# Patient Record
Sex: Female | Born: 1940 | Race: White | Hispanic: No | Marital: Married | Smoking: Never smoker
Health system: Southern US, Community
[De-identification: ages and names within clinical notes are randomized; demographics above are authoritative.]

## PROBLEM LIST (undated history)

## (undated) DIAGNOSIS — J309 Allergic rhinitis, unspecified: Secondary | ICD-10-CM

## (undated) DIAGNOSIS — Z87448 Personal history of other diseases of urinary system: Secondary | ICD-10-CM

## (undated) DIAGNOSIS — R519 Headache, unspecified: Secondary | ICD-10-CM

## (undated) DIAGNOSIS — F419 Anxiety disorder, unspecified: Secondary | ICD-10-CM

## (undated) DIAGNOSIS — I10 Essential (primary) hypertension: Secondary | ICD-10-CM

## (undated) DIAGNOSIS — Z973 Presence of spectacles and contact lenses: Secondary | ICD-10-CM

## (undated) DIAGNOSIS — Z8709 Personal history of other diseases of the respiratory system: Secondary | ICD-10-CM

## (undated) DIAGNOSIS — H269 Unspecified cataract: Secondary | ICD-10-CM

## (undated) DIAGNOSIS — K219 Gastro-esophageal reflux disease without esophagitis: Secondary | ICD-10-CM

## (undated) DIAGNOSIS — C801 Malignant (primary) neoplasm, unspecified: Secondary | ICD-10-CM

## (undated) DIAGNOSIS — J449 Chronic obstructive pulmonary disease, unspecified: Secondary | ICD-10-CM

## (undated) DIAGNOSIS — M1990 Unspecified osteoarthritis, unspecified site: Secondary | ICD-10-CM

## (undated) DIAGNOSIS — F32A Depression, unspecified: Secondary | ICD-10-CM

## (undated) DIAGNOSIS — M8580 Other specified disorders of bone density and structure, unspecified site: Secondary | ICD-10-CM

## (undated) DIAGNOSIS — K5900 Constipation, unspecified: Secondary | ICD-10-CM

## (undated) DIAGNOSIS — R42 Dizziness and giddiness: Secondary | ICD-10-CM

## (undated) DIAGNOSIS — E162 Hypoglycemia, unspecified: Secondary | ICD-10-CM

## (undated) DIAGNOSIS — J45909 Unspecified asthma, uncomplicated: Secondary | ICD-10-CM

## (undated) DIAGNOSIS — N189 Chronic kidney disease, unspecified: Secondary | ICD-10-CM

## (undated) DIAGNOSIS — R0600 Dyspnea, unspecified: Secondary | ICD-10-CM

## (undated) DIAGNOSIS — R51 Headache: Secondary | ICD-10-CM

## (undated) DIAGNOSIS — S83209A Unspecified tear of unspecified meniscus, current injury, unspecified knee, initial encounter: Secondary | ICD-10-CM

## (undated) DIAGNOSIS — F329 Major depressive disorder, single episode, unspecified: Secondary | ICD-10-CM

## (undated) HISTORY — PX: EYE SURGERY: SHX253

## (undated) HISTORY — PX: CARPAL TUNNEL RELEASE: SHX101

## (undated) HISTORY — PX: OTHER SURGICAL HISTORY: SHX169

## (undated) HISTORY — PX: ABDOMINAL HYSTERECTOMY: SHX81

## (undated) HISTORY — PX: BLEPHAROPLASTY: SUR158

## (undated) HISTORY — PX: BASAL CELL CARCINOMA EXCISION: SHX1214

## (undated) HISTORY — PX: COLONOSCOPY: SHX174

## (undated) HISTORY — PX: TONSILLECTOMY: SUR1361

## (undated) HISTORY — PX: NASAL SINUS SURGERY: SHX719

---

## 2004-08-03 ENCOUNTER — Ambulatory Visit: Payer: Self-pay | Admitting: Internal Medicine

## 2005-08-23 ENCOUNTER — Ambulatory Visit: Payer: Self-pay | Admitting: Internal Medicine

## 2005-11-16 ENCOUNTER — Ambulatory Visit: Payer: Self-pay | Admitting: Gastroenterology

## 2006-08-27 ENCOUNTER — Ambulatory Visit: Payer: Self-pay | Admitting: Internal Medicine

## 2007-09-09 ENCOUNTER — Ambulatory Visit: Payer: Self-pay | Admitting: Internal Medicine

## 2008-09-17 ENCOUNTER — Ambulatory Visit: Payer: Self-pay | Admitting: Internal Medicine

## 2009-09-28 ENCOUNTER — Ambulatory Visit: Payer: Self-pay | Admitting: Internal Medicine

## 2010-10-27 ENCOUNTER — Ambulatory Visit: Payer: Self-pay | Admitting: Internal Medicine

## 2011-10-31 ENCOUNTER — Ambulatory Visit: Payer: Self-pay

## 2012-11-18 ENCOUNTER — Ambulatory Visit: Payer: Self-pay

## 2013-12-17 ENCOUNTER — Ambulatory Visit: Payer: Self-pay

## 2014-04-21 DIAGNOSIS — G8929 Other chronic pain: Secondary | ICD-10-CM | POA: Insufficient documentation

## 2014-04-21 DIAGNOSIS — M150 Primary generalized (osteo)arthritis: Secondary | ICD-10-CM | POA: Insufficient documentation

## 2014-12-22 ENCOUNTER — Other Ambulatory Visit: Payer: Self-pay | Admitting: Internal Medicine

## 2014-12-22 DIAGNOSIS — J45909 Unspecified asthma, uncomplicated: Secondary | ICD-10-CM | POA: Insufficient documentation

## 2014-12-22 DIAGNOSIS — Z1239 Encounter for other screening for malignant neoplasm of breast: Secondary | ICD-10-CM

## 2014-12-31 ENCOUNTER — Ambulatory Visit
Admission: RE | Admit: 2014-12-31 | Discharge: 2014-12-31 | Disposition: A | Discharge status: Home / Self Care | Payer: Medicare PPO | Source: Ambulatory Visit | Attending: Internal Medicine | Admitting: Internal Medicine

## 2014-12-31 ENCOUNTER — Other Ambulatory Visit: Payer: Self-pay | Admitting: Internal Medicine

## 2014-12-31 DIAGNOSIS — Z1231 Encounter for screening mammogram for malignant neoplasm of breast: Secondary | ICD-10-CM

## 2014-12-31 DIAGNOSIS — Z1239 Encounter for other screening for malignant neoplasm of breast: Secondary | ICD-10-CM

## 2015-03-30 DIAGNOSIS — G8929 Other chronic pain: Secondary | ICD-10-CM | POA: Insufficient documentation

## 2015-11-28 ENCOUNTER — Other Ambulatory Visit: Payer: Self-pay | Admitting: Internal Medicine

## 2015-11-28 DIAGNOSIS — Z1239 Encounter for other screening for malignant neoplasm of breast: Secondary | ICD-10-CM

## 2016-01-04 ENCOUNTER — Ambulatory Visit: Payer: Medicare PPO

## 2016-01-31 ENCOUNTER — Ambulatory Visit
Admission: RE | Admit: 2016-01-31 | Discharge: 2016-01-31 | Disposition: A | Discharge status: Home / Self Care | Payer: Medicare Other | Source: Ambulatory Visit | Attending: Internal Medicine | Admitting: Internal Medicine

## 2016-01-31 DIAGNOSIS — Z1231 Encounter for screening mammogram for malignant neoplasm of breast: Secondary | ICD-10-CM | POA: Diagnosis not present

## 2016-01-31 DIAGNOSIS — Z1239 Encounter for other screening for malignant neoplasm of breast: Secondary | ICD-10-CM

## 2016-02-28 DIAGNOSIS — N289 Disorder of kidney and ureter, unspecified: Secondary | ICD-10-CM | POA: Insufficient documentation

## 2016-02-28 DIAGNOSIS — F418 Other specified anxiety disorders: Secondary | ICD-10-CM | POA: Insufficient documentation

## 2016-05-31 ENCOUNTER — Other Ambulatory Visit: Payer: Self-pay | Admitting: Neurological Surgery

## 2016-06-14 ENCOUNTER — Encounter (HOSPITAL_COMMUNITY): Payer: Self-pay

## 2016-06-14 ENCOUNTER — Encounter (HOSPITAL_COMMUNITY)
Admission: RE | Admit: 2016-06-14 | Discharge: 2016-06-14 | Disposition: A | Discharge status: Home / Self Care | Payer: Medicare Other | Source: Ambulatory Visit | Attending: Neurological Surgery | Admitting: Neurological Surgery

## 2016-06-14 DIAGNOSIS — Z01812 Encounter for preprocedural laboratory examination: Secondary | ICD-10-CM | POA: Insufficient documentation

## 2016-06-14 DIAGNOSIS — H269 Unspecified cataract: Secondary | ICD-10-CM | POA: Diagnosis not present

## 2016-06-14 DIAGNOSIS — E162 Hypoglycemia, unspecified: Secondary | ICD-10-CM | POA: Insufficient documentation

## 2016-06-14 DIAGNOSIS — I10 Essential (primary) hypertension: Secondary | ICD-10-CM | POA: Insufficient documentation

## 2016-06-14 DIAGNOSIS — J45909 Unspecified asthma, uncomplicated: Secondary | ICD-10-CM | POA: Diagnosis not present

## 2016-06-14 HISTORY — DX: Presence of spectacles and contact lenses: Z97.3

## 2016-06-14 HISTORY — DX: Hypoglycemia, unspecified: E16.2

## 2016-06-14 HISTORY — DX: Unspecified osteoarthritis, unspecified site: M19.90

## 2016-06-14 HISTORY — DX: Headache: R51

## 2016-06-14 HISTORY — DX: Unspecified asthma, uncomplicated: J45.909

## 2016-06-14 HISTORY — DX: Headache, unspecified: R51.9

## 2016-06-14 HISTORY — DX: Personal history of other diseases of the respiratory system: Z87.09

## 2016-06-14 HISTORY — DX: Unspecified cataract: H26.9

## 2016-06-14 HISTORY — DX: Essential (primary) hypertension: I10

## 2016-06-14 LAB — BASIC METABOLIC PANEL
ANION GAP: 8 (ref 5–15)
BUN: 14 mg/dL (ref 6–20)
CALCIUM: 9.7 mg/dL (ref 8.9–10.3)
CO2: 25 mmol/L (ref 22–32)
Chloride: 104 mmol/L (ref 101–111)
Creatinine, Ser: 1.01 mg/dL — ABNORMAL HIGH (ref 0.44–1.00)
GFR, EST NON AFRICAN AMERICAN: 53 mL/min — AB (ref >60)
Glucose, Bld: 98 mg/dL (ref 65–99)
Potassium: 4.2 mmol/L (ref 3.5–5.1)
Sodium: 137 mmol/L (ref 135–145)

## 2016-06-14 LAB — CBC
HCT: 43.6 % (ref 36.0–46.0)
HEMOGLOBIN: 14.4 g/dL (ref 12.0–15.0)
MCH: 29.6 pg (ref 26.0–34.0)
MCHC: 33 g/dL (ref 30.0–36.0)
MCV: 89.5 fL (ref 78.0–100.0)
Platelets: 247 10*3/uL (ref 150–400)
RBC: 4.87 MIL/uL (ref 3.87–5.11)
RDW: 13.3 % (ref 11.5–15.5)
WBC: 6.7 10*3/uL (ref 4.0–10.5)

## 2016-06-14 LAB — ABO/RH: ABO/RH(D): O POS

## 2016-06-14 LAB — TYPE AND SCREEN
ABO/RH(D): O POS
ANTIBODY SCREEN: NEGATIVE

## 2016-06-14 LAB — SURGICAL PCR SCREEN
MRSA, PCR: NEGATIVE
Staphylococcus aureus: NEGATIVE

## 2016-06-14 NOTE — Pre-Procedure Instructions (Signed)
    SHYNIECE SCRIPTER  06/14/2016      [place], [place] - [address] [place] [address] Phone: [phone] Fax: [phone]    Your procedure is scheduled on Tuesday 06/26/2016   Report to [facility] Admitting at  [place].M.  Call this number if you have problems the morning of surgery:  [phone]   Remember:  Do not eat food or drink liquids after midnight. Discontinue taking all NSAIDS ( Aleve (naproxyn), Ibuprofen Advil or Motrin, or aspirin products, herbal medications, vitamin supplements and fish oils 7 days prior to your surgery.   Take these medicines the morning of surgery with A SIP OF WATER  Amlodipine, flonase, gabapentin, zanaflex,  And pain pill and albuterol as needed   Do not wear jewelry, make-up or nail polish.  Do not wear lotions, powders, or perfumes, or deoderant.  Do not shave 48 hours prior to surgery.  .  Do not bring valuables to the hospital.  [facility] is not responsible for any belongings or valuables.  Contacts, dentures or bridgework may not be worn into surgery.  Leave your suitcase in the car.  After surgery it may be brought to your room.  For patients admitted to the hospital, discharge time will be determined by your treatment team.  Patients discharged the day of surgery will not be allowed to drive home.   Name and phone number of your driver: Special instructions  Please read over the following fact sheets that you were given. Pain Booklet, Coughing and Deep Breathing, Blood Transfusion Information, MRSA Information and Surgical Site Infection Prevention

## 2016-06-14 NOTE — Progress Notes (Signed)
PCP- Dr. Glendon Axe Cardiologist - denies  EKG - pt. States she had an EKG in April - requested from Dr. Candiss Norse CXR - 04/05/16 - Care Everywhere  Echo/stress test/cardiac cath - pt denies  Patient denies chest pain and shortness of breath at PAT appointment.    *if EKG tracing is not received, will need EKG DOS*

## 2016-06-25 NOTE — Anesthesia Preprocedure Evaluation (Addendum)
Anesthesia Evaluation  Patient identified by MRN, date of birth, ID band Patient awake    Reviewed: Allergy & Precautions, H&P , NPO status , Patient's Chart, lab work & pertinent test results  History of Anesthesia Complications (+) PONV  Airway Mallampati: II  TM Distance: >3 FB Neck ROM: Full    Dental no notable dental hx. (+) Teeth Intact, Dental Advisory Given   Pulmonary asthma ,    Pulmonary exam normal breath sounds clear to auscultation       Cardiovascular Exercise Tolerance: Good hypertension, Pt. on medications  Rhythm:Regular Rate:Normal     Neuro/Psych  Headaches, negative psych ROS   GI/Hepatic negative GI ROS, Neg liver ROS,   Endo/Other  negative endocrine ROS  Renal/GU negative Renal ROS  negative genitourinary   Musculoskeletal  (+) Arthritis , Osteoarthritis,    Abdominal   Peds  Hematology negative hematology ROS (+)   Anesthesia Other Findings   Reproductive/Obstetrics negative OB ROS                            Anesthesia Physical Anesthesia Plan  ASA: II  Anesthesia Plan: General   Post-op Pain Management:    Induction: Intravenous  PONV Risk Score and Plan: 4 or greater and Ondansetron, Dexamethasone, Propofol, Midazolam and Scopolamine patch - Pre-op  Airway Management Planned: Oral ETT  Additional Equipment:   Intra-op Plan:   Post-operative Plan: Extubation in OR  Informed Consent: I have reviewed the patients History and Physical, chart, labs and discussed the procedure including the risks, benefits and alternatives for the proposed anesthesia with the patient or authorized representative who has indicated his/her understanding and acceptance.   Dental advisory given  Plan Discussed with: CRNA  Anesthesia Plan Comments:        Anesthesia Quick Evaluation

## 2016-06-26 ENCOUNTER — Ambulatory Visit (HOSPITAL_COMMUNITY)
Admission: RE | Disposition: A | Discharge status: Home / Self Care | Payer: Self-pay | Source: Ambulatory Visit | Attending: Neurological Surgery

## 2016-06-26 ENCOUNTER — Ambulatory Visit (HOSPITAL_COMMUNITY): Payer: Medicare Other | Admitting: Anesthesiology

## 2016-06-26 ENCOUNTER — Ambulatory Visit (HOSPITAL_COMMUNITY): Payer: Medicare Other

## 2016-06-26 ENCOUNTER — Encounter (HOSPITAL_COMMUNITY): Payer: Self-pay | Admitting: General Practice

## 2016-06-26 ENCOUNTER — Ambulatory Visit (HOSPITAL_COMMUNITY)
Admission: RE | Admit: 2016-06-26 | Discharge: 2016-06-28 | Disposition: A | Discharge status: Home / Self Care | Payer: Medicare Other | Source: Ambulatory Visit | Attending: Neurological Surgery | Admitting: Neurological Surgery

## 2016-06-26 DIAGNOSIS — Z419 Encounter for procedure for purposes other than remedying health state, unspecified: Secondary | ICD-10-CM

## 2016-06-26 DIAGNOSIS — I10 Essential (primary) hypertension: Secondary | ICD-10-CM | POA: Insufficient documentation

## 2016-06-26 DIAGNOSIS — M4722 Other spondylosis with radiculopathy, cervical region: Secondary | ICD-10-CM | POA: Diagnosis present

## 2016-06-26 DIAGNOSIS — M542 Cervicalgia: Secondary | ICD-10-CM | POA: Diagnosis present

## 2016-06-26 HISTORY — PX: ANTERIOR CERVICAL DECOMP/DISCECTOMY FUSION: SHX1161

## 2016-06-26 SURGERY — ANTERIOR CERVICAL DECOMPRESSION/DISCECTOMY FUSION 3 LEVELS
Anesthesia: General | Site: Spine Cervical

## 2016-06-26 MED ORDER — ONDANSETRON HCL 4 MG PO TABS: 4.0000 mg | ORAL_TABLET | Freq: Four times a day (QID) | ORAL | Status: DC | PRN

## 2016-06-26 MED ORDER — ONDANSETRON HCL 4 MG/2ML IJ SOLN: 4.0000 mg | Freq: Four times a day (QID) | INTRAMUSCULAR | Status: DC | PRN

## 2016-06-26 MED ORDER — BUPIVACAINE-EPINEPHRINE (PF) 0.5% -1:200000 IJ SOLN
INTRAMUSCULAR | Status: DC | PRN
  Administered 2016-06-26: 7.5 mL

## 2016-06-26 MED ORDER — FLUTICASONE PROPIONATE 50 MCG/ACT NA SUSP
1.0000 | Freq: Every day | NASAL | Status: DC
  Filled 2016-06-26: qty 16

## 2016-06-26 MED ORDER — DIAZEPAM 5 MG PO TABS
5.0000 mg | ORAL_TABLET | Freq: Four times a day (QID) | ORAL | Status: DC | PRN
  Administered 2016-06-27: 5 mg via ORAL
  Filled 2016-06-26: qty 1

## 2016-06-26 MED ORDER — 0.9 % SODIUM CHLORIDE (POUR BTL) OPTIME
TOPICAL | Status: DC | PRN
  Administered 2016-06-26: 1000 mL

## 2016-06-26 MED ORDER — THROMBIN 5000 UNITS EX SOLR
OROMUCOSAL | Status: DC | PRN
  Administered 2016-06-26 (×3): via TOPICAL

## 2016-06-26 MED ORDER — DOCUSATE SODIUM 100 MG PO CAPS
100.0000 mg | ORAL_CAPSULE | Freq: Two times a day (BID) | ORAL | Status: DC
  Administered 2016-06-26 – 2016-06-28 (×5): 100 mg via ORAL
  Filled 2016-06-26 (×5): qty 1

## 2016-06-26 MED ORDER — PROPOFOL 10 MG/ML IV BOLUS
INTRAVENOUS | Status: AC
  Filled 2016-06-26: qty 20

## 2016-06-26 MED ORDER — MENTHOL 3 MG MT LOZG
1.0000 | LOZENGE | OROMUCOSAL | Status: DC | PRN
  Filled 2016-06-26: qty 9

## 2016-06-26 MED ORDER — MIDAZOLAM HCL 5 MG/5ML IJ SOLN
INTRAMUSCULAR | Status: DC | PRN
  Administered 2016-06-26: 2 mg via INTRAVENOUS

## 2016-06-26 MED ORDER — METHOCARBAMOL 750 MG PO TABS: 750.0000 mg | ORAL_TABLET | Freq: Four times a day (QID) | ORAL | Status: DC

## 2016-06-26 MED ORDER — THROMBIN 5000 UNITS EX SOLR
CUTANEOUS | Status: AC
  Filled 2016-06-26: qty 5000

## 2016-06-26 MED ORDER — SODIUM CHLORIDE 0.9% FLUSH
3.0000 mL | Freq: Two times a day (BID) | INTRAVENOUS | Status: DC
  Administered 2016-06-26 – 2016-06-27 (×3): 3 mL via INTRAVENOUS

## 2016-06-26 MED ORDER — ALBUMIN HUMAN 5 % IV SOLN
INTRAVENOUS | Status: AC
  Filled 2016-06-26: qty 250

## 2016-06-26 MED ORDER — CHLORHEXIDINE GLUCONATE CLOTH 2 % EX PADS: 6.0000 | MEDICATED_PAD | Freq: Once | CUTANEOUS | Status: DC

## 2016-06-26 MED ORDER — LORATADINE 10 MG PO TABS
10.0000 mg | ORAL_TABLET | Freq: Every day | ORAL | Status: DC
  Administered 2016-06-27 – 2016-06-28 (×2): 10 mg via ORAL
  Filled 2016-06-26 (×2): qty 1

## 2016-06-26 MED ORDER — EPHEDRINE SULFATE 50 MG/ML IJ SOLN
INTRAMUSCULAR | Status: DC | PRN
  Administered 2016-06-26: 20 mg via INTRAVENOUS
  Administered 2016-06-26: 10 mg via INTRAVENOUS

## 2016-06-26 MED ORDER — DEXAMETHASONE SODIUM PHOSPHATE 10 MG/ML IJ SOLN
INTRAMUSCULAR | Status: DC | PRN
  Administered 2016-06-26: 4 mg via INTRAVENOUS
  Administered 2016-06-26: 10 mg via INTRAVENOUS

## 2016-06-26 MED ORDER — GABAPENTIN 300 MG PO CAPS
600.0000 mg | ORAL_CAPSULE | Freq: Three times a day (TID) | ORAL | Status: DC
  Administered 2016-06-26 – 2016-06-28 (×5): 600 mg via ORAL
  Filled 2016-06-26 (×5): qty 2

## 2016-06-26 MED ORDER — SUGAMMADEX SODIUM 200 MG/2ML IV SOLN
INTRAVENOUS | Status: DC | PRN
  Administered 2016-06-26: 150 mg via INTRAVENOUS

## 2016-06-26 MED ORDER — SODIUM CHLORIDE 0.9 % IV SOLN
INTRAVENOUS | Status: DC
  Administered 2016-06-26: 13:00:00 via INTRAVENOUS

## 2016-06-26 MED ORDER — THROMBIN 20000 UNITS EX SOLR
CUTANEOUS | Status: DC | PRN
  Administered 2016-06-26: 09:00:00 via TOPICAL

## 2016-06-26 MED ORDER — PHENOL 1.4 % MT LIQD: 1.0000 | OROMUCOSAL | Status: DC | PRN

## 2016-06-26 MED ORDER — FLEET ENEMA 7-19 GM/118ML RE ENEM: 1.0000 | ENEMA | Freq: Once | RECTAL | Status: DC | PRN

## 2016-06-26 MED ORDER — LACTATED RINGERS IV SOLN
INTRAVENOUS | Status: DC | PRN
  Administered 2016-06-26 (×2): via INTRAVENOUS

## 2016-06-26 MED ORDER — CEFAZOLIN SODIUM-DEXTROSE 1-4 GM/50ML-% IV SOLN
1.0000 g | Freq: Three times a day (TID) | INTRAVENOUS | Status: DC
  Administered 2016-06-26 – 2016-06-27 (×5): 1 g via INTRAVENOUS
  Filled 2016-06-26 (×4): qty 50

## 2016-06-26 MED ORDER — ACETAMINOPHEN 500 MG PO TABS
1000.0000 mg | ORAL_TABLET | Freq: Four times a day (QID) | ORAL | Status: DC
  Administered 2016-06-26 – 2016-06-27 (×5): 1000 mg via ORAL
  Filled 2016-06-26 (×5): qty 2

## 2016-06-26 MED ORDER — LIDOCAINE-EPINEPHRINE 2 %-1:100000 IJ SOLN
INTRAMUSCULAR | Status: AC
  Filled 2016-06-26: qty 1

## 2016-06-26 MED ORDER — ROCURONIUM BROMIDE 10 MG/ML (PF) SYRINGE
PREFILLED_SYRINGE | INTRAVENOUS | Status: AC
  Filled 2016-06-26: qty 5

## 2016-06-26 MED ORDER — OXYCODONE HCL ER 20 MG PO T12A
20.0000 mg | EXTENDED_RELEASE_TABLET | Freq: Two times a day (BID) | ORAL | Status: DC
  Administered 2016-06-26: 20 mg via ORAL
  Filled 2016-06-26: qty 1

## 2016-06-26 MED ORDER — HYDROMORPHONE HCL 1 MG/ML IJ SOLN
INTRAMUSCULAR | Status: AC
  Filled 2016-06-26: qty 0.5

## 2016-06-26 MED ORDER — PROPOFOL 10 MG/ML IV BOLUS
INTRAVENOUS | Status: DC | PRN
Start: 2016-06-26 — End: 2016-06-26
  Administered 2016-06-26: 110 mg via INTRAVENOUS

## 2016-06-26 MED ORDER — FENTANYL CITRATE (PF) 100 MCG/2ML IJ SOLN
INTRAMUSCULAR | Status: DC | PRN
  Administered 2016-06-26 (×2): 50 ug via INTRAVENOUS
  Administered 2016-06-26 (×2): 25 ug via INTRAVENOUS

## 2016-06-26 MED ORDER — BUPIVACAINE-EPINEPHRINE (PF) 0.5% -1:200000 IJ SOLN
INTRAMUSCULAR | Status: AC
  Filled 2016-06-26: qty 30

## 2016-06-26 MED ORDER — VITAMIN D 1000 UNITS PO TABS
1000.0000 [IU] | ORAL_TABLET | Freq: Every day | ORAL | Status: DC
  Administered 2016-06-27 – 2016-06-28 (×2): 1000 [IU] via ORAL
  Filled 2016-06-26 (×2): qty 1

## 2016-06-26 MED ORDER — THROMBIN 20000 UNITS EX SOLR
CUTANEOUS | Status: AC
  Filled 2016-06-26: qty 20000

## 2016-06-26 MED ORDER — PANTOPRAZOLE SODIUM 40 MG IV SOLR: 40.0000 mg | Freq: Every day | INTRAVENOUS | Status: DC

## 2016-06-26 MED ORDER — ALBUMIN HUMAN 5 % IV SOLN
12.5000 g | Freq: Once | INTRAVENOUS | Status: AC
  Administered 2016-06-26: 12.5 g via INTRAVENOUS

## 2016-06-26 MED ORDER — METHOCARBAMOL 750 MG PO TABS
750.0000 mg | ORAL_TABLET | Freq: Four times a day (QID) | ORAL | Status: DC
  Administered 2016-06-26: 750 mg via ORAL
  Filled 2016-06-26: qty 1

## 2016-06-26 MED ORDER — AMLODIPINE BESYLATE 5 MG PO TABS
5.0000 mg | ORAL_TABLET | Freq: Every day | ORAL | Status: DC
  Filled 2016-06-26 (×2): qty 1

## 2016-06-26 MED ORDER — CEFAZOLIN SODIUM-DEXTROSE 2-4 GM/100ML-% IV SOLN
INTRAVENOUS | Status: AC
  Filled 2016-06-26: qty 100

## 2016-06-26 MED ORDER — ROCURONIUM BROMIDE 100 MG/10ML IV SOLN
INTRAVENOUS | Status: DC | PRN
  Administered 2016-06-26: 50 mg via INTRAVENOUS
  Administered 2016-06-26 (×4): 10 mg via INTRAVENOUS

## 2016-06-26 MED ORDER — ALBUTEROL SULFATE (2.5 MG/3ML) 0.083% IN NEBU
3.0000 mL | INHALATION_SOLUTION | RESPIRATORY_TRACT | Status: DC | PRN
  Administered 2016-06-27: 3 mL via RESPIRATORY_TRACT
  Filled 2016-06-26: qty 3

## 2016-06-26 MED ORDER — SODIUM CHLORIDE 0.9 % IR SOLN
Status: DC | PRN
  Administered 2016-06-26: 09:00:00

## 2016-06-26 MED ORDER — METHOCARBAMOL 750 MG PO TABS
750.0000 mg | ORAL_TABLET | Freq: Four times a day (QID) | ORAL | Status: DC
  Administered 2016-06-27 – 2016-06-28 (×4): 750 mg via ORAL
  Filled 2016-06-26 (×4): qty 1

## 2016-06-26 MED ORDER — MIDAZOLAM HCL 2 MG/2ML IJ SOLN
INTRAMUSCULAR | Status: AC
  Filled 2016-06-26: qty 2

## 2016-06-26 MED ORDER — DEXAMETHASONE SODIUM PHOSPHATE 4 MG/ML IJ SOLN
2.0000 mg | Freq: Three times a day (TID) | INTRAMUSCULAR | Status: AC
  Administered 2016-06-26 – 2016-06-27 (×3): 2 mg via INTRAVENOUS
  Filled 2016-06-26 (×3): qty 1

## 2016-06-26 MED ORDER — CEFAZOLIN SODIUM-DEXTROSE 2-4 GM/100ML-% IV SOLN
2.0000 g | INTRAVENOUS | Status: AC
  Administered 2016-06-26: 2 g via INTRAVENOUS

## 2016-06-26 MED ORDER — CELECOXIB 200 MG PO CAPS
200.0000 mg | ORAL_CAPSULE | Freq: Two times a day (BID) | ORAL | Status: DC
  Administered 2016-06-26 – 2016-06-28 (×5): 200 mg via ORAL
  Filled 2016-06-26 (×5): qty 1

## 2016-06-26 MED ORDER — SCOPOLAMINE 1 MG/3DAYS TD PT72
MEDICATED_PATCH | TRANSDERMAL | Status: AC
  Filled 2016-06-26: qty 1

## 2016-06-26 MED ORDER — BISACODYL 10 MG RE SUPP: 10.0000 mg | Freq: Every day | RECTAL | Status: DC | PRN

## 2016-06-26 MED ORDER — LIDOCAINE HCL (CARDIAC) 20 MG/ML IV SOLN
INTRAVENOUS | Status: DC | PRN
  Administered 2016-06-26: 60 mg via INTRAVENOUS

## 2016-06-26 MED ORDER — FENTANYL CITRATE (PF) 250 MCG/5ML IJ SOLN
INTRAMUSCULAR | Status: AC
  Filled 2016-06-26: qty 5

## 2016-06-26 MED ORDER — LIDOCAINE-EPINEPHRINE 2 %-1:100000 IJ SOLN
INTRAMUSCULAR | Status: DC | PRN
Start: 2016-06-26 — End: 2016-06-26
  Administered 2016-06-26: 7.5 mL

## 2016-06-26 MED ORDER — OXYCODONE HCL 5 MG PO TABS
5.0000 mg | ORAL_TABLET | ORAL | Status: DC | PRN
  Administered 2016-06-26 – 2016-06-27 (×3): 10 mg via ORAL
  Filled 2016-06-26 (×3): qty 2

## 2016-06-26 MED ORDER — PANTOPRAZOLE SODIUM 40 MG PO TBEC
40.0000 mg | DELAYED_RELEASE_TABLET | Freq: Every day | ORAL | Status: DC
Start: 2016-06-26 — End: 2016-06-28
  Administered 2016-06-26 – 2016-06-27 (×2): 40 mg via ORAL
  Filled 2016-06-26 (×2): qty 1

## 2016-06-26 MED ORDER — PHENYLEPHRINE HCL 10 MG/ML IJ SOLN
INTRAVENOUS | Status: DC | PRN
  Administered 2016-06-26: 50 ug/min via INTRAVENOUS

## 2016-06-26 MED ORDER — HYDROXYZINE HCL 50 MG/ML IM SOLN
50.0000 mg | Freq: Four times a day (QID) | INTRAMUSCULAR | Status: DC | PRN
  Administered 2016-06-26: 50 mg via INTRAMUSCULAR
  Filled 2016-06-26: qty 1

## 2016-06-26 MED ORDER — ZOLPIDEM TARTRATE 5 MG PO TABS: 5.0000 mg | ORAL_TABLET | Freq: Every evening | ORAL | Status: DC | PRN

## 2016-06-26 MED ORDER — SCOPOLAMINE 1 MG/3DAYS TD PT72
MEDICATED_PATCH | TRANSDERMAL | Status: DC | PRN
  Administered 2016-06-26: 1 via TRANSDERMAL

## 2016-06-26 MED ORDER — SODIUM CHLORIDE 0.9% FLUSH: 3.0000 mL | INTRAVENOUS | Status: DC | PRN

## 2016-06-26 MED ORDER — HYDROMORPHONE HCL 1 MG/ML IJ SOLN
0.2500 mg | INTRAMUSCULAR | Status: DC | PRN
  Administered 2016-06-26: 0.25 mg via INTRAVENOUS

## 2016-06-26 MED ORDER — SENNA 8.6 MG PO TABS
1.0000 | ORAL_TABLET | Freq: Two times a day (BID) | ORAL | Status: DC
  Administered 2016-06-26 – 2016-06-27 (×2): 8.6 mg via ORAL
  Filled 2016-06-26 (×2): qty 1

## 2016-06-26 MED ORDER — LIDOCAINE 2% (20 MG/ML) 5 ML SYRINGE
INTRAMUSCULAR | Status: AC
  Filled 2016-06-26: qty 5

## 2016-06-26 SURGICAL SUPPLY — 70 items
BIT DRILL INVIZIA (BIT) ×1 IMPLANT
BONE EQUIVA 5CC (Bone Implant) ×3 IMPLANT
BUR MATCHSTICK NEURO 3.0 LAGG (BURR) ×3 IMPLANT
CANISTER SUCT 3000ML PPV (MISCELLANEOUS) ×3 IMPLANT
CARTRIDGE OIL MAESTRO DRILL (MISCELLANEOUS) ×1 IMPLANT
CHLORAPREP W/TINT 26ML (MISCELLANEOUS) ×3 IMPLANT
DERMABOND ADVANCED (GAUZE/BANDAGES/DRESSINGS) ×2
DERMABOND ADVANCED .7 DNX12 (GAUZE/BANDAGES/DRESSINGS) ×1 IMPLANT
DIFFUSER DRILL AIR PNEUMATIC (MISCELLANEOUS) ×3 IMPLANT
DRAPE C-ARM 42X72 X-RAY (DRAPES) ×6 IMPLANT
DRAPE HALF SHEET 40X57 (DRAPES) ×3 IMPLANT
DRAPE LAPAROTOMY 100X72 PEDS (DRAPES) ×3 IMPLANT
DRAPE MICROSCOPE LEICA (MISCELLANEOUS) ×3 IMPLANT
DRAPE POUCH INSTRU U-SHP 10X18 (DRAPES) ×3 IMPLANT
DRAPE SHEET LG 3/4 BI-LAMINATE (DRAPES) ×3 IMPLANT
DRILL BIT INVIZIA (BIT) ×3
DRSG OPSITE 4X5.5 SM (GAUZE/BANDAGES/DRESSINGS) ×3 IMPLANT
ELECT COATED BLADE 2.86 ST (ELECTRODE) ×3 IMPLANT
ELECT REM PT RETURN 9FT ADLT (ELECTROSURGICAL) ×3
ELECTRODE REM PT RTRN 9FT ADLT (ELECTROSURGICAL) ×1 IMPLANT
EVACUATOR 1/8 PVC DRAIN (DRAIN) IMPLANT
GAUZE SPONGE 4X4 12PLY STRL (GAUZE/BANDAGES/DRESSINGS) IMPLANT
GAUZE SPONGE 4X4 16PLY XRAY LF (GAUZE/BANDAGES/DRESSINGS) IMPLANT
GLOVE BIO SURGEON STRL SZ7 (GLOVE) IMPLANT
GLOVE BIOGEL PI IND STRL 7.0 (GLOVE) IMPLANT
GLOVE BIOGEL PI IND STRL 7.5 (GLOVE) ×3 IMPLANT
GLOVE BIOGEL PI IND STRL 8.5 (GLOVE) ×1 IMPLANT
GLOVE BIOGEL PI INDICATOR 7.0 (GLOVE)
GLOVE BIOGEL PI INDICATOR 7.5 (GLOVE) ×6
GLOVE BIOGEL PI INDICATOR 8.5 (GLOVE) ×2
GLOVE ECLIPSE 8.0 STRL XLNG CF (GLOVE) ×3 IMPLANT
GLOVE SS BIOGEL STRL SZ 7.5 (GLOVE) ×3 IMPLANT
GLOVE SUPERSENSE BIOGEL SZ 7.5 (GLOVE) ×6
GOWN SPEC L3 XXLG W/TWL (GOWN DISPOSABLE) ×3 IMPLANT
GOWN STRL REUS W/ TWL LRG LVL3 (GOWN DISPOSABLE) ×1 IMPLANT
GOWN STRL REUS W/ TWL XL LVL3 (GOWN DISPOSABLE) ×1 IMPLANT
GOWN STRL REUS W/TWL LRG LVL3 (GOWN DISPOSABLE) ×2
GOWN STRL REUS W/TWL XL LVL3 (GOWN DISPOSABLE) ×2
HEMOSTAT POWDER KIT SURGIFOAM (HEMOSTASIS) ×9 IMPLANT
INTERBODY TM 11X14X5-7DEG ANG (Metal Cage) ×9 IMPLANT
INTERBODY TM 11X14X7-7DEG ANG (Metal Cage) ×3 IMPLANT
KIT BASIN OR (CUSTOM PROCEDURE TRAY) ×3 IMPLANT
KIT ROOM TURNOVER OR (KITS) ×3 IMPLANT
NEEDLE HYPO 21X1.5 SAFETY (NEEDLE) ×3 IMPLANT
NEEDLE SPNL 18GX3.5 QUINCKE PK (NEEDLE) ×3 IMPLANT
NS IRRIG 1000ML POUR BTL (IV SOLUTION) ×3 IMPLANT
OIL CARTRIDGE MAESTRO DRILL (MISCELLANEOUS) ×3
PACK LAMINECTOMY NEURO (CUSTOM PROCEDURE TRAY) ×3 IMPLANT
PAD ARMBOARD 7.5X6 YLW CONV (MISCELLANEOUS) ×6 IMPLANT
PATTIES SURGICAL .5X1.5 (GAUZE/BANDAGES/DRESSINGS) ×3 IMPLANT
PIN DISTRACTION 14MM (PIN) ×6 IMPLANT
PIN THREADED TEMP FIX INVIZIA (PIN) ×3 IMPLANT
PLATE INVIZIA 4 LEV 76MM (Plate) ×3 IMPLANT
RUBBERBAND STERILE (MISCELLANEOUS) ×6 IMPLANT
SCREW 14MM (Screw) ×16 IMPLANT
SCREW BN 14X4.2XST VA NS (Screw) ×8 IMPLANT
SCREW FIXED ST 14MM (Screw) ×6 IMPLANT
SPONGE INTESTINAL PEANUT (DISPOSABLE) ×3 IMPLANT
SPONGE SURGIFOAM ABS GEL 100 (HEMOSTASIS) ×3 IMPLANT
STAPLER VISISTAT 35W (STAPLE) ×3 IMPLANT
STOCKINETTE 6  STRL (DRAPES) ×2
STOCKINETTE 6 STRL (DRAPES) ×1 IMPLANT
SUT STRATAFIX MNCRL+ 3-0 PS-2 (SUTURE)
SUT STRATAFIX MONOCRYL 3-0 (SUTURE)
SUT VIC AB 3-0 SH 8-18 (SUTURE) ×3 IMPLANT
SUTURE STRATFX MNCRL+ 3-0 PS-2 (SUTURE) IMPLANT
TOWEL GREEN STERILE (TOWEL DISPOSABLE) ×3 IMPLANT
TOWEL GREEN STERILE FF (TOWEL DISPOSABLE) ×6 IMPLANT
TRAY FOLEY CATH SILVER 16FR (SET/KITS/TRAYS/PACK) ×3 IMPLANT
WATER STERILE IRR 1000ML POUR (IV SOLUTION) ×3 IMPLANT

## 2016-06-26 NOTE — Progress Notes (Signed)
Orthopedic Tech Progress Note Patient Details:  Christine Ballard [birth year] [id number]  Ortho Devices Type of Ortho Device: Soft collar Ortho Device/Splint Interventions: Application   [place] Pink 06/26/2016, 12:35 PM

## 2016-06-26 NOTE — Anesthesia Postprocedure Evaluation (Signed)
Anesthesia Post Note  Patient: Elon Spanner Ritzel  Procedure(s) Performed: Procedure(s) (LRB): Cervical Two-Three, Cervical Three-Four, Cervical Four-Five, Cervical Five-Six Anterior Discectomy with Fusion and Plate Fixation (N/A)     Patient location during evaluation: PACU Anesthesia Type: General Level of consciousness: awake and alert Pain management: pain level controlled Vital Signs Assessment: post-procedure vital signs reviewed and stable Respiratory status: spontaneous breathing, nonlabored ventilation, respiratory function stable and patient connected to nasal cannula oxygen Cardiovascular status: blood pressure returned to baseline and stable Postop Assessment: no signs of nausea or vomiting Anesthetic complications: no    Last Vitals:  Vitals:   06/26/16 1400 06/26/16 1415  BP: 110/66 121/62  Pulse: 68 67  Resp: 16   Temp:  36.7 C    Last Pain:  Vitals:   06/26/16 1345  TempSrc:   PainSc: Asleep                 Trisha Ken,W. EDMOND

## 2016-06-26 NOTE — Transfer of Care (Signed)
Immediate Anesthesia Transfer of Care Note  Patient: Elon Spanner Clenney  Procedure(s) Performed: Procedure(s) with comments: Cervical Two-Three, Cervical Three-Four, Cervical Four-Five, Cervical Five-Six Anterior Discectomy with Fusion and Plate Fixation (N/A) - C3-6 Anterior discectomy with fusion and plate fixation  Patient Location: PACU  Anesthesia Type:General  Level of Consciousness: awake  Airway & Oxygen Therapy: Patient Spontanous Breathing and Patient connected to nasal cannula oxygen  Post-op Assessment: Report given to RN and Post -op Vital signs reviewed and stable  Post vital signs: Reviewed and stable  Last Vitals:  Vitals:   06/26/16 0648  BP: (!) 142/65  Pulse: (!) 55  Resp: 18  Temp: 36.7 C    Last Pain:  Vitals:   06/26/16 0648  TempSrc: Oral  PainSc: 2       Patients Stated Pain Goal: 2 (44/92/01 0071)  Complications: No apparent anesthesia complications

## 2016-06-26 NOTE — Brief Op Note (Signed)
06/26/2016  11:39 AM  PATIENT:  Christine Ballard  76 y.o. female  PRE-OPERATIVE DIAGNOSIS:  Other spondylosis with radiculopathy, Cervical region  POST-OPERATIVE DIAGNOSIS:  Other spondylosis with radiculopathy, Cervical region  PROCEDURE:  Procedure(s) with comments: Cervical Two-Three, Cervical Three-Four, Cervical Four-Five, Cervical Five-Six Anterior Discectomy with Fusion and Plate Fixation (N/A) - C3-6 Anterior discectomy with fusion and plate fixation  SURGEON:  Surgeon(s) and Role:    * Everest Brod, Kevan Ny, MD - Primary    Kristeen Miss, MD - Assisting  PHYSICIAN ASSISTANT:   ASSISTANTS: Kristeen Miss, MD  ANESTHESIA:   general  EBL:  Total I/O In: 1000 [I.V.:1000] Out: 900 [Urine:900]  BLOOD ADMINISTERED:none  DRAINS: JP  LOCAL MEDICATIONS USED:  MARCAINE    and LIDOCAINE   SPECIMEN:  No Specimen  DISPOSITION OF SPECIMEN:  N/A  COUNTS:  YES  TOURNIQUET:  * No tourniquets in log *  DICTATION: .Dragon Dictation  PLAN OF CARE: Admit to inpatient   PATIENT DISPOSITION:  PACU - hemodynamically stable.   Delay start of Pharmacological VTE agent (>24hrs) due to surgical blood loss or risk of bleeding: yes

## 2016-06-26 NOTE — H&P (Signed)
CC:  No chief complaint on file. Neck and arm pain  HPI: Christine Ballard is a 76 y.o. female with cervical radiculopathy.  She complains of neck pain and left shoulder pain.  She has more left sided pain than right sided pain.    PMH: Past Medical History:  Diagnosis Date  . Allergy-induced asthma   . Arthritis   . Cataracts, bilateral    "small"  . Headache   . History of bronchitis    "about twice a year"  . Hypertension   . Hypoglycemia   . Wears glasses     PSH: Past Surgical History:  Procedure Laterality Date  . ABDOMINAL HYSTERECTOMY    . BASAL CELL CARCINOMA EXCISION     on back  . BLEPHAROPLASTY    . CARPAL TUNNEL RELEASE Bilateral   . COLONOSCOPY    . NASAL SINUS SURGERY    . TONSILLECTOMY      SH: Social History  Substance Use Topics  . Smoking status: Never Smoker  . Smokeless tobacco: Never Used  . Alcohol use No    MEDS: Prior to Admission medications   Medication Sig Start Date End Date Taking? Authorizing Provider  acetaminophen (TYLENOL) 650 MG CR tablet Take 650 mg by mouth every 8 (eight) hours as needed for pain.   Yes [provider]  amLODipine (NORVASC) 5 MG tablet TAKE 1 TABLET BY MOUTH DAILY. IF SYSTOLIC BP GREATER THAN 264 IN AFTERNOON TAKE EXTRA 1/2 TABLET. 08/19/15  Yes [provider]  cetirizine (ZYRTEC) 10 MG tablet Take 10 mg by mouth daily as needed for allergies.   Yes [provider]  Cholecalciferol (VITAMIN D-1000 MAX ST) 1000 units tablet Take 1,000 Units by mouth daily.    Yes [provider]  fluticasone (FLONASE) 50 MCG/ACT nasal spray Place 1 spray into the nose daily as needed for allergies.  08/19/15  Yes [provider]  gabapentin (NEURONTIN) 300 MG capsule Take 300 mg by mouth 3 (three) times daily.   Yes [provider]  HYDROcodone-acetaminophen (NORCO/VICODIN) 5-325 MG tablet Take 0.5 tablets by mouth 2 (two) times daily as needed for pain. 02/03/16  Yes [provider]  tiZANidine (ZANAFLEX) 2 MG tablet Take 2 mg by mouth every 8 (eight) hours as needed for muscle spasms.   Yes [provider]  albuterol (PROVENTIL HFA;VENTOLIN HFA) 108 (90 Base) MCG/ACT inhaler Inhale 2 puffs into the lungs every 4 (four) hours as needed for wheezing or shortness of breath.  11/28/15 11/27/16  [provider]    ALLERGY: Allergies  Allergen Reactions  . Bisacodyl Other (See Comments)    Extreme cramping.  . Ciprofloxacin Other (See Comments)    Muscle pain and stiffness  . Penicillins Rash    Has patient had a PCN reaction causing immediate rash, facial/tongue/throat swelling, SOB or lightheadedness with hypotension: No Has patient had a PCN reaction causing severe rash involving mucus membranes or skin necrosis: No Has patient had a PCN reaction that required hospitalization: No Has patient had a PCN reaction occurring within the last 10 years: No If all of the above answers are "NO", then may proceed with Cephalosporin use.   . Sulfa Antibiotics Rash    ROS: ROS  NEUROLOGIC EXAM: Awake, alert, oriented Memory and concentration grossly intact Speech fluent, appropriate CN grossly intact Motor exam: Upper Extremities Deltoid Bicep Tricep Grip  Right 5/5 5/5 5/5 5/5  Left 5/5 4/5 5/5 5/5   Lower Extremity IP  Quad PF DF EHL  Right 5/5 5/5 5/5 5/5 5/5  Left 5/5 5/5 5/5 5/5 5/5   Sensation grossly intact to LT  IMAGING: No new imaging  IMPRESSION: - 76 y.o. female with cervical spondylosis with radiculopathy.  Weakness as above.  PLAN: - C3-6 anterior discectomy with fusion and plate fixation - We have discussed the risks, benefits, and alternatives to surgery and she wishes to proceed.

## 2016-06-26 NOTE — Anesthesia Procedure Notes (Signed)
Procedure Name: Intubation Date/Time: 06/26/2016 7:44 AM Performed by: Manus Gunning, Remington Highbaugh J Pre-anesthesia Checklist: Patient identified, Emergency Drugs available, Suction available, Patient being monitored and Timeout performed Patient Re-evaluated:Patient Re-evaluated prior to inductionOxygen Delivery Method: Circle system utilized Preoxygenation: Pre-oxygenation with 100% oxygen Intubation Type: IV induction Ventilation: Mask ventilation without difficulty Laryngoscope Size: Glidescope Tube size: 7.0 mm Number of attempts: 1 Placement Confirmation: ETT inserted through vocal cords under direct vision,  positive ETCO2 and breath sounds checked- equal and bilateral Secured at: 21 cm Tube secured with: Tape Dental Injury: Teeth and Oropharynx as per pre-operative assessment

## 2016-06-27 ENCOUNTER — Encounter (HOSPITAL_COMMUNITY): Payer: Self-pay | Admitting: Neurological Surgery

## 2016-06-27 DIAGNOSIS — M4722 Other spondylosis with radiculopathy, cervical region: Secondary | ICD-10-CM | POA: Diagnosis not present

## 2016-06-27 MED ORDER — DEXAMETHASONE SODIUM PHOSPHATE 4 MG/ML IJ SOLN
4.0000 mg | Freq: Four times a day (QID) | INTRAMUSCULAR | Status: DC
  Administered 2016-06-27 (×3): 4 mg via INTRAVENOUS
  Filled 2016-06-27 (×2): qty 1

## 2016-06-27 MED ORDER — HYDROCODONE-ACETAMINOPHEN 5-325 MG PO TABS
1.0000 | ORAL_TABLET | ORAL | Status: DC | PRN
  Administered 2016-06-27: 2 via ORAL
  Administered 2016-06-27 – 2016-06-28 (×3): 1 via ORAL
  Filled 2016-06-27: qty 1
  Filled 2016-06-27: qty 2
  Filled 2016-06-27 (×2): qty 1

## 2016-06-27 MED ORDER — ACETAMINOPHEN 325 MG PO TABS: 650.0000 mg | ORAL_TABLET | ORAL | Status: DC | PRN

## 2016-06-27 MED FILL — Thrombin For Soln 5000 Unit: CUTANEOUS | Qty: 5000 | Status: AC

## 2016-06-27 NOTE — Evaluation (Signed)
Occupational Therapy Evaluation Patient Details Name: Christine Ballard MRN: [id number] DOB: [birth year] Today's Date: 06/27/2016    History of Present Illness Pt is a 76 y/o female who presents s/p C2-C6 ACDF on 06/26/16. No pertinent PMH noted.   Clinical Impression   PTA, pt was independent with ADL and functional mobility. She currently requires overall min assist for dressing and bathing tasks and min guard assist for toilet transfers. Educated pt and family concerning body mechanics and post-operative cervical precautions and they verbalize understanding. Pt educated concerning compensatory strategies to improve independence with ADL as well as log roll technique for bed mobility in preparation for ADL at EOB. She required frequent VC's to adhere to precautions during session and would benefit from continued OT services while admitted to maximize safety and independence post-acute D/C. Will continue to follow while admitted.     Follow Up Recommendations  No OT follow up;Supervision/Assistance - 24 hour    Equipment Recommendations  3 in 1 bedside commode    Recommendations for Other Services       Precautions / Restrictions Precautions Precautions: Fall;Cervical Precaution Comments: Handout reviewed and pt cued for precautions during functional mobility.  Required Braces or Orthoses: Cervical Brace Cervical Brace: Soft collar;At all times Restrictions Weight Bearing Restrictions: No      Mobility Bed Mobility Overal bed mobility: Needs Assistance Bed Mobility: Rolling;Sidelying to Sit;Sit to Sidelying Rolling: Supervision Sidelying to sit: Min guard     Sit to sidelying: Min assist General bed mobility comments: Min guard for raising trunk from bed and min assist to manage B LE back into bed and avoid twisting. VC's throughout for technique.   Transfers Overall transfer level: Needs assistance Equipment used: Rolling walker (2 wheeled) Transfers: Sit to/from Stand Sit  to Stand: Min guard         General transfer comment: Min guard assist to maintain stability during power up.     Balance Overall balance assessment: Needs assistance Sitting-balance support: No upper extremity supported;Feet supported Sitting balance-Leahy Scale: Fair     Standing balance support: No upper extremity supported;During functional activity Standing balance-Leahy Scale: Fair Standing balance comment: Able to statically stand without UE support at sink for ADL tasks.                           ADL either performed or assessed with clinical judgement   ADL Overall ADL's : Needs assistance/impaired Eating/Feeding: Set up;Sitting   Grooming: Supervision/safety;Standing Grooming Details (indicate cue type and reason): Educated on 2 cup method for oral care at sink.  Upper Body Bathing: Minimal assistance;Sitting   Lower Body Bathing: Minimal assistance;Sit to/from stand   Upper Body Dressing : Minimal assistance;Sitting   Lower Body Dressing: Minimal assistance;Sit to/from stand   Toilet Transfer: Min guard;Ambulation;RW   Toileting- Water quality scientist and Hygiene: Min guard;Sit to/from stand   Tub/ Shower Transfer: Min guard;Ambulation;Shower seat;Rolling walker   Functional mobility during ADLs: Min guard;Rolling walker General ADL Comments: Pt reports ability to swallow is improving. Pt educated on compensatory ADL strategies to maximize adherence to cervical precautions during ADL. Additionally educated on home modification to prevent falling and avoid reaching and bending.      Vision Patient Visual Report: No change from baseline Vision Assessment?: No apparent visual deficits     Perception     Praxis      Pertinent Vitals/Pain Pain Assessment: No/denies pain     Hand Dominance Right  Extremity/Trunk Assessment Upper Extremity Assessment Upper Extremity Assessment: LUE deficits/detail LUE Deficits / Details: Decreased strength  as compared to R with 4/5 strength grossly.    Lower Extremity Assessment Lower Extremity Assessment: Generalized weakness   Cervical / Trunk Assessment Cervical / Trunk Assessment: Other exceptions Cervical / Trunk Exceptions: s/p surgery   Communication Communication Communication: No difficulties   Cognition Arousal/Alertness: Awake/alert Behavior During Therapy: WFL for tasks assessed/performed Overall Cognitive Status: Within Functional Limits for tasks assessed                                     General Comments  Husband and sister present and engaged in session.     Exercises     Shoulder Instructions      Home Living Family/patient expects to be discharged to:: Private residence Living Arrangements: Spouse/significant other Available Help at Discharge: Family;Available 24 hours/day Type of Home: House Home Access: Stairs to enter [employer] of Steps: 4 Entrance Stairs-Rails: Left Home Layout: One level     Bathroom Shower/Tub: Walk-in shower;Door   [employer] Toilet: Handicapped height     Home Equipment: Shower seat          Prior Functioning/Environment Level of Independence: Independent                 OT Problem List: Decreased strength;Decreased activity tolerance;Impaired balance (sitting and/or standing);Decreased safety awareness;Decreased knowledge of use of DME or AE;Decreased knowledge of precautions      OT Treatment/Interventions: Self-care/ADL training;Therapeutic exercise;Energy conservation;DME and/or AE instruction;Therapeutic activities;Patient/family education;Balance training    OT Goals(Current goals can be found in the care plan section) Acute Rehab OT Goals Patient Stated Goal: Home tomorrow OT Goal Formulation: With patient/family Time For Goal Achievement: 07/11/16 Potential to Achieve Goals: Good ADL Goals Pt Will Perform Upper Body Dressing: with supervision;sitting Pt Will Perform Lower  Body Dressing: with min guard assist;sit to/from stand Pt Will Transfer to Toilet: with supervision;ambulating;bedside commode (BSC over toilet) Pt Will Perform Toileting - Clothing Manipulation and hygiene: with supervision;sit to/from stand Pt Will Perform Tub/Shower Transfer: Shower transfer;with supervision;shower seat;ambulating;rolling walker  OT Frequency: Min 2X/week   Barriers to D/C:            Co-evaluation              AM-PAC PT "6 Clicks" Daily Activity     Outcome Measure Help from another person eating meals?: None Help from another person taking care of personal grooming?: A Little Help from another person toileting, which includes using toliet, bedpan, or urinal?: A Little Help from another person bathing (including washing, rinsing, drying)?: A Little Help from another person to put on and taking off regular upper body clothing?: A Little Help from another person to put on and taking off regular lower body clothing?: A Little 6 Click Score: 19   End of Session Equipment Utilized During Treatment: Gait belt;Rolling walker Nurse Communication: Mobility status  Activity Tolerance: Patient tolerated treatment well Patient left: in bed;with call bell/phone within reach;with family/visitor present  OT Visit Diagnosis: Unsteadiness on feet (R26.81);Other abnormalities of gait and mobility (R26.89)                Time: 5462-7035 OT Time Calculation (min): 36 min Charges:  OT General Charges $OT Visit: 1 Procedure OT Evaluation $OT Eval Low Complexity: 1 Procedure OT Treatments $Self Care/Home Management : 8-22 mins G-Codes: OT G-codes **NOT  FOR INPATIENT CLASS** Functional Assessment Tool Used: Clinical judgement Functional Limitation: Self care Self Care Current Status (K5397): At least 20 percent but less than 40 percent impaired, limited or restricted Self Care Goal Status (Q7341): At least 1 percent but less than 20 percent impaired, limited or restricted    Norman Herrlich, [place] OTR/L  Pager: [place] 06/27/2016, 12:01 PM

## 2016-06-27 NOTE — Evaluation (Signed)
Physical Therapy Evaluation Patient Details Name: Christine Ballard MRN: [id number] DOB: [birth year] Today's Date: 06/27/2016   History of Present Illness  Pt is a 76 y/o female who presents s/p C2-C6 ACDF on 06/26/16. No pertinent PMH noted.  Clinical Impression  Pt admitted with above diagnosis. Pt currently with functional limitations due to the deficits listed below (see PT Problem List). At the time of PT eval pt was able to perform transfers and ambulation with gross min guard assist for balance support and safety. Pt will need to practice stairs next session with family present for education. Pt will benefit from skilled PT to increase their independence and safety with mobility to allow discharge to the venue listed below.       Follow Up Recommendations No PT follow up;Supervision for mobility/OOB    Equipment Recommendations  Rolling walker with 5" wheels    Recommendations for Other Services       Precautions / Restrictions Precautions Precautions: Fall;Cervical Precaution Comments: Handout reviewed and pt cued for precautions during functional mobility.  Required Braces or Orthoses: Cervical Brace Cervical Brace: Soft collar;At all times Restrictions Weight Bearing Restrictions: No      Mobility  Bed Mobility Overal bed mobility: Needs Assistance Bed Mobility: Rolling;Sidelying to Sit;Sit to Sidelying Rolling: Modified independent (Device/Increase time) Sidelying to sit: Min guard     Sit to sidelying: Min guard General bed mobility comments: Close guard for proper trunk positioning during sit<>sidelying. VC's for log roll technique.  Transfers Overall transfer level: Needs assistance Equipment used: Rolling walker (2 wheeled) Transfers: Sit to/from Stand Sit to Stand: Min guard         General transfer comment: Close guard for safety as pt powered up to full standing position.   Ambulation/Gait Ambulation/Gait assistance: Min guard Ambulation Distance  (Feet): 300 Feet Assistive device: Rolling walker (2 wheeled) Gait Pattern/deviations: Step-through pattern;Decreased stride length;Trunk flexed Gait velocity: Decreased Gait velocity interpretation: Below normal speed for age/gender General Gait Details: Pt required cues for hands on walker at all times and general safety awareness. Close guard provided for safety, and occasional assist for walker management when pt taking her hand off one side.   Stairs Stairs: Yes Stairs assistance: Min assist Stair Management: One rail Left;Step to pattern;Forwards Number of Stairs: 4 General stair comments: VC's for sequencing and general safety. Pt with rail use on L, HHA on R to ascend, and sideways to descend.   Wheelchair Mobility    Modified Rankin (Stroke Patients Only)       Balance Overall balance assessment: Needs assistance Sitting-balance support: No upper extremity supported;Feet supported Sitting balance-Leahy Scale: Fair     Standing balance support: No upper extremity supported;During functional activity Standing balance-Leahy Scale: Fair Standing balance comment: statically                             Pertinent Vitals/Pain Pain Assessment: No/denies pain    Home Living Family/patient expects to be discharged to:: Private residence Living Arrangements: Spouse/significant other Available Help at Discharge: Family;Available 24 hours/day Type of Home: House Home Access: Stairs to enter Entrance Stairs-Rails: Left Entrance Stairs-Number of Steps: 4 Home Layout: One level Home Equipment: Shower seat      Prior Function Level of Independence: Independent               Hand Dominance   Dominant Hand: Right    Extremity/Trunk Assessment   Upper Extremity Assessment Upper Extremity  Assessment: Defer to OT evaluation    Lower Extremity Assessment Lower Extremity Assessment: Generalized weakness    Cervical / Trunk Assessment Cervical / Trunk  Assessment: Other exceptions Cervical / Trunk Exceptions: s/p surgery  Communication   Communication: No difficulties  Cognition Arousal/Alertness: Awake/alert Behavior During Therapy: WFL for tasks assessed/performed Overall Cognitive Status: Within Functional Limits for tasks assessed                                        General Comments      Exercises     Assessment/Plan    PT Assessment Patient needs continued PT services  PT Problem List Decreased strength;Decreased range of motion;Decreased activity tolerance;Decreased balance;Decreased mobility;Decreased knowledge of use of DME;Decreased safety awareness;Decreased knowledge of precautions;Pain       PT Treatment Interventions DME instruction;Gait training;Stair training;Functional mobility training;Therapeutic activities;Therapeutic exercise;Neuromuscular re-education;Patient/family education    PT Goals (Current goals can be found in the Care Plan section)  Acute Rehab PT Goals Patient Stated Goal: Home tomorrow PT Goal Formulation: With patient/family Time For Goal Achievement: 07/04/16 Potential to Achieve Goals: Good    Frequency Min 5X/week   Barriers to discharge        Co-evaluation               AM-PAC PT "6 Clicks" Daily Activity  Outcome Measure Difficulty turning over in bed (including adjusting bedclothes, sheets and blankets)?: None Difficulty moving from lying on back to sitting on the side of the bed? : A Little Difficulty sitting down on and standing up from a chair with arms (e.g., wheelchair, bedside commode, etc,.)?: A Little Help needed moving to and from a bed to chair (including a wheelchair)?: A Little Help needed walking in hospital room?: A Little Help needed climbing 3-5 steps with a railing? : A Little 6 Click Score: 19    End of Session Equipment Utilized During Treatment: Gait belt;Cervical collar Activity Tolerance: Patient tolerated treatment  well Patient left: in bed;with call bell/phone within reach;with family/visitor present Nurse Communication: Mobility status PT Visit Diagnosis: Unsteadiness on feet (R26.81);Pain    Time: 0922-0943 PT Time Calculation (min) (ACUTE ONLY): 21 min   Charges:   PT Evaluation $PT Eval Moderate Complexity: 1 Procedure     PT G Codes:   PT G-Codes **NOT FOR INPATIENT CLASS** Functional Assessment Tool Used: Clinical judgement Functional Limitation: Mobility: Walking and moving around Mobility: Walking and Moving Around Current Status ([id number]): At least 1 percent but less than 20 percent impaired, limited or restricted Mobility: Walking and Moving Around Goal Status [phone]): At least 1 percent but less than 20 percent impaired, limited or restricted    Rolinda Roan, PT, DPT Acute Rehabilitation Services Pager: [place] 06/27/2016, 11:17 AM

## 2016-06-27 NOTE — Progress Notes (Signed)
Radicular pain improved Some dysphagia as expected Moving arms well Collar fits well Will stay one more day Increase steroids

## 2016-06-28 DIAGNOSIS — M4722 Other spondylosis with radiculopathy, cervical region: Secondary | ICD-10-CM | POA: Diagnosis not present

## 2016-06-28 MED ORDER — METHOCARBAMOL 750 MG PO TABS: 750.0000 mg | ORAL_TABLET | Freq: Four times a day (QID) | ORAL | 2 refills | Status: DC | PRN

## 2016-06-28 MED ORDER — DEXAMETHASONE 4 MG PO TABS
4.0000 mg | ORAL_TABLET | Freq: Four times a day (QID) | ORAL | Status: DC
  Administered 2016-06-28: 4 mg via ORAL
  Filled 2016-06-28: qty 1

## 2016-06-28 MED ORDER — HYDROCODONE-ACETAMINOPHEN 5-325 MG PO TABS: 1.0000 | ORAL_TABLET | ORAL | 0 refills | Status: DC | PRN

## 2016-06-28 MED ORDER — METHYLPREDNISOLONE 4 MG PO TABS: 4.0000 mg | ORAL_TABLET | Freq: Every day | ORAL | 0 refills | Status: DC

## 2016-06-28 NOTE — Progress Notes (Signed)
Occupational Therapy Treatment and Discharge Patient Details Name: EVENY ANASTAS MRN: [id number] DOB: February 14, 1940 Today's Date: 06/28/2016    History of present illness Pt is a 76 y/o female who presents s/p C2-C6 ACDF on 06/26/16. No pertinent PMH noted.   OT comments  This 76 yo female seen today to go over basic ADLs with her and family, no further OT needs, we will sign off.   Follow Up Recommendations  No OT follow up;Supervision/Assistance - 24 hour    Equipment Recommendations  3 in 1 bedside commode       Precautions / Restrictions Precautions Precautions: Fall;Cervical Required Braces or Orthoses: Cervical Brace Cervical Brace: Soft collar (off for showering, eating, and brushing teeth) Restrictions Weight Bearing Restrictions: No       Mobility Bed Mobility Overal bed mobility: Needs Assistance Bed Mobility: Supine to Sit     Supine to sit: Supervision (HOB all the way up and use of rail)     General bed mobility comments: Pt intends on sleeping in recliner at D/C   Transfers Overall transfer level: Needs assistance Equipment used: Rolling walker (2 wheeled) Transfers: Sit to/from Stand Sit to Stand: Min guard                  ADL either performed or assessed with clinical judgement   ADL Overall ADL's : Needs assistance/impaired                                       General ADL Comments: Pt/family educated on how to protect collar if she leaves it on for eating and brushing teeth, educated on use of wet wipes for back peri care, use of 2 cups for brushing teeth to avoid bending over the sink, bring legs up to her to dress LB (not bending over). Sequence of dressing and what UB clothes would be better     Vision Patient Visual Report: No change from baseline            Cognition Arousal/Alertness: Awake/alert Behavior During Therapy: WFL for tasks assessed/performed Overall Cognitive Status: Within Functional Limits for  tasks assessed                                                     Pertinent Vitals/ Pain       Pain Assessment: 0-10 Pain Score: 3  Pain Location: neck Pain Descriptors / Indicators: Sore Pain Intervention(s): Limited activity within patient's tolerance;Repositioned         Frequency  Min 2X/week        Progress Toward Goals  OT Goals(current goals can now be found in the care plan section)  Progress towards OT goals:  (All education completed)     Plan Discharge plan remains appropriate       AM-PAC PT "6 Clicks" Daily Activity     Outcome Measure   Help from another person eating meals?: None Help from another person taking care of personal grooming?: A Little Help from another person toileting, which includes using toliet, bedpan, or urinal?: A Little Help from another person bathing (including washing, rinsing, drying)?: A Little Help from another person to put on and taking off regular upper body clothing?: A Little Help from another person  to put on and taking off regular lower body clothing?: A Little 6 Click Score: 19    End of Session Equipment Utilized During Treatment: Cervical collar  OT Visit Diagnosis: Unsteadiness on feet (R26.81);Pain Pain - Right/Left: Left Pain - part of body: Shoulder   Activity Tolerance Patient tolerated treatment well   Patient Left in chair;with call bell/phone within reach;with family/visitor present   Nurse Communication  (to please get pt some extra sleeves for soft collar)        Time: 3716-9678 OT Time Calculation (min): 23 min  Charges: OT General Charges $OT Visit: 1 Procedure OT Treatments $Self Care/Home Management : 23-37 mins  [address], OTR/L [phone] 06/28/2016

## 2016-06-28 NOTE — Discharge Summary (Signed)
Date of Admission: 06/26/2016  Date of Discharge: 06/28/16  Admission Diagnosis: Cervical spondylosis with radiculopathy  Discharge Diagnosis: Same  Procedure Performed: C2-6 ACDF  Attending: Mahlani Berninger, Kevan Ny, MD  Hospital Course:  The patient was admitted for the above listed operation and had an uncomplicated post-operative course.  She was discharged in stable condition.  Follow up: 3 weeks  Allergies as of 06/28/2016      Reactions   Bisacodyl Other (See Comments)   Extreme cramping.   Ciprofloxacin Other (See Comments)   Muscle pain and stiffness   Penicillins Rash   Has patient had a PCN reaction causing immediate rash, facial/tongue/throat swelling, SOB or lightheadedness with hypotension: No Has patient had a PCN reaction causing severe rash involving mucus membranes or skin necrosis: No Has patient had a PCN reaction that required hospitalization: No Has patient had a PCN reaction occurring within the last 10 years: No If all of the above answers are "NO", then may proceed with Cephalosporin use.   Sulfa Antibiotics Rash      Medication List    TAKE these medications   acetaminophen 650 MG CR tablet Commonly known as:  TYLENOL Take 650 mg by mouth every 8 (eight) hours as needed for pain.   albuterol 108 (90 Base) MCG/ACT inhaler Commonly known as:  PROVENTIL HFA;VENTOLIN HFA Inhale 2 puffs into the lungs every 4 (four) hours as needed for wheezing or shortness of breath.   amLODipine 5 MG tablet Commonly known as:  NORVASC TAKE 1 TABLET BY MOUTH DAILY. IF SYSTOLIC BP GREATER THAN 951 IN AFTERNOON TAKE EXTRA 1/2 TABLET.   cetirizine 10 MG tablet Commonly known as:  ZYRTEC Take 10 mg by mouth daily as needed for allergies.   fluticasone 50 MCG/ACT nasal spray Commonly known as:  FLONASE Place 1 spray into the nose daily as needed for allergies.   gabapentin 300 MG capsule Commonly known as:  NEURONTIN Take 300 mg by mouth 3 (three) times daily.    HYDROcodone-acetaminophen 5-325 MG tablet Commonly known as:  NORCO/VICODIN Take 1-2 tablets by mouth every 4 (four) hours as needed. What changed:  how much to take  when to take this  reasons to take this   methocarbamol 750 MG tablet Commonly known as:  ROBAXIN Take 1 tablet (750 mg total) by mouth every 6 (six) hours as needed for muscle spasms.   methylPREDNISolone 4 MG tablet Commonly known as:  MEDROL Take 1 tablet (4 mg total) by mouth daily.   tiZANidine 2 MG tablet Commonly known as:  ZANAFLEX Take 2 mg by mouth every 8 (eight) hours as needed for muscle spasms.   VITAMIN D-1000 MAX ST 1000 units tablet Generic drug:  Cholecalciferol Take 1,000 Units by mouth daily.            Durable Medical Equipment        Start     Ordered   06/27/16 1231  For home use only DME Walker rolling  Once    Question:  Patient needs a walker to treat with the following condition  Answer:  Radiculopathy   06/27/16 1231   06/27/16 1229  For home use only DME 3 n 1  Once     06/27/16 1231

## 2016-06-28 NOTE — Progress Notes (Signed)
Mild to moderate dysphagia Feels well otherwise Voice normal Moving arms and legs well Incision looks good D/c

## 2016-06-28 NOTE — Progress Notes (Signed)
Physical Therapy Treatment Patient Details Name: Christine Ballard MRN: [id number] DOB: 1940/08/23 Today's Date: 06/28/2016    History of Present Illness Pt is a 76 y/o female who presents s/p C2-C6 ACDF on 06/26/16. No pertinent PMH noted.    PT Comments    Pt progressing towards physical therapy goals. Was able to perform transfers and ambulation with min guard assist grossly for balance support and safety. Pt and family were educated on car transfer, stair negotiation, precautions, walking program. Will continue to follow and progress as able per POC.    Follow Up Recommendations  No PT follow up;Supervision for mobility/OOB     Equipment Recommendations  Rolling walker with 5" wheels    Recommendations for Other Services       Precautions / Restrictions Precautions Precautions: Fall;Cervical Precaution Comments: Handout reviewed and pt cued for precautions during functional mobility.  Required Braces or Orthoses: Cervical Brace Cervical Brace: Soft collar (off for showering, eating, and brushing teeth) Restrictions Weight Bearing Restrictions: No    Mobility  Bed Mobility Overal bed mobility: Needs Assistance Bed Mobility: Rolling;Sidelying to Sit;Sit to Sidelying Rolling: Supervision Sidelying to sit: Supervision Supine to sit: Supervision (HOB all the way up and use of rail)   Sit to sidelying: Supervision General bed mobility comments: VC's for proper log roll technique. Was able to perform without assist.   Transfers Overall transfer level: Needs assistance Equipment used: Rolling walker (2 wheeled) Transfers: Sit to/from Stand Sit to Stand: Min guard         General transfer comment: Min guard assist to maintain stability during power up.   Ambulation/Gait Ambulation/Gait assistance: Min guard Ambulation Distance (Feet): 350 Feet Assistive device: Rolling walker (2 wheeled) Gait Pattern/deviations: Step-through pattern;Decreased stride length;Trunk  flexed Gait velocity: Decreased Gait velocity interpretation: Below normal speed for age/gender General Gait Details: Pt required cues for hands on walker at all times and general safety awareness. Close guard provided for safety, and occasional assist for walker management when pt taking her hand off one side.    Stairs Stairs: Yes   Stair Management: One rail Left;Step to pattern;Forwards Number of Stairs: 4 (x3 trials) General stair comments: Attempted with therapist first, then husband, then daughter to ensure she felt comfortable and safe negotiating stairs. VC's for pt and family education for safe guard and sequencing.   Wheelchair Mobility    Modified Rankin (Stroke Patients Only)       Balance Overall balance assessment: Needs assistance Sitting-balance support: No upper extremity supported;Feet supported Sitting balance-Leahy Scale: Fair     Standing balance support: No upper extremity supported;During functional activity Standing balance-Leahy Scale: Fair Standing balance comment: Able to statically stand without UE support at sink for ADL tasks.                            Cognition Arousal/Alertness: Awake/alert Behavior During Therapy: WFL for tasks assessed/performed Overall Cognitive Status: Within Functional Limits for tasks assessed                                        Exercises      General Comments        Pertinent Vitals/Pain Pain Assessment: Faces Pain Score: 3  Faces Pain Scale: Hurts little more Pain Location: neck Pain Descriptors / Indicators: Sore Pain Intervention(s): Monitored during session    Home  Living                      Prior Function            PT Goals (current goals can now be found in the care plan section) Acute Rehab PT Goals Patient Stated Goal: Home tomorrow PT Goal Formulation: With patient/family Time For Goal Achievement: 07/04/16 Potential to Achieve Goals:  Good Progress towards PT goals: Progressing toward goals    Frequency    Min 5X/week      PT Plan Current plan remains appropriate    Co-evaluation              AM-PAC PT "6 Clicks" Daily Activity  Outcome Measure  Difficulty turning over in bed (including adjusting bedclothes, sheets and blankets)?: None Difficulty moving from lying on back to sitting on the side of the bed? : A Little Difficulty sitting down on and standing up from a chair with arms (e.g., wheelchair, bedside commode, etc,.)?: A Little Help needed moving to and from a bed to chair (including a wheelchair)?: A Little Help needed walking in hospital room?: A Little Help needed climbing 3-5 steps with a railing? : A Little 6 Click Score: 19    End of Session Equipment Utilized During Treatment: Gait belt;Cervical collar Activity Tolerance: Patient tolerated treatment well Patient left: in bed;with call bell/phone within reach;with family/visitor present Nurse Communication: Mobility status PT Visit Diagnosis: Unsteadiness on feet (R26.81);Pain     Time: 9150-5697 PT Time Calculation (min) (ACUTE ONLY): 22 min  Charges:  $Gait Training: 8-22 mins                    G Codes:  Functional Assessment Tool Used: Clinical judgement Functional Limitation: Mobility: Walking and moving around Mobility: Walking and Moving Around Current Status [phone]): At least 1 percent but less than 20 percent impaired, limited or restricted Mobility: Walking and Moving Around Goal Status [phone]): At least 1 percent but less than 20 percent impaired, limited or restricted    Rolinda Roan, PT, DPT Acute Rehabilitation Services Pager: [place] 06/28/2016, 11:34 AM

## 2016-06-28 NOTE — Progress Notes (Addendum)
Patient alert and oriented, mae's well, voiding adequate amount of urine, swallowing without difficulty, no c/o pain at time of discharge. Patient discharged home with family. Script and discharged instructions given to patient. Patient and family stated understanding of instructions given. Patient has an appointment with Dr. Ditty  

## 2016-07-27 NOTE — Op Note (Signed)
06/26/2016  8:35 AM  PATIENT:  Christine Ballard  76 y.o. female  PRE-OPERATIVE DIAGNOSIS:  Cervical spondylosis with radiculopathy  POST-OPERATIVE DIAGNOSIS:  Same  PROCEDURE:  C2-6 anterior discectomy with fusion and plate fixation  SURGEON:  Aldean Ast, MD  ASSISTANTS: Kristeen Miss, MD  ANESTHESIA:   General  DRAINS: JP   SPECIMEN:  None  INDICATION FOR PROCEDURE: 76 year old female with medically intractable cervical radiculopathy. Patient understood the risks, benefits, and alternatives and potential outcomes and wished to proceed.  PROCEDURE DETAILS: Patient was brought to the operating room placed under general endotracheal anesthesia. Patient was placed in the supine position on the operating room table. The neck was prepped with betadine and chloraprep and draped in a sterile fashion.   A transverse incision was made on the right side of the neck. Dissection was carried down thru the subcutaneous tissue and the platysma was elevated, opened vertically, and undermined with Metzenbaum scissors. Dissection was then carried out thru an avascular plane leaving the sternocleidomastoid, carotid artery, and jugular vein laterally and the trachea and esophagus medially. The ventral aspect of the vertebral column was identified and a localizing x-ray was taken. The needle was placed at C3-4 but this was mistakenly interpreted as C4-5.  I continued dissection one level superior to this.  The longus colli muscles were then elevated and the retractor was placed. The annulus was incised and the disc space entered. Discectomy was performed with micro-curettes and pituitary and kerrison rongeurs. I then used the high-speed drill to drill the endplates down to the level of the posterior longitudinal ligament. The operating microscope was draped and brought into the field provided additional magnification, illumination and visualization. Utilizing microsurgical technique, discectomy was  continued posteriorly thru the disc space. Posterior longitudinal ligament was opened with a nerve hook, and then removed along with disc herniation and osteophytes, decompressing the spinal canal and thecal sac.  We then measured the height of the intravertebral disc space and selected a trabecular metal interbody cage packed with morselized allograft. It was then gently positioned in the intravertebral disc space and countersunk.   I then reviewed the Xray again, realized that the needle was at C3-4 and that I had inadvertently performed a C2-3 discectomy and fusion.  I proceeded to treat the intended levels.  The superior distraction pin was then removed and bone bleeding was stopped with bone wax. The distraction pin was inserted at the next inferior level. The annulus was incised and the disc space entered. Discectomy was performed with micro-curettes and pituitary and kerrison rongeurs. I then used the high-speed drill to drill the endplates down to the level of the posterior longitudinal ligament. The operating microscope was returned to the field.  The discectomy was continued posteriorly thru the disc space. Posterior longitudinal ligament was opened with a nerve hook, and then removed along with disc herniation and osteophytes, decompressing the spinal canal and thecal sac. We then continued to remove osteophytic overgrowth and disc material decompressing the neural foramina and exiting nerve roots bilaterally. So by both visualization and palpation we felt we had an adequate decompression of the neural elements. We then measured the height of the intravertebral disc space and selected a second interbody cage which was packed with allograft. It was then gently positioned in the intravertebral disc space and countersunk.   The superior pin was removed and re-inserted at the next inferior level. The annulus was incised and the disc space entered. Discectomy was performed  with micro-curettes and  pituitary and kerrison rongeurs. I then used the high-speed drill to drill the endplates down to the level of the posterior longitudinal ligament. The operating microscope was returned to the field.  Discectomy, decompression, and endplate preparation was carried out in the same manner as above.  We then measured the height of the intravertebral disc space and selected a third interbody cage which was packed with allograft. It was then gently positioned in the intravertebral disc space and countersunk.   The superior distraction pin was then removed and inserted at the most inferior level. The annulus was incised and the disc space entered. Discectomy was performed with micro-curettes and pituitary and kerrison rongeurs. I then used the high-speed drill to drill the endplates down to the level of the posterior longitudinal ligament. The operating microscope was returned to the field.  Discectomy, decompression, and endplate preparation was carried out in the same manner as above.  We then measured the height of the intravertebral disc space and selected a fourth interbody cage which was packed with allograft. It was then gently positioned in the intravertebral disc space and countersunk.    I then inserted a titanium plate and placed eight variable angle screws into the four superior vertebral bodies and two fixed angle screws at the inferior level and locked them into position. The wound was irrigated with bacitracin solution, checked for hemostasis which was established and confirmed. Once meticulous hemostasis was achieved a JP drain was placed. The platysma was closed with interrupted 3-0 undyed Vicryl suture, the subcuticular layer was closed with running subcuticular monocryl suture. The skin edges were approximated with dermabond. The drapes were removed. A sterile dressing was applied. The patient was then awakened from general anesthesia and transferred to the recovery room in stable condition. At the  end of the procedure all sponge, needle and instrument counts were correct.  PATIENT DISPOSITION:  PACU - hemodynamically stable.   Delay start of Pharmacological VTE agent (>24hrs) due to surgical blood loss or risk of bleeding:  yes

## 2016-09-09 DIAGNOSIS — J302 Other seasonal allergic rhinitis: Secondary | ICD-10-CM | POA: Insufficient documentation

## 2016-10-15 ENCOUNTER — Encounter: Payer: Self-pay | Admitting: *Deleted

## 2016-10-16 ENCOUNTER — Ambulatory Visit
Admission: RE | Admit: 2016-10-16 | Discharge: 2016-10-16 | Disposition: A | Discharge status: Home / Self Care | Payer: Medicare Other | Source: Ambulatory Visit | Attending: Gastroenterology | Admitting: Gastroenterology

## 2016-10-16 ENCOUNTER — Ambulatory Visit: Payer: Medicare Other | Admitting: Anesthesiology

## 2016-10-16 ENCOUNTER — Encounter
Admission: RE | Disposition: A | Discharge status: Home / Self Care | Payer: Self-pay | Source: Ambulatory Visit | Attending: Gastroenterology

## 2016-10-16 DIAGNOSIS — Z79899 Other long term (current) drug therapy: Secondary | ICD-10-CM | POA: Insufficient documentation

## 2016-10-16 DIAGNOSIS — K621 Rectal polyp: Secondary | ICD-10-CM | POA: Insufficient documentation

## 2016-10-16 DIAGNOSIS — Z881 Allergy status to other antibiotic agents status: Secondary | ICD-10-CM | POA: Diagnosis not present

## 2016-10-16 DIAGNOSIS — Z888 Allergy status to other drugs, medicaments and biological substances status: Secondary | ICD-10-CM | POA: Diagnosis not present

## 2016-10-16 DIAGNOSIS — I129 Hypertensive chronic kidney disease with stage 1 through stage 4 chronic kidney disease, or unspecified chronic kidney disease: Secondary | ICD-10-CM | POA: Diagnosis not present

## 2016-10-16 DIAGNOSIS — F329 Major depressive disorder, single episode, unspecified: Secondary | ICD-10-CM | POA: Diagnosis not present

## 2016-10-16 DIAGNOSIS — F419 Anxiety disorder, unspecified: Secondary | ICD-10-CM | POA: Diagnosis not present

## 2016-10-16 DIAGNOSIS — G709 Myoneural disorder, unspecified: Secondary | ICD-10-CM | POA: Diagnosis not present

## 2016-10-16 DIAGNOSIS — N189 Chronic kidney disease, unspecified: Secondary | ICD-10-CM | POA: Diagnosis not present

## 2016-10-16 DIAGNOSIS — K644 Residual hemorrhoidal skin tags: Secondary | ICD-10-CM | POA: Diagnosis not present

## 2016-10-16 DIAGNOSIS — M8580 Other specified disorders of bone density and structure, unspecified site: Secondary | ICD-10-CM | POA: Diagnosis not present

## 2016-10-16 DIAGNOSIS — Z1211 Encounter for screening for malignant neoplasm of colon: Secondary | ICD-10-CM | POA: Insufficient documentation

## 2016-10-16 DIAGNOSIS — J45909 Unspecified asthma, uncomplicated: Secondary | ICD-10-CM | POA: Insufficient documentation

## 2016-10-16 DIAGNOSIS — K640 First degree hemorrhoids: Secondary | ICD-10-CM | POA: Insufficient documentation

## 2016-10-16 DIAGNOSIS — Z880 Allergy status to penicillin: Secondary | ICD-10-CM | POA: Insufficient documentation

## 2016-10-16 DIAGNOSIS — Z882 Allergy status to sulfonamides status: Secondary | ICD-10-CM | POA: Insufficient documentation

## 2016-10-16 DIAGNOSIS — M1990 Unspecified osteoarthritis, unspecified site: Secondary | ICD-10-CM | POA: Diagnosis not present

## 2016-10-16 HISTORY — DX: Constipation, unspecified: K59.00

## 2016-10-16 HISTORY — DX: Allergic rhinitis, unspecified: J30.9

## 2016-10-16 HISTORY — DX: Anxiety disorder, unspecified: F41.9

## 2016-10-16 HISTORY — DX: Personal history of other diseases of urinary system: Z87.448

## 2016-10-16 HISTORY — DX: Other specified disorders of bone density and structure, unspecified site: M85.80

## 2016-10-16 HISTORY — DX: Depression, unspecified: F32.A

## 2016-10-16 HISTORY — DX: Chronic kidney disease, unspecified: N18.9

## 2016-10-16 HISTORY — PX: COLONOSCOPY WITH PROPOFOL: SHX5780

## 2016-10-16 HISTORY — DX: Major depressive disorder, single episode, unspecified: F32.9

## 2016-10-16 SURGERY — COLONOSCOPY WITH PROPOFOL
Anesthesia: General

## 2016-10-16 MED ORDER — ALBUTEROL SULFATE HFA 108 (90 BASE) MCG/ACT IN AERS
INHALATION_SPRAY | RESPIRATORY_TRACT | Status: AC
  Filled 2016-10-16: qty 6.7

## 2016-10-16 MED ORDER — EPHEDRINE SULFATE 50 MG/ML IJ SOLN
INTRAMUSCULAR | Status: DC | PRN
  Administered 2016-10-16: 10 mg via INTRAVENOUS

## 2016-10-16 MED ORDER — PROPOFOL 500 MG/50ML IV EMUL
INTRAVENOUS | Status: DC | PRN
  Administered 2016-10-16: 140 ug/kg/min via INTRAVENOUS

## 2016-10-16 MED ORDER — PROPOFOL 10 MG/ML IV BOLUS
INTRAVENOUS | Status: DC | PRN
  Administered 2016-10-16: 80 mg via INTRAVENOUS

## 2016-10-16 MED ORDER — FENTANYL CITRATE (PF) 100 MCG/2ML IJ SOLN
INTRAMUSCULAR | Status: AC
  Filled 2016-10-16: qty 2

## 2016-10-16 MED ORDER — SODIUM CHLORIDE 0.9 % IV SOLN
INTRAVENOUS | Status: DC
  Administered 2016-10-16: 11:00:00 via INTRAVENOUS

## 2016-10-16 MED ORDER — PROPOFOL 500 MG/50ML IV EMUL
INTRAVENOUS | Status: AC
  Filled 2016-10-16: qty 50

## 2016-10-16 MED ORDER — LIDOCAINE 2% (20 MG/ML) 5 ML SYRINGE
INTRAMUSCULAR | Status: DC | PRN
  Administered 2016-10-16: 30 mg via INTRAVENOUS

## 2016-10-16 MED ORDER — PHENYLEPHRINE HCL 10 MG/ML IJ SOLN
INTRAMUSCULAR | Status: DC | PRN
  Administered 2016-10-16: 100 ug via INTRAVENOUS

## 2016-10-16 MED ORDER — FENTANYL CITRATE (PF) 100 MCG/2ML IJ SOLN
INTRAMUSCULAR | Status: DC | PRN
  Administered 2016-10-16: 50 ug via INTRAVENOUS

## 2016-10-16 NOTE — Transfer of Care (Signed)
Immediate Anesthesia Transfer of Care Note  Patient: Christine Ballard  Procedure(s) Performed: COLONOSCOPY WITH PROPOFOL (N/A )  Patient Location: PACU and Endoscopy Unit  Anesthesia Type:General  Level of Consciousness: sedated  Airway & Oxygen Therapy: Patient Spontanous Breathing and Patient connected to nasal cannula oxygen  Post-op Assessment: Report given to RN and Post -op Vital signs reviewed and stable  Post vital signs: Reviewed and stable  Last Vitals:  Vitals:   10/16/16 1031  BP: (!) 169/85  Pulse: 66  Resp: 18  Temp: (!) 36.2 C  SpO2: 97%    Last Pain:  Vitals:   10/16/16 1031  PainSc: 3          Complications: No apparent anesthesia complications

## 2016-10-16 NOTE — Anesthesia Postprocedure Evaluation (Signed)
Anesthesia Post Note  Patient: Christine Ballard  Procedure(s) Performed: COLONOSCOPY WITH PROPOFOL (N/A )  Patient location during evaluation: Endoscopy Anesthesia Type: General Level of consciousness: awake and alert Pain management: pain level controlled Vital Signs Assessment: post-procedure vital signs reviewed and stable Respiratory status: spontaneous breathing and respiratory function stable Cardiovascular status: stable Anesthetic complications: no     Last Vitals:  Vitals:   10/16/16 1119 10/16/16 1121  BP: (!) 101/51 (!) 101/51  Pulse: 64 62  Resp: 14 14  Temp: (!) 36.2 C (!) 36.2 C  SpO2: 99% 99%    Last Pain:  Vitals:   10/16/16 1119  TempSrc: Tympanic  PainSc:                  KEPHART,WILLIAM K

## 2016-10-16 NOTE — Op Note (Signed)
[facility] Gastroenterology Patient Name: Christine Ballard Procedure Date: 10/16/2016 10:40 AM MRN: [id number] Account #: [id number] Date of Birth: 1940-10-23 Admit Type: Outpatient Age: 76 Room: [facility] ENDO ROOM 3 Gender: Female Note Status: Finalized Procedure:            Colonoscopy Indications:          Screening for colorectal malignant neoplasm Providers:            Lollie Sails, MD Referring MD:         Glendon Axe (Referring MD) Medicines:            Monitored Anesthesia Care Complications:        No immediate complications. Procedure:            Pre-Anesthesia Assessment:                       - ASA Grade Assessment: III - A patient with severe                        systemic disease.                       After obtaining informed consent, the colonoscope was                        passed under direct vision. Throughout the procedure,                        the patient's blood pressure, pulse, and oxygen                        saturations were monitored continuously. The                        Colonoscope was introduced through the anus and                        advanced to the the cecum, identified by appendiceal                        orifice and ileocecal valve. The colonoscopy was                        performed with moderate difficulty due to poor bowel                        prep. Successful completion of the procedure was aided                        by lavage. The quality of the bowel preparation was                        good except the ascending colon was fair. Findings:      Four sessile polyps were found in the rectum. The polyps were less than       1 mm in size. These polyps were removed with a cold biopsy forceps.       Resection and retrieval were complete.      Non-bleeding internal hemorrhoids were found during anoscopy. The       hemorrhoids were small and Grade I (internal  hemorrhoids that do not       prolapse).      The  perianal exam findings include a skin tag. DRE otherwise normal. Impression:           - Four less than 1 mm polyps in the rectum, removed                        with a cold biopsy forceps. Resected and retrieved.                       - Non-bleeding internal hemorrhoids.                       - Perianal skin tag found on perianal exam. Recommendation:       - Discharge patient to home.                       - Telephone GI clinic for pathology results in 1 week. Procedure Code(s):    --- Professional ---                       [phone], Colonoscopy, flexible; with biopsy, single or                        multiple Diagnosis Code(s):    --- Professional ---                       Z12.11, Encounter for screening for malignant neoplasm                        of colon                       K62.1, Rectal polyp                       K64.0, First degree hemorrhoids                       K64.4, Residual hemorrhoidal skin tags CPT copyright 2016 [employer]. All rights reserved. The codes documented in this report are preliminary and upon coder review may  be revised to meet current compliance requirements. Lollie Sails, MD 10/16/2016 11:17:00 AM This report has been signed electronically. Number of Addenda: 0 Note Initiated On: 10/16/2016 10:40 AM Scope Withdrawal Time: 0 hours 12 minutes 56 seconds  Total Procedure Duration: 0 hours 25 minutes 52 seconds       [facility]

## 2016-10-16 NOTE — Anesthesia Preprocedure Evaluation (Signed)
Anesthesia Evaluation  Patient identified by MRN, date of birth, ID band Patient awake    Reviewed: Allergy & Precautions, H&P , NPO status , Patient's Chart, lab work & pertinent test results  History of Anesthesia Complications Negative for: history of anesthetic complications  Airway Mallampati: III  TM Distance: <3 FB Neck ROM: limited    Dental  (+) Chipped   Pulmonary neg shortness of breath, asthma ,           Cardiovascular Exercise Tolerance: Good hypertension, (-) angina(-) Past MI and (-) DOE      Neuro/Psych  Headaches, PSYCHIATRIC DISORDERS Anxiety Depression  Neuromuscular disease    GI/Hepatic negative GI ROS, Neg liver ROS, neg GERD  ,  Endo/Other  negative endocrine ROS  Renal/GU CRFRenal disease  negative genitourinary   Musculoskeletal  (+) Arthritis ,   Abdominal   Peds  Hematology negative hematology ROS (+)   Anesthesia Other Findings Past Medical History: No date: Allergic rhinitis No date: Allergy-induced asthma No date: Anxiety No date: Arthritis     Comment:  chronic hip pain,right No date: Cataracts, bilateral     Comment:  "small" No date: Chronic kidney disease     Comment:  mild renal insufficiency No date: Constipation No date: Depression No date: Headache No date: History of bronchitis     Comment:  "about twice a year" No date: History of hematuria No date: Hypertension No date: Hypoglycemia No date: Osteopenia No date: Wears glasses  Past Surgical History: No date: ABDOMINAL HYSTERECTOMY 06/26/2016: ANTERIOR CERVICAL DECOMP/DISCECTOMY FUSION; N/A     Comment:  Procedure: Cervical Two-Three, Cervical Three-Four,               Cervical Four-Five, Cervical Five-Six Anterior Discectomy              with Fusion and Plate Fixation;  Surgeon: Ditty, Kevan Ny, MD;  Location: [place];  Service: Neurosurgery;                Laterality: N/A;  C3-6 Anterior  discectomy with fusion               and plate fixation No date: BASAL CELL CARCINOMA EXCISION     Comment:  on back No date: BLEPHAROPLASTY No date: CARPAL TUNNEL RELEASE; Bilateral No date: COLONOSCOPY No date: EYE SURGERY No date: NASAL SINUS SURGERY No date: TONSILLECTOMY     Reproductive/Obstetrics negative OB ROS                             Anesthesia Physical Anesthesia Plan  ASA: III  Anesthesia Plan: General   Post-op Pain Management:    Induction: Intravenous  PONV Risk Score and Plan: Propofol infusion  Airway Management Planned: Natural Airway and Nasal Cannula  Additional Equipment:   Intra-op Plan:   Post-operative Plan:   Informed Consent: I have reviewed the patients History and Physical, chart, labs and discussed the procedure including the risks, benefits and alternatives for the proposed anesthesia with the patient or authorized representative who has indicated his/her understanding and acceptance.   Dental Advisory Given  Plan Discussed with: Anesthesiologist, CRNA and Surgeon  Anesthesia Plan Comments: (Patient consented for risks of anesthesia including but not limited to:  - adverse reactions to medications - risk of intubation if required - damage to teeth, lips or other oral mucosa - sore throat  or hoarseness - Damage to heart, brain, lungs or loss of life  Patient voiced understanding.)        Anesthesia Quick Evaluation

## 2016-10-16 NOTE — H&P (Signed)
Outpatient short stay form Pre-procedure 10/16/2016 10:33 AM Christine Sails MD  Primary Physician: Dr. Glendon Axe  Reason for visit:  screeningcolonoscopy  History of present illness:  Patient is a 76 year old female presenting today as above.she tolerated her prep well. She takes no aspirin or blood thinning agents.    Current Facility-Administered Medications:  .  0.9 %  sodium chloride infusion, , Intravenous, Continuous, Christine Sails, MD, Last Rate: 20 mL/hr at 10/16/16 1031 .  0.9 %  sodium chloride infusion, , Intravenous, Continuous, Christine Sails, MD  Prescriptions Prior to Admission  Medication Sig Dispense Refill Last Dose  . amLODipine (NORVASC) 5 MG tablet TAKE 1 .5 TABLET BY MOUTH DAILY. IF SYSTOLIC BP GREATER THAN 009 IN AFTERNOON TAKE EXTRA 1/2 TABLET.   10/16/2016 at Unknown time  . cetirizine (ZYRTEC) 10 MG tablet Take 10 mg by mouth daily as needed for allergies.   Past Week at Unknown time  . Cholecalciferol (VITAMIN D-1000 MAX ST) 1000 units tablet Take 1,000 Units by mouth daily.    10/15/2016 at Unknown time  . docusate sodium (COLACE) 100 MG capsule Take 100 mg by mouth daily as needed for mild constipation.     . fluticasone (FLONASE) 50 MCG/ACT nasal spray Place 1 spray into the nose daily as needed for allergies.    Past Week at Unknown time  . HYDROcodone-acetaminophen (NORCO/VICODIN) 5-325 MG tablet Take 1-2 tablets by mouth every 4 (four) hours as needed. 60 tablet 0 10/15/2016 at Unknown time  . acetaminophen (TYLENOL) 650 MG CR tablet Take 650 mg by mouth every 8 (eight) hours as needed for pain.   06/25/2016 at Unknown time  . albuterol (PROVENTIL HFA;VENTOLIN HFA) 108 (90 Base) MCG/ACT inhaler Inhale 2 puffs into the lungs every 4 (four) hours as needed for wheezing or shortness of breath.    06/16/2016  . methocarbamol (ROBAXIN) 750 MG tablet Take 1 tablet (750 mg total) by mouth every 6 (six) hours as needed for muscle spasms. 120 tablet 2  10/13/2016  . tiZANidine (ZANAFLEX) 2 MG tablet Take 2 mg by mouth every 8 (eight) hours as needed for muscle spasms.   Not Taking at Unknown time     Allergies  Allergen Reactions  . Bisacodyl Other (See Comments)    Extreme cramping.  . Ciprofloxacin Other (See Comments)    Muscle pain and stiffness  . Penicillins Rash    Has patient had a PCN reaction causing immediate rash, facial/tongue/throat swelling, SOB or lightheadedness with hypotension: No Has patient had a PCN reaction causing severe rash involving mucus membranes or skin necrosis: No Has patient had a PCN reaction that required hospitalization: No Has patient had a PCN reaction occurring within the last 10 years: No If all of the above answers are "NO", then may proceed with Cephalosporin use.   . Sulfa Antibiotics Rash     Past Medical History:  Diagnosis Date  . Allergic rhinitis   . Allergy-induced asthma   . Anxiety   . Arthritis    chronic hip pain,right  . Cataracts, bilateral    "small"  . Chronic kidney disease    mild renal insufficiency  . Constipation   . Depression   . Headache   . History of bronchitis    "about twice a year"  . History of hematuria   . Hypertension   . Hypoglycemia   . Osteopenia   . Wears glasses     Review of systems:  Physical Exam    Heart and lungs: regular rate and rhythm without rub or gallop, lungs are bilaterally clear.    HEENT: normocephalic atraumatic eyes are anicteric    Other:     Pertinant exam for procedure: soft nontender nondistended bowel sounds positive normoactive    Planned proceedures: colonoscopy a I have discussed the risks benefits and complications of procedures to include not limited to bleeding, infection, perforation and the risk of sedation and the patient wishes to proceed.    Christine Sails, MD Gastroenterology 10/16/2016  10:33 AM

## 2016-10-16 NOTE — Anesthesia Post-op Follow-up Note (Signed)
Anesthesia QCDR form completed.        

## 2016-10-17 ENCOUNTER — Encounter: Payer: Self-pay | Admitting: Gastroenterology

## 2016-10-17 LAB — SURGICAL PATHOLOGY

## 2016-12-19 DIAGNOSIS — M5030 Other cervical disc degeneration, unspecified cervical region: Secondary | ICD-10-CM | POA: Insufficient documentation

## 2017-04-03 ENCOUNTER — Other Ambulatory Visit: Payer: Self-pay | Admitting: Neurological Surgery

## 2017-04-03 DIAGNOSIS — M542 Cervicalgia: Secondary | ICD-10-CM

## 2017-04-10 ENCOUNTER — Ambulatory Visit
Admission: RE | Admit: 2017-04-10 | Discharge: 2017-04-10 | Disposition: A | Discharge status: Home / Self Care | Payer: Medicare Other | Source: Ambulatory Visit | Attending: Neurological Surgery | Admitting: Neurological Surgery

## 2017-04-10 DIAGNOSIS — M542 Cervicalgia: Secondary | ICD-10-CM

## 2017-06-18 ENCOUNTER — Other Ambulatory Visit: Payer: Self-pay | Admitting: Internal Medicine

## 2017-06-18 DIAGNOSIS — Z1231 Encounter for screening mammogram for malignant neoplasm of breast: Secondary | ICD-10-CM

## 2017-06-18 DIAGNOSIS — G8929 Other chronic pain: Secondary | ICD-10-CM | POA: Insufficient documentation

## 2017-07-18 ENCOUNTER — Ambulatory Visit
Admission: RE | Admit: 2017-07-18 | Discharge: 2017-07-18 | Disposition: A | Discharge status: Home / Self Care | Payer: Medicare Other | Source: Ambulatory Visit | Attending: Internal Medicine | Admitting: Internal Medicine

## 2017-07-18 DIAGNOSIS — Z1231 Encounter for screening mammogram for malignant neoplasm of breast: Secondary | ICD-10-CM | POA: Insufficient documentation

## 2017-09-20 ENCOUNTER — Emergency Department: Payer: Medicare Other

## 2017-09-20 ENCOUNTER — Encounter: Payer: Self-pay | Admitting: Emergency Medicine

## 2017-09-20 ENCOUNTER — Emergency Department
Admission: EM | Admit: 2017-09-20 | Discharge: 2017-09-20 | Disposition: A | Discharge status: Home / Self Care | Payer: Medicare Other | Attending: Student in an Organized Health Care Education/Training Program | Admitting: Student in an Organized Health Care Education/Training Program

## 2017-09-20 DIAGNOSIS — Z79899 Other long term (current) drug therapy: Secondary | ICD-10-CM | POA: Insufficient documentation

## 2017-09-20 DIAGNOSIS — Y999 Unspecified external cause status: Secondary | ICD-10-CM | POA: Insufficient documentation

## 2017-09-20 DIAGNOSIS — Y939 Activity, unspecified: Secondary | ICD-10-CM | POA: Insufficient documentation

## 2017-09-20 DIAGNOSIS — J45909 Unspecified asthma, uncomplicated: Secondary | ICD-10-CM | POA: Insufficient documentation

## 2017-09-20 DIAGNOSIS — Y929 Unspecified place or not applicable: Secondary | ICD-10-CM | POA: Diagnosis not present

## 2017-09-20 DIAGNOSIS — N189 Chronic kidney disease, unspecified: Secondary | ICD-10-CM | POA: Insufficient documentation

## 2017-09-20 DIAGNOSIS — M25561 Pain in right knee: Secondary | ICD-10-CM | POA: Insufficient documentation

## 2017-09-20 DIAGNOSIS — X500XXA Overexertion from strenuous movement or load, initial encounter: Secondary | ICD-10-CM | POA: Diagnosis not present

## 2017-09-20 DIAGNOSIS — I129 Hypertensive chronic kidney disease with stage 1 through stage 4 chronic kidney disease, or unspecified chronic kidney disease: Secondary | ICD-10-CM | POA: Insufficient documentation

## 2017-09-20 MED ORDER — OXYCODONE HCL 5 MG PO TABS
5.0000 mg | ORAL_TABLET | Freq: Once | ORAL | Status: AC
  Administered 2017-09-20: 5 mg via ORAL
  Filled 2017-09-20: qty 1

## 2017-09-20 MED ORDER — OXYCODONE-ACETAMINOPHEN 5-325 MG PO TABS: 1.0000 | ORAL_TABLET | Freq: Three times a day (TID) | ORAL | 0 refills | Status: AC | PRN

## 2017-09-20 NOTE — ED Notes (Signed)
See triage note  Presents with pain to right knee   States she felt a pop to knee when she was trying to get up from chair  No deformity noted

## 2017-09-20 NOTE — ED Notes (Signed)
Taken to U/S via stretcher  Family at bedside

## 2017-09-20 NOTE — ED Triage Notes (Signed)
Pt arrives with complaints of right knee pain that started "a few weeks ago."Pt denies any injury and states she went to her PCP Wednesday who attributed her pain to arthritis. Pt states today she "heard a pop," and since has been unable to bear weight.

## 2017-09-20 NOTE — ED Provider Notes (Signed)
[facility] Emergency Department Provider Note  ____________________________________________  Time seen: Approximately 6:02 PM  I have reviewed the triage vital signs and the nursing notes.   HISTORY  Chief Complaint Knee Pain    HPI Christine Ballard is a 77 y.o. female presents emergency department for evaluation of right knee pain for several months, worsening for 2 days.  She was evaluated by primary care 2 days ago and was given a prescription for prednisone for possible arthritis.  She has had significant pain with bearing weight today.  Pain is worse in the back of her knee.  No specific injury.  Past Medical History:  Diagnosis Date  . Allergic rhinitis   . Allergy-induced asthma   . Anxiety   . Arthritis    chronic hip pain,right  . Cataracts, bilateral    "small"  . Chronic kidney disease    mild renal insufficiency  . Constipation   . Depression   . Headache   . History of bronchitis    "about twice a year"  . History of hematuria   . Hypertension   . Hypoglycemia   . Osteopenia   . Wears glasses     Patient Active Problem List   Diagnosis Date Noted  . Cervical spondylosis with radiculopathy 06/26/2016    Past Surgical History:  Procedure Laterality Date  . ABDOMINAL HYSTERECTOMY    . ANTERIOR CERVICAL DECOMP/DISCECTOMY FUSION N/A 06/26/2016   Procedure: Cervical Two-Three, Cervical Three-Four, Cervical Four-Five, Cervical Five-Six Anterior Discectomy with Fusion and Plate Fixation;  Surgeon: Ditty, Kevan Ny, MD;  Location: [place];  Service: Neurosurgery;  Laterality: N/A;  C3-6 Anterior discectomy with fusion and plate fixation  . BASAL CELL CARCINOMA EXCISION     on back  . BLEPHAROPLASTY    . CARPAL TUNNEL RELEASE Bilateral   . COLONOSCOPY    . COLONOSCOPY WITH PROPOFOL N/A 10/16/2016   Procedure: COLONOSCOPY WITH PROPOFOL;  Surgeon: Lollie Sails, MD;  Location: [facility] ENDOSCOPY;  Service: Endoscopy;  Laterality:  N/A;  . EYE SURGERY    . NASAL SINUS SURGERY    . TONSILLECTOMY      Prior to Admission medications   Medication Sig Start Date End Date Taking? Authorizing Provider  acetaminophen (TYLENOL) 650 MG CR tablet Take 650 mg by mouth every 8 (eight) hours as needed for pain.    [provider]  albuterol (PROVENTIL HFA;VENTOLIN HFA) 108 (90 Base) MCG/ACT inhaler Inhale 2 puffs into the lungs every 4 (four) hours as needed for wheezing or shortness of breath.  11/28/15 11/27/16  [provider]  amLODipine (NORVASC) 5 MG tablet TAKE 1 .5 TABLET BY MOUTH DAILY. IF SYSTOLIC BP GREATER THAN 606 IN AFTERNOON TAKE EXTRA 1/2 TABLET. 08/19/15   [provider]  cetirizine (ZYRTEC) 10 MG tablet Take 10 mg by mouth daily as needed for allergies.    [provider]  Cholecalciferol (VITAMIN D-1000 MAX ST) 1000 units tablet Take 1,000 Units by mouth daily.     [provider]  docusate sodium (COLACE) 100 MG capsule Take 100 mg by mouth daily as needed for mild constipation.    [provider]  fluticasone (FLONASE) 50 MCG/ACT nasal spray Place 1 spray into the nose daily as needed for allergies.  08/19/15   [provider]  HYDROcodone-acetaminophen (NORCO/VICODIN) 5-325 MG tablet Take 1-2 tablets by mouth every 4 (four) hours as needed. 06/28/16   Ditty, Kevan Ny, MD  methocarbamol (ROBAXIN) 750 MG tablet  Take 1 tablet (750 mg total) by mouth every 6 (six) hours as needed for muscle spasms. 06/28/16   Ditty, Kevan Ny, MD  oxyCODONE-acetaminophen (PERCOCET) 5-325 MG tablet Take 1 tablet by mouth every 8 (eight) hours as needed for up to 3 days for severe pain. 09/20/17 09/23/17  Laban Emperor, PA-C  tiZANidine (ZANAFLEX) 2 MG tablet Take 2 mg by mouth every 8 (eight) hours as needed for muscle spasms.    [provider]    Allergies Bisacodyl; Ciprofloxacin; Penicillins; and Sulfa antibiotics  Family History  Problem Relation Age  of Onset  . Breast cancer Maternal Aunt   . Breast cancer Cousin     Social History Social History   Tobacco Use  . Smoking status: Never Smoker  . Smokeless tobacco: Never Used  Substance Use Topics  . Alcohol use: No  . Drug use: No     Review of Systems  Cardiovascular: No chest pain. Respiratory: No SOB. Gastrointestinal: No abdominal pain.  No nausea, no vomiting.  Musculoskeletal: Positive for knee pain. Skin: Negative for rash, abrasions, lacerations, ecchymosis.   ____________________________________________   PHYSICAL EXAM:  VITAL SIGNS: ED Triage Vitals  Enc Vitals Group     BP 09/20/17 1415 (!) 119/91     Pulse Rate 09/20/17 1415 74     Resp 09/20/17 1415 18     Temp 09/20/17 1415 98 F (36.7 C)     Temp Source 09/20/17 1415 Oral     SpO2 09/20/17 1415 99 %     Weight 09/20/17 1416 147 lb (66.7 kg)     Height 09/20/17 1416 5' (1.524 m)     Head Circumference --      Peak Flow --      Pain Score 09/20/17 1416 10     Pain Loc --      Pain Edu? --      Excl. in [place]? --      Constitutional: Alert and oriented. Well appearing and in no acute distress. Eyes: Conjunctivae are normal. PERRL. EOMI. Head: Atraumatic. ENT:      Ears:      Nose: No congestion/rhinnorhea.      Mouth/Throat: Mucous membranes are moist.  Neck: No stridor.   Cardiovascular: Normal rate, regular rhythm.  Good peripheral circulation. Respiratory: Normal respiratory effort without tachypnea or retractions. Lungs CTAB. Good air entry to the bases with no decreased or absent breath sounds. Musculoskeletal: Full range of motion to all extremities. No gross deformities appreciated.  Mild tenderness to palpation over posterior knee. No effusion noted. Negative anterior drawer, posterior drawer, valgus, varus, mcMurray, patella apprehension, apley grind. Neurologic:  Normal speech and language. No gross focal neurologic deficits are appreciated.  Skin:  Skin is warm, dry and intact. No  rash noted. Psychiatric: Mood and affect are normal. Speech and behavior are normal. Patient exhibits appropriate insight and judgement.   ____________________________________________   LABS (all labs ordered are listed, but only abnormal results are displayed)  Labs Reviewed - No data to display ____________________________________________  EKG   ____________________________________________  RADIOLOGY Robinette Haines, personally viewed and evaluated these images (plain radiographs) as part of my medical decision making, as well as reviewing the written report by the radiologist.  US Venous Img Lower Unilateral Right  Result Date: 09/20/2017 CLINICAL DATA:  77 year old with pain in the right popliteal area. EXAM: RIGHT LOWER EXTREMITY VENOUS DOPPLER ULTRASOUND TECHNIQUE: Gray-scale sonography with graded compression, as well as color Doppler and duplex ultrasound were  performed to evaluate the lower extremity deep venous systems from the level of the common femoral vein and including the common femoral, femoral, profunda femoral, popliteal and calf veins including the posterior tibial, peroneal and gastrocnemius veins when visible. The superficial great saphenous vein was also interrogated. Spectral Doppler was utilized to evaluate flow at rest and with distal augmentation maneuvers in the common femoral, femoral and popliteal veins. COMPARISON:  None. FINDINGS: Contralateral Common Femoral Vein: Respiratory phasicity is normal and symmetric with the symptomatic side. No evidence of thrombus. Normal compressibility. Common Femoral Vein: No evidence of thrombus. Normal compressibility, respiratory phasicity and response to augmentation. Saphenofemoral Junction: No evidence of thrombus. Normal compressibility and flow on color Doppler imaging. Profunda Femoral Vein: No evidence of thrombus. Normal compressibility and flow on color Doppler imaging. Femoral Vein: No evidence of thrombus. Normal  compressibility, respiratory phasicity and response to augmentation. Popliteal Vein: No evidence of thrombus. Normal compressibility, respiratory phasicity and response to augmentation. Calf Veins: No evidence of thrombus. Normal compressibility and flow on color Doppler imaging. Other Findings:  None. IMPRESSION: Negative for deep venous thrombosis in right lower extremity. Electronically Signed   By: Markus Daft M.D.   On: 09/20/2017 16:32   Dg Knee Complete 4 Views Right  Result Date: 09/20/2017 CLINICAL DATA:  Knee injury, knee pain EXAM: RIGHT KNEE - COMPLETE 4+ VIEW COMPARISON:  None. FINDINGS: No fracture or malalignment. No significant knee effusion. Joint spaces are within normal limits IMPRESSION: No acute osseous abnormality. Electronically Signed   By: Donavan Foil M.D.   On: 09/20/2017 15:03    ____________________________________________    PROCEDURES  Procedure(s) performed:    Procedures    Medications  oxyCODONE (Oxy IR/ROXICODONE) immediate release tablet 5 mg (5 mg Oral Given 09/20/17 1534)     ____________________________________________   INITIAL IMPRESSION / ASSESSMENT AND PLAN / ED COURSE  Pertinent labs & imaging results that were available during my care of the patient were reviewed by me and considered in my medical decision making (see chart for details).  Review of the [place] CSRS was performed in accordance of the [place] prior to dispensing any controlled drugs.     Patient presents emergency department for evaluation of knee pain.  Care was taken over from Sherrie George, NP.  Knee x-ray negative for acute abnormality.  Ultrasound negative for DVT.  Pain improved significantly with Percocet.  She lives with her husband.  Her house is wheelchair accessible.  She has a wheelchair and walker at home that she will use for ambulation over the weekend.  Patient is agreeable to follow-up with orthopedics next week.  She has seen Rachelle Hora in the past.  Patient  will be discharged home with prescriptions for Percocet. Patient is to follow up with orthopedics as directed. Patient is given ED precautions to return to the ED for any worsening or new symptoms.     ____________________________________________  FINAL CLINICAL IMPRESSION(S) / ED DIAGNOSES  Final diagnoses:  Acute pain of right knee      NEW MEDICATIONS STARTED DURING THIS VISIT:  ED Discharge Orders         Ordered    oxyCODONE-acetaminophen (PERCOCET) 5-325 MG tablet  Every 8 hours PRN     09/20/17 1703              This chart was dictated using voice recognition software/Dragon. Despite best efforts to proofread, errors can occur which can change the meaning. Any change was purely unintentional.  Laban Emperor, PA-C 09/20/17 1805    Nance Pear, MD 09/24/17 [phone]

## 2017-09-20 NOTE — ED Notes (Signed)
Jones wrap applied to right knee

## 2017-09-20 NOTE — ED Provider Notes (Signed)
[facility] Emergency Department Provider Note ____________________________________________  Time seen: Approximately 2:39 PM  I have reviewed the triage vital signs and the nursing notes.   HISTORY  Chief Complaint Knee Pain    HPI Christine Ballard is a 77 y.o. female who presents to the emergency department for evaluation and treatment of right knee pain that started a few weeks ago. No specific injury. She was evaluated by her PCP 2 days ago who diagnosed the pain as arthritis. Today, she heard a pop and has been unable to bear weight since that time.No alleviating measures attempted prior to arrival.   Past Medical History:  Diagnosis Date  . Allergic rhinitis   . Allergy-induced asthma   . Anxiety   . Arthritis    chronic hip pain,right  . Cataracts, bilateral    "small"  . Chronic kidney disease    mild renal insufficiency  . Constipation   . Depression   . Headache   . History of bronchitis    "about twice a year"  . History of hematuria   . Hypertension   . Hypoglycemia   . Osteopenia   . Wears glasses     Patient Active Problem List   Diagnosis Date Noted  . Cervical spondylosis with radiculopathy 06/26/2016    Past Surgical History:  Procedure Laterality Date  . ABDOMINAL HYSTERECTOMY    . ANTERIOR CERVICAL DECOMP/DISCECTOMY FUSION N/A 06/26/2016   Procedure: Cervical Two-Three, Cervical Three-Four, Cervical Four-Five, Cervical Five-Six Anterior Discectomy with Fusion and Plate Fixation;  Surgeon: Ditty, Kevan Ny, MD;  Location: [place];  Service: Neurosurgery;  Laterality: N/A;  C3-6 Anterior discectomy with fusion and plate fixation  . BASAL CELL CARCINOMA EXCISION     on back  . BLEPHAROPLASTY    . CARPAL TUNNEL RELEASE Bilateral   . COLONOSCOPY    . COLONOSCOPY WITH PROPOFOL N/A 10/16/2016   Procedure: COLONOSCOPY WITH PROPOFOL;  Surgeon: Lollie Sails, MD;  Location: [facility] ENDOSCOPY;  Service: Endoscopy;  Laterality:  N/A;  . EYE SURGERY    . NASAL SINUS SURGERY    . TONSILLECTOMY      Prior to Admission medications   Medication Sig Start Date End Date Taking? Authorizing Provider  acetaminophen (TYLENOL) 650 MG CR tablet Take 650 mg by mouth every 8 (eight) hours as needed for pain.    [provider]  albuterol (PROVENTIL HFA;VENTOLIN HFA) 108 (90 Base) MCG/ACT inhaler Inhale 2 puffs into the lungs every 4 (four) hours as needed for wheezing or shortness of breath.  11/28/15 11/27/16  [provider]  amLODipine (NORVASC) 5 MG tablet TAKE 1 .5 TABLET BY MOUTH DAILY. IF SYSTOLIC BP GREATER THAN 573 IN AFTERNOON TAKE EXTRA 1/2 TABLET. 08/19/15   [provider]  cetirizine (ZYRTEC) 10 MG tablet Take 10 mg by mouth daily as needed for allergies.    [provider]  Cholecalciferol (VITAMIN D-1000 MAX ST) 1000 units tablet Take 1,000 Units by mouth daily.     [provider]  docusate sodium (COLACE) 100 MG capsule Take 100 mg by mouth daily as needed for mild constipation.    [provider]  fluticasone (FLONASE) 50 MCG/ACT nasal spray Place 1 spray into the nose daily as needed for allergies.  08/19/15   [provider]  HYDROcodone-acetaminophen (NORCO/VICODIN) 5-325 MG tablet Take 1-2 tablets by mouth every 4 (four) hours as needed. 06/28/16   Ditty, Kevan Ny, MD  methocarbamol (ROBAXIN) 750 MG tablet Take  1 tablet (750 mg total) by mouth every 6 (six) hours as needed for muscle spasms. 06/28/16   Ditty, Kevan Ny, MD  tiZANidine (ZANAFLEX) 2 MG tablet Take 2 mg by mouth every 8 (eight) hours as needed for muscle spasms.    [provider]    Allergies Bisacodyl; Ciprofloxacin; Penicillins; and Sulfa antibiotics  Family History  Problem Relation Age of Onset  . Breast cancer Maternal Aunt   . Breast cancer Cousin     Social History Social History   Tobacco Use  . Smoking status: Never Smoker  . Smokeless tobacco:  Never Used  Substance Use Topics  . Alcohol use: No  . Drug use: No    Review of Systems Constitutional: Negative for fever. Cardiovascular: Negative for chest pain. Respiratory: Negative for shortness of breath. Musculoskeletal: Positive for right  Skin: Negative for open wound or lesion.  Neurological: Negative for decrease in sensation  ____________________________________________   PHYSICAL EXAM:  VITAL SIGNS: ED Triage Vitals  Enc Vitals Group     BP 09/20/17 1415 (!) 119/91     Pulse Rate 09/20/17 1415 74     Resp 09/20/17 1415 18     Temp 09/20/17 1415 98 F (36.7 C)     Temp Source 09/20/17 1415 Oral     SpO2 09/20/17 1415 99 %     Weight 09/20/17 1416 147 lb (66.7 kg)     Height 09/20/17 1416 5' (1.524 m)     Head Circumference --      Peak Flow --      Pain Score 09/20/17 1416 10     Pain Loc --      Pain Edu? --      Excl. in [place]? --     Constitutional: Alert and oriented. Well appearing and in no acute distress. Eyes: Conjunctivae are clear without discharge or drainage Head: Atraumatic Neck: Supple Respiratory: No cough. Respirations are even and unlabored. Musculoskeletal: Pain in popliteal area of right knee. Pain also in the medial aspect of the knee. No effusion noted. Pain radiates into the right calf. Negative Homan's sign. Neurologic: Motor and sensation is intact.  Skin: Warm, dry, intact over exposed skin   Psychiatric: Affect and behavior are appropriate.  ____________________________________________   LABS (all labs ordered are listed, but only abnormal results are displayed)  Labs Reviewed - No data to display ____________________________________________  RADIOLOGY  Image of the right knee is negative for acute bony abnormality per radiology. ____________________________________________   PROCEDURES  Procedures  ____________________________________________   INITIAL IMPRESSION / ASSESSMENT AND PLAN / ED COURSE  Christine Ballard is a 77 y.o. who presents to the emergency department for treatment and evaluation of right knee pain.  Patient states that she saw her primary care provider 2 days ago and was placed on prednisone.  She states that this morning when she woke up the knee felt much better and she had high hopes that the medication was going to relieve her pain.  While watching a movie, she started to bend her knee and heard a pop and has had pain with weightbearing since that time.  Pain is in the back of the right knee and radiates into the right thigh.  Patient and husband aware that the x-ray is negative for acute abnormality and no reason found for pain. Will order ultrasound to rule out Baker's Cyst and DVT. She is not on any type of blood thinner. Care will be relinquished to [facility]  Earleen Newport, PA-C. She will review results with patient and decide on plan of care.  Medications  oxyCODONE (Oxy IR/ROXICODONE) immediate release tablet 5 mg (5 mg Oral Given 09/20/17 1534)    Pertinent labs & imaging results that were available during my care of the patient were reviewed by me and considered in my medical decision making (see chart for details).  _________________________________________   FINAL CLINICAL IMPRESSION(S) / ED DIAGNOSES  Final diagnoses:  Acute pain of right knee    ED Discharge Orders    None       If controlled substance prescribed during this visit, 12 month history viewed on the [place] prior to issuing an initial prescription for Schedule II or III opiod.    Victorino Dike, FNP 09/20/17 1538    Merlyn Lot, MD 09/21/17 [phone]

## 2017-09-24 ENCOUNTER — Other Ambulatory Visit: Payer: Self-pay | Admitting: Orthopedic Surgery

## 2017-09-24 DIAGNOSIS — M25361 Other instability, right knee: Secondary | ICD-10-CM

## 2017-09-24 DIAGNOSIS — M2391 Unspecified internal derangement of right knee: Secondary | ICD-10-CM

## 2017-09-24 DIAGNOSIS — M2351 Chronic instability of knee, right knee: Secondary | ICD-10-CM

## 2017-09-30 ENCOUNTER — Ambulatory Visit
Admission: RE | Admit: 2017-09-30 | Discharge: 2017-09-30 | Disposition: A | Discharge status: Home / Self Care | Payer: Medicare Other | Source: Ambulatory Visit | Attending: Orthopedic Surgery | Admitting: Orthopedic Surgery

## 2017-09-30 DIAGNOSIS — M2391 Unspecified internal derangement of right knee: Secondary | ICD-10-CM | POA: Diagnosis present

## 2017-09-30 DIAGNOSIS — M1711 Unilateral primary osteoarthritis, right knee: Secondary | ICD-10-CM | POA: Diagnosis not present

## 2017-09-30 DIAGNOSIS — S83241A Other tear of medial meniscus, current injury, right knee, initial encounter: Secondary | ICD-10-CM | POA: Insufficient documentation

## 2017-09-30 DIAGNOSIS — M25361 Other instability, right knee: Secondary | ICD-10-CM

## 2017-09-30 DIAGNOSIS — M7121 Synovial cyst of popliteal space [Baker], right knee: Secondary | ICD-10-CM | POA: Insufficient documentation

## 2017-09-30 DIAGNOSIS — M2351 Chronic instability of knee, right knee: Secondary | ICD-10-CM

## 2017-10-09 ENCOUNTER — Ambulatory Visit: Payer: Medicare Other

## 2017-10-10 ENCOUNTER — Ambulatory Visit: Payer: Medicare Other

## 2018-07-08 ENCOUNTER — Other Ambulatory Visit: Payer: Self-pay

## 2018-07-08 ENCOUNTER — Encounter
Admission: RE | Admit: 2018-07-08 | Discharge: 2018-07-08 | Disposition: A | Discharge status: Home / Self Care | Payer: Medicare Other | Source: Ambulatory Visit | Attending: Orthopedic Surgery | Admitting: Orthopedic Surgery

## 2018-07-08 DIAGNOSIS — J449 Chronic obstructive pulmonary disease, unspecified: Secondary | ICD-10-CM | POA: Diagnosis not present

## 2018-07-08 DIAGNOSIS — J309 Allergic rhinitis, unspecified: Secondary | ICD-10-CM | POA: Diagnosis not present

## 2018-07-08 DIAGNOSIS — X58XXXA Exposure to other specified factors, initial encounter: Secondary | ICD-10-CM | POA: Diagnosis not present

## 2018-07-08 DIAGNOSIS — F418 Other specified anxiety disorders: Secondary | ICD-10-CM | POA: Diagnosis not present

## 2018-07-08 DIAGNOSIS — I10 Essential (primary) hypertension: Secondary | ICD-10-CM | POA: Diagnosis not present

## 2018-07-08 DIAGNOSIS — Z1159 Encounter for screening for other viral diseases: Secondary | ICD-10-CM | POA: Diagnosis not present

## 2018-07-08 DIAGNOSIS — S63213A Subluxation of metacarpophalangeal joint of left middle finger, initial encounter: Secondary | ICD-10-CM | POA: Diagnosis not present

## 2018-07-08 DIAGNOSIS — Z85828 Personal history of other malignant neoplasm of skin: Secondary | ICD-10-CM | POA: Diagnosis not present

## 2018-07-08 DIAGNOSIS — Z791 Long term (current) use of non-steroidal anti-inflammatories (NSAID): Secondary | ICD-10-CM | POA: Diagnosis not present

## 2018-07-08 DIAGNOSIS — N189 Chronic kidney disease, unspecified: Secondary | ICD-10-CM | POA: Diagnosis not present

## 2018-07-08 DIAGNOSIS — Z79899 Other long term (current) drug therapy: Secondary | ICD-10-CM | POA: Diagnosis not present

## 2018-07-08 LAB — CBC
HCT: 42.8 % (ref 36.0–46.0)
Hemoglobin: 13.8 g/dL (ref 12.0–15.0)
MCH: 29.1 pg (ref 26.0–34.0)
MCHC: 32.2 g/dL (ref 30.0–36.0)
MCV: 90.3 fL (ref 80.0–100.0)
Platelets: 255 10*3/uL (ref 150–400)
RBC: 4.74 MIL/uL (ref 3.87–5.11)
RDW: 13.3 % (ref 11.5–15.5)
WBC: 8.1 10*3/uL (ref 4.0–10.5)
nRBC: 0 % (ref 0.0–0.2)

## 2018-07-08 LAB — BASIC METABOLIC PANEL
Anion gap: 11 (ref 5–15)
BUN: 18 mg/dL (ref 8–23)
CO2: 23 mmol/L (ref 22–32)
Calcium: 9.1 mg/dL (ref 8.9–10.3)
Chloride: 105 mmol/L (ref 98–111)
Creatinine, Ser: 0.86 mg/dL (ref 0.44–1.00)
GFR calc Af Amer: >60 mL/min (ref >60)
GFR calc non Af Amer: >60 mL/min (ref >60)
Glucose, Bld: 94 mg/dL (ref 70–99)
Potassium: 4.2 mmol/L (ref 3.5–5.1)
Sodium: 139 mmol/L (ref 135–145)

## 2018-07-08 LAB — SARS CORONAVIRUS 2 (TAT 6-24 HRS): SARS Coronavirus 2: NEGATIVE

## 2018-07-08 NOTE — Patient Instructions (Signed)
Your procedure is scheduled on: 07/10/18 Report to Day Surgery.[facility]OND FLOOR To find out your arrival time please call [phone] between 1PM - 3PM on 07/09/18.  Remember: Instructions that are not followed completely may result in serious medical risk,  up to and including death, or upon the discretion of your surgeon and anesthesiologist your  surgery may need to be rescheduled.     _X__ 1. Do not eat food after midnight the night before your procedure.                 No gum chewing or hard candies. You may drink clear liquids up to 2 hours                 before you are scheduled to arrive for your surgery- DO not drink clear                 liquids within 2 hours of the start of your surgery.                 Clear Liquids include:  water, apple juice without pulp, clear carbohydrate                 drink such as Clearfast of Gatorade, Black Coffee or Tea (Do not add                 anything to coffee or tea).  __X__2.  On the morning of surgery brush your teeth with toothpaste and water, you                may rinse your mouth with mouthwash if you wish.  Do not swallow any toothpaste of mouthwash.     _X__ 3.  No Alcohol for 24 hours before or after surgery.   _X__ 4.  Do Not Smoke or use e-cigarettes For 24 Hours Prior to Your Surgery.                 Do not use any chewable tobacco products for at least 6 hours prior to                 surgery.  ____  5.  Bring all medications with you on the day of surgery if instructed.   _X___  6.  Notify your doctor if there is any change in your medical condition      (cold, fever, infections).     Do not wear jewelry, make-up, hairpins, clips or nail polish. Do not wear lotions, powders, or perfumes. You may wear deodorant. Do not shave 48 hours prior to surgery. Men may shave face and neck. Do not bring valuables to the hospital.    [facility] is not responsible for any belongings or  valuables.  Contacts, dentures or bridgework may not be worn into surgery. Leave your suitcase in the car. After surgery it may be brought to your room. For patients admitted to the hospital, discharge time is determined by your treatment team.   Patients discharged the day of surgery will not be allowed to drive home.   Please read over the following fact sheets that you were given:   [place]         _X___ Take these medicines the morning of surgery with A SIP OF WATER:    1. AMLODIPINE  2.   3.   4.  5.  6.  ____ Fleet Enema (as directed)   __X__ Use  CHG Soap as directed  ____ Use inhalers on the day of surgery  ____ Stop metformin 2 days prior to surgery    ____ Take 1/2 of usual insulin dose the night before surgery. No insulin the morning          of surgery.   ____ Stop Coumadin/Plavix/aspirin on   __X__ Stop Anti-inflammatories on  STOP MELOXICAM 07/08/18   ____ Stop supplements until after surgery.    ____ Bring C-Pap to the hospital.

## 2018-07-09 MED ORDER — CLINDAMYCIN PHOSPHATE 900 MG/50ML IV SOLN
900.0000 mg | Freq: Once | INTRAVENOUS | Status: AC
  Administered 2018-07-10: 900 mg via INTRAVENOUS

## 2018-07-10 ENCOUNTER — Other Ambulatory Visit: Payer: Self-pay

## 2018-07-10 ENCOUNTER — Ambulatory Visit
Admission: RE | Admit: 2018-07-10 | Discharge: 2018-07-10 | Disposition: A | Discharge status: Home / Self Care | Payer: Medicare Other | Attending: Orthopedic Surgery | Admitting: Orthopedic Surgery

## 2018-07-10 ENCOUNTER — Ambulatory Visit: Payer: Medicare Other | Admitting: Certified Registered"

## 2018-07-10 ENCOUNTER — Encounter
Admission: RE | Disposition: A | Discharge status: Home / Self Care | Payer: Self-pay | Source: Home / Self Care | Attending: Orthopedic Surgery

## 2018-07-10 ENCOUNTER — Encounter: Payer: Self-pay | Admitting: *Deleted

## 2018-07-10 DIAGNOSIS — Z79899 Other long term (current) drug therapy: Secondary | ICD-10-CM | POA: Insufficient documentation

## 2018-07-10 DIAGNOSIS — X58XXXA Exposure to other specified factors, initial encounter: Secondary | ICD-10-CM | POA: Insufficient documentation

## 2018-07-10 DIAGNOSIS — Z791 Long term (current) use of non-steroidal anti-inflammatories (NSAID): Secondary | ICD-10-CM | POA: Insufficient documentation

## 2018-07-10 DIAGNOSIS — S63213A Subluxation of metacarpophalangeal joint of left middle finger, initial encounter: Secondary | ICD-10-CM | POA: Diagnosis not present

## 2018-07-10 DIAGNOSIS — Z1159 Encounter for screening for other viral diseases: Secondary | ICD-10-CM | POA: Insufficient documentation

## 2018-07-10 DIAGNOSIS — F418 Other specified anxiety disorders: Secondary | ICD-10-CM | POA: Insufficient documentation

## 2018-07-10 DIAGNOSIS — J449 Chronic obstructive pulmonary disease, unspecified: Secondary | ICD-10-CM | POA: Insufficient documentation

## 2018-07-10 DIAGNOSIS — N189 Chronic kidney disease, unspecified: Secondary | ICD-10-CM | POA: Insufficient documentation

## 2018-07-10 DIAGNOSIS — J309 Allergic rhinitis, unspecified: Secondary | ICD-10-CM | POA: Insufficient documentation

## 2018-07-10 DIAGNOSIS — Z85828 Personal history of other malignant neoplasm of skin: Secondary | ICD-10-CM | POA: Insufficient documentation

## 2018-07-10 HISTORY — DX: Gastro-esophageal reflux disease without esophagitis: K21.9

## 2018-07-10 HISTORY — PX: REPAIR EXTENSOR TENDON: SHX5382

## 2018-07-10 LAB — GLUCOSE, CAPILLARY: Glucose-Capillary: 92 mg/dL (ref 70–99)

## 2018-07-10 SURGERY — REPAIR, TENDON, EXTENSOR
Anesthesia: General | Site: Hand | Laterality: Left

## 2018-07-10 MED ORDER — FENTANYL CITRATE (PF) 100 MCG/2ML IJ SOLN
INTRAMUSCULAR | Status: AC
  Filled 2018-07-10: qty 2

## 2018-07-10 MED ORDER — ONDANSETRON HCL 4 MG PO TABS: 4.0000 mg | ORAL_TABLET | Freq: Four times a day (QID) | ORAL | Status: DC | PRN

## 2018-07-10 MED ORDER — PROPOFOL 10 MG/ML IV BOLUS
INTRAVENOUS | Status: DC | PRN
  Administered 2018-07-10: 150 mg via INTRAVENOUS

## 2018-07-10 MED ORDER — FAMOTIDINE 20 MG PO TABS
20.0000 mg | ORAL_TABLET | Freq: Once | ORAL | Status: AC
  Administered 2018-07-10: 20 mg via ORAL

## 2018-07-10 MED ORDER — ONDANSETRON HCL 4 MG/2ML IJ SOLN: 4.0000 mg | Freq: Four times a day (QID) | INTRAMUSCULAR | Status: DC | PRN

## 2018-07-10 MED ORDER — METOCLOPRAMIDE HCL 5 MG/ML IJ SOLN: 5.0000 mg | Freq: Three times a day (TID) | INTRAMUSCULAR | Status: DC | PRN

## 2018-07-10 MED ORDER — LIDOCAINE HCL (CARDIAC) PF 100 MG/5ML IV SOSY
PREFILLED_SYRINGE | INTRAVENOUS | Status: DC | PRN
  Administered 2018-07-10: 80 mg via INTRAVENOUS

## 2018-07-10 MED ORDER — ACETAMINOPHEN 325 MG PO TABS: 325.0000 mg | ORAL_TABLET | Freq: Four times a day (QID) | ORAL | Status: DC | PRN

## 2018-07-10 MED ORDER — CLINDAMYCIN PHOSPHATE 900 MG/50ML IV SOLN
INTRAVENOUS | Status: AC
  Filled 2018-07-10: qty 50

## 2018-07-10 MED ORDER — ONDANSETRON HCL 4 MG/2ML IJ SOLN
INTRAMUSCULAR | Status: DC | PRN
  Administered 2018-07-10: 4 mg via INTRAVENOUS

## 2018-07-10 MED ORDER — LACTATED RINGERS IV SOLN
INTRAVENOUS | Status: DC
  Administered 2018-07-10: 75 mL/h via INTRAVENOUS

## 2018-07-10 MED ORDER — FENTANYL CITRATE (PF) 100 MCG/2ML IJ SOLN: 25.0000 ug | INTRAMUSCULAR | Status: DC | PRN

## 2018-07-10 MED ORDER — METOCLOPRAMIDE HCL 10 MG PO TABS: 5.0000 mg | ORAL_TABLET | Freq: Three times a day (TID) | ORAL | Status: DC | PRN

## 2018-07-10 MED ORDER — FENTANYL CITRATE (PF) 100 MCG/2ML IJ SOLN
INTRAMUSCULAR | Status: DC | PRN
  Administered 2018-07-10 (×2): 25 ug via INTRAVENOUS

## 2018-07-10 MED ORDER — HYDROCODONE-ACETAMINOPHEN 5-325 MG PO TABS: 1.0000 | ORAL_TABLET | ORAL | Status: DC | PRN

## 2018-07-10 MED ORDER — SODIUM CHLORIDE 0.9 % IV SOLN: INTRAVENOUS | Status: DC

## 2018-07-10 MED ORDER — BUPIVACAINE HCL (PF) 0.5 % IJ SOLN
INTRAMUSCULAR | Status: DC | PRN
  Administered 2018-07-10: 10 mL

## 2018-07-10 MED ORDER — FAMOTIDINE 20 MG PO TABS
ORAL_TABLET | ORAL | Status: AC
  Administered 2018-07-10: 20 mg via ORAL
  Filled 2018-07-10: qty 1

## 2018-07-10 MED ORDER — ONDANSETRON HCL 4 MG/2ML IJ SOLN: 4.0000 mg | Freq: Once | INTRAMUSCULAR | Status: DC | PRN

## 2018-07-10 MED ORDER — ACETAMINOPHEN 10 MG/ML IV SOLN
INTRAVENOUS | Status: AC
  Filled 2018-07-10: qty 100

## 2018-07-10 MED ORDER — ACETAMINOPHEN 500 MG PO TABS: 500.0000 mg | ORAL_TABLET | Freq: Four times a day (QID) | ORAL | Status: DC

## 2018-07-10 MED ORDER — TRAMADOL HCL 50 MG PO TABS
50.0000 mg | ORAL_TABLET | Freq: Four times a day (QID) | ORAL | Status: DC
  Filled 2018-07-10: qty 1

## 2018-07-10 MED ORDER — HYDROCODONE-ACETAMINOPHEN 7.5-325 MG PO TABS
1.0000 | ORAL_TABLET | ORAL | Status: DC | PRN
  Filled 2018-07-10: qty 2

## 2018-07-10 MED ORDER — PROPOFOL 10 MG/ML IV BOLUS
INTRAVENOUS | Status: AC
  Filled 2018-07-10: qty 20

## 2018-07-10 MED ORDER — HYDROCODONE-ACETAMINOPHEN 5-325 MG PO TABS: 1.0000 | ORAL_TABLET | Freq: Four times a day (QID) | ORAL | 0 refills | Status: DC | PRN

## 2018-07-10 MED ORDER — NEOMYCIN-POLYMYXIN B GU 40-200000 IR SOLN
Status: DC | PRN
  Administered 2018-07-10: 2 mL

## 2018-07-10 MED ORDER — MORPHINE SULFATE (PF) 4 MG/ML IV SOLN: 0.5000 mg | INTRAVENOUS | Status: DC | PRN

## 2018-07-10 SURGICAL SUPPLY — 39 items
BANDAGE ELASTIC 4 LF NS (GAUZE/BANDAGES/DRESSINGS) ×3 IMPLANT
BNDG ESMARK 4X12 TAN STRL LF (GAUZE/BANDAGES/DRESSINGS) ×3 IMPLANT
CANISTER SUCT 1200ML W/VALVE (MISCELLANEOUS) ×3 IMPLANT
CHLORAPREP W/TINT 26 (MISCELLANEOUS) ×3 IMPLANT
COVER WAND RF STERILE (DRAPES) ×3 IMPLANT
CUFF DUAL TOURNIQUET 18IN DISP (TOURNIQUET CUFF) IMPLANT
CUFF TOURN 24 STER (MISCELLANEOUS) IMPLANT
CUFF TOURN SGL QUICK 12 (TOURNIQUET CUFF) ×2 IMPLANT
ELECT CAUTERY BLADE 6.4 (BLADE) ×3 IMPLANT
ELECT REM PT RETURN 9FT ADLT (ELECTROSURGICAL) ×3
ELECTRODE REM PT RTRN 9FT ADLT (ELECTROSURGICAL) ×1 IMPLANT
GAUZE SPONGE 4X4 12PLY STRL (GAUZE/BANDAGES/DRESSINGS) ×3 IMPLANT
GAUZE XEROFORM 1X8 LF (GAUZE/BANDAGES/DRESSINGS) ×3 IMPLANT
GLOVE BIOGEL PI IND STRL 9 (GLOVE) ×1 IMPLANT
GLOVE BIOGEL PI INDICATOR 9 (GLOVE) ×2
GLOVE SURG SYN 9.0  PF PI (GLOVE) ×2
GLOVE SURG SYN 9.0 PF PI (GLOVE) ×1 IMPLANT
GOWN SRG 2XL LVL 4 RGLN SLV (GOWNS) ×1 IMPLANT
GOWN STRL NON-REIN 2XL LVL4 (GOWNS) ×2
GOWN STRL REUS W/ TWL LRG LVL3 (GOWN DISPOSABLE) ×1 IMPLANT
GOWN STRL REUS W/TWL LRG LVL3 (GOWN DISPOSABLE) ×2
KIT TURNOVER KIT A (KITS) ×3 IMPLANT
NDL HYPO 25X1 1.5 SAFETY (NEEDLE) ×1 IMPLANT
NEEDLE HYPO 25X1 1.5 SAFETY (NEEDLE) ×3 IMPLANT
NS IRRIG 500ML POUR BTL (IV SOLUTION) ×3 IMPLANT
PACK EXTREMITY ARMC (MISCELLANEOUS) ×3 IMPLANT
PAD CAST CTTN 4X4 STRL (SOFTGOODS) ×1 IMPLANT
PADDING CAST COTTON 4X4 STRL (SOFTGOODS) ×2
SPLINT CAST 1 STEP 4X15 (MISCELLANEOUS) ×3 IMPLANT
STOCKINETTE 48X4 2 PLY STRL (GAUZE/BANDAGES/DRESSINGS) ×1 IMPLANT
STOCKINETTE STRL 4IN 9604848 (GAUZE/BANDAGES/DRESSINGS) ×3 IMPLANT
SUT ETHILON 4 0 P 3 18 (SUTURE) ×6 IMPLANT
SUT ETHILON 4-0 (SUTURE) ×2
SUT ETHILON 4-0 FS2 18XMFL BLK (SUTURE) ×1
SUT ETHILON 5 0 P 3 18 (SUTURE) ×2
SUT MERSILENE 4-0 WHT RB-1 (SUTURE) ×3 IMPLANT
SUT NYLON ETHILON 5-0 P-3 1X18 (SUTURE) ×1 IMPLANT
SUTURE ETHLN 4-0 FS2 18XMF BLK (SUTURE) ×1 IMPLANT
SYR 10ML LL (SYRINGE) ×3 IMPLANT

## 2018-07-10 NOTE — Anesthesia Procedure Notes (Signed)
Procedure Name: Intubation Date/Time: 07/10/2018 12:49 PM Performed by: Chanetta Marshall, CRNA Pre-anesthesia Checklist: Patient identified, Emergency Drugs available, Suction available and Patient being monitored Patient Re-evaluated:Patient Re-evaluated prior to induction Oxygen Delivery Method: Circle system utilized Preoxygenation: Pre-oxygenation with 100% oxygen Induction Type: IV induction Ventilation: Mask ventilation without difficulty LMA: LMA inserted LMA Size: 3.5 Number of attempts: 1 Airway Equipment and Method: Oral airway Placement Confirmation: positive ETCO2 and breath sounds checked- equal and bilateral Tube secured with: Tape Dental Injury: Teeth and Oropharynx as per pre-operative assessment

## 2018-07-10 NOTE — OR Nursing (Signed)
Patient states that she recently had a tumble and injured right calf.  Scabbed area about the size of a half dollar that is red around the perimeter.  A pink bumper pad placed over wound. Dr. Rudene Christians aware and saw the site.  Patient denies discomfort in the area unless pressure applied.

## 2018-07-10 NOTE — Discharge Instructions (Signed)
Keep splint clean and dry until return visit.  Keep arm elevated to minimize pain.  Pain medicine as directed.

## 2018-07-10 NOTE — Anesthesia Post-op Follow-up Note (Signed)
Anesthesia QCDR form completed.        

## 2018-07-10 NOTE — Anesthesia Postprocedure Evaluation (Signed)
Anesthesia Post Note  Patient: Christine Ballard  Procedure(s) Performed: REPAIR EXTENSOR TENDON LEFT LONG FINGER REALIGNMENT (Left Hand)  Patient location during evaluation: PACU Anesthesia Type: General Level of consciousness: awake and alert and oriented Pain management: pain level controlled Vital Signs Assessment: post-procedure vital signs reviewed and stable Respiratory status: spontaneous breathing Cardiovascular status: blood pressure returned to baseline Anesthetic complications: no     Last Vitals:  Vitals:   07/10/18 1418 07/10/18 1502  BP: (!) 180/80 (!) 154/76  Pulse: 67 72  Resp: 18 18  Temp: 36.8 C   SpO2:  98%    Last Pain:  Vitals:   07/10/18 1502  TempSrc:   PainSc: 0-No pain                 Madaline Lefeber

## 2018-07-10 NOTE — Op Note (Signed)
07/10/2018  1:18 PM  PATIENT:  Christine Ballard  78 y.o. female  PRE-OPERATIVE DIAGNOSIS:  PAIN IN JOINT LEFT HAND with extensor tendon subluxation left middle finger  POST-OPERATIVE DIAGNOSIS:  PAIN IN JOINT LEFT HAND same  PROCEDURE:  Procedure(s): REPAIR EXTENSOR TENDON LEFT LONG FINGER REALIGNMENT (Left)  SURGEON: Laurene Footman, MD  ASSISTANTS: none  ANESTHESIA:   general  EBL:  Total I/O In: -  Out: 5 [Blood:5]  BLOOD ADMINISTERED:none  DRAINS: none   LOCAL MEDICATIONS USED:  MARCAINE     SPECIMEN:  No Specimen  DISPOSITION OF SPECIMEN:  N/A  COUNTS:  YES  TOURNIQUET:   Total Tourniquet Time Documented: Forearm (Left) - 19 minutes Total: Forearm (Left) - 19 minutes   IMPLANTS: none  DICTATION: .Dragon Dictation patient was brought to the operating room and after adequate anesthesia was obtained the left arm was prepped and draped in the usual sterile fashion.  A tourniquet had been applied to the upper forearm and after patient identification and timeout procedures were completed tourniquet was raised.  Curvilinear incision was made over the long finger MCP joint radially based.  There was some scar tissue that are reformed over the dorsum of the extensor tendon this was debrided the extensor tendon was then the ulnar gutter and released on the ulnar side had to be performed to allow to get to the midline.  There was adequate tissue on the radial side to do a direct repair.  4-0 Vicryl multiple sutures in a figure-of-eight fashion were applied and the tendon was in the midline through full range of motion.  After this repaired been completed the wound was thoroughly irrigated and 10 cc of half percent Sensorcaine was placed around the incision for postop analgesia.  Xeroform 4 x 4's web roll and a dorsal splint with the MCP joints at about 70 degrees flexion was applied followed by an Ace wrap and patient sent to recovery in stable condition  PLAN OF CARE: Discharge to  home after PACU  PATIENT DISPOSITION:  PACU - hemodynamically stable.

## 2018-07-10 NOTE — Transfer of Care (Signed)
Immediate Anesthesia Transfer of Care Note  Patient: Christine Ballard  Procedure(s) Performed: REPAIR EXTENSOR TENDON LEFT LONG FINGER REALIGNMENT (Left Hand)  Patient Location: PACU  Anesthesia Type:General  Level of Consciousness: awake  Airway & Oxygen Therapy: Patient Spontanous Breathing and Patient connected to face mask oxygen  Post-op Assessment: Report given to RN and Post -op Vital signs reviewed and stable  Post vital signs: Reviewed and stable  Last Vitals:  Vitals Value Taken Time  BP 155/82 07/10/18 1318  Temp    Pulse 65 07/10/18 1319  Resp 13 07/10/18 1319  SpO2 100 % 07/10/18 1319  Vitals shown include unvalidated device data.  Last Pain:  Vitals:   07/10/18 1116  TempSrc: Tympanic  PainSc: 0-No pain      Patients Stated Pain Goal: 0 (62/95/28 4132)  Complications: No apparent anesthesia complications

## 2018-07-10 NOTE — Anesthesia Preprocedure Evaluation (Signed)
Anesthesia Evaluation  Patient identified by MRN, date of birth, ID band Patient awake    Reviewed: Allergy & Precautions, H&P , NPO status , Patient's Chart, lab work & pertinent test results  History of Anesthesia Complications Negative for: history of anesthetic complications  Airway Mallampati: III  TM Distance: <3 FB Neck ROM: limited    Dental  (+) Chipped   Pulmonary neg shortness of breath, asthma ,           Cardiovascular Exercise Tolerance: Good hypertension, (-) angina(-) Past MI and (-) DOE      Neuro/Psych  Headaches, PSYCHIATRIC DISORDERS Anxiety Depression  Neuromuscular disease    GI/Hepatic negative GI ROS, Neg liver ROS, neg GERD  ,  Endo/Other  negative endocrine ROS  Renal/GU Renal InsufficiencyRenal disease  negative genitourinary   Musculoskeletal  (+) Arthritis ,   Abdominal   Peds  Hematology negative hematology ROS (+)   Anesthesia Other Findings Past Medical History: No date: Allergic rhinitis No date: Allergy-induced asthma No date: Anxiety No date: Arthritis     Comment:  chronic hip pain,right No date: Cataracts, bilateral     Comment:  "small" No date: Chronic kidney disease     Comment:  mild renal insufficiency No date: Constipation No date: Depression No date: Headache No date: History of bronchitis     Comment:  "about twice a year" No date: History of hematuria No date: Hypertension No date: Hypoglycemia No date: Osteopenia No date: Wears glasses  Past Surgical History: No date: ABDOMINAL HYSTERECTOMY 06/26/2016: ANTERIOR CERVICAL DECOMP/DISCECTOMY FUSION; N/A     Comment:  Procedure: Cervical Two-Three, Cervical Three-Four,               Cervical Four-Five, Cervical Five-Six Anterior Discectomy              with Fusion and Plate Fixation;  Surgeon: Ditty, Kevan Ny, MD;  Location: [place];  Service: Neurosurgery;                Laterality: N/A;   C3-6 Anterior discectomy with fusion               and plate fixation No date: BASAL CELL CARCINOMA EXCISION     Comment:  on back No date: BLEPHAROPLASTY No date: CARPAL TUNNEL RELEASE; Bilateral No date: COLONOSCOPY No date: EYE SURGERY No date: NASAL SINUS SURGERY No date: TONSILLECTOMY     Reproductive/Obstetrics negative OB ROS                             Anesthesia Physical  Anesthesia Plan  ASA: III  Anesthesia Plan: General   Post-op Pain Management:    Induction: Intravenous  PONV Risk Score and Plan: Propofol infusion  Airway Management Planned: LMA  Additional Equipment:   Intra-op Plan:   Post-operative Plan: Extubation in OR  Informed Consent: I have reviewed the patients History and Physical, chart, labs and discussed the procedure including the risks, benefits and alternatives for the proposed anesthesia with the patient or authorized representative who has indicated his/her understanding and acceptance.     Dental Advisory Given  Plan Discussed with: Anesthesiologist, CRNA and Surgeon  Anesthesia Plan Comments: (Patient consented for risks of anesthesia including but not limited to:  - adverse reactions to medications - risk of intubation if required - damage to teeth, lips or other oral mucosa -  sore throat or hoarseness - Damage to heart, brain, lungs or loss of life  Patient voiced understanding.)        Anesthesia Quick Evaluation

## 2018-07-10 NOTE — H&P (Signed)
Reviewed paper H+P, will be scanned into chart. No changes noted.  

## 2018-07-11 ENCOUNTER — Encounter: Payer: Self-pay | Admitting: Orthopedic Surgery

## 2018-08-11 ENCOUNTER — Other Ambulatory Visit: Payer: Self-pay | Admitting: Infectious Diseases

## 2018-08-11 DIAGNOSIS — Z1231 Encounter for screening mammogram for malignant neoplasm of breast: Secondary | ICD-10-CM

## 2018-09-25 ENCOUNTER — Ambulatory Visit
Admission: RE | Admit: 2018-09-25 | Discharge: 2018-09-25 | Disposition: A | Discharge status: Home / Self Care | Payer: Medicare Other | Source: Ambulatory Visit | Attending: Infectious Diseases | Admitting: Infectious Diseases

## 2018-09-25 DIAGNOSIS — Z1231 Encounter for screening mammogram for malignant neoplasm of breast: Secondary | ICD-10-CM | POA: Insufficient documentation

## 2019-01-27 ENCOUNTER — Other Ambulatory Visit: Payer: Self-pay

## 2019-01-27 ENCOUNTER — Encounter: Payer: Self-pay | Admitting: Ophthalmology

## 2019-01-30 ENCOUNTER — Other Ambulatory Visit
Admission: RE | Admit: 2019-01-30 | Discharge: 2019-01-30 | Disposition: A | Discharge status: Home / Self Care | Payer: Medicare PPO | Source: Ambulatory Visit | Attending: Ophthalmology | Admitting: Ophthalmology

## 2019-01-30 ENCOUNTER — Other Ambulatory Visit: Payer: Self-pay

## 2019-01-30 DIAGNOSIS — Z20822 Contact with and (suspected) exposure to covid-19: Secondary | ICD-10-CM | POA: Diagnosis not present

## 2019-01-30 DIAGNOSIS — Z01812 Encounter for preprocedural laboratory examination: Secondary | ICD-10-CM | POA: Insufficient documentation

## 2019-01-30 LAB — SARS CORONAVIRUS 2 (TAT 6-24 HRS): SARS Coronavirus 2: NEGATIVE

## 2019-01-30 NOTE — Discharge Instructions (Signed)

## 2019-02-03 ENCOUNTER — Ambulatory Visit
Admission: RE | Admit: 2019-02-03 | Discharge: 2019-02-03 | Disposition: A | Discharge status: Home / Self Care | Payer: Medicare PPO | Attending: Ophthalmology | Admitting: Ophthalmology

## 2019-02-03 ENCOUNTER — Encounter
Admission: RE | Disposition: A | Discharge status: Home / Self Care | Payer: Self-pay | Source: Home / Self Care | Attending: Ophthalmology

## 2019-02-03 ENCOUNTER — Other Ambulatory Visit: Payer: Self-pay

## 2019-02-03 ENCOUNTER — Ambulatory Visit: Payer: Medicare PPO | Admitting: Anesthesiology

## 2019-02-03 ENCOUNTER — Encounter: Payer: Self-pay | Admitting: Ophthalmology

## 2019-02-03 DIAGNOSIS — Z881 Allergy status to other antibiotic agents status: Secondary | ICD-10-CM | POA: Insufficient documentation

## 2019-02-03 DIAGNOSIS — Z880 Allergy status to penicillin: Secondary | ICD-10-CM | POA: Diagnosis not present

## 2019-02-03 DIAGNOSIS — I129 Hypertensive chronic kidney disease with stage 1 through stage 4 chronic kidney disease, or unspecified chronic kidney disease: Secondary | ICD-10-CM | POA: Insufficient documentation

## 2019-02-03 DIAGNOSIS — K219 Gastro-esophageal reflux disease without esophagitis: Secondary | ICD-10-CM | POA: Insufficient documentation

## 2019-02-03 DIAGNOSIS — H2511 Age-related nuclear cataract, right eye: Secondary | ICD-10-CM | POA: Diagnosis not present

## 2019-02-03 DIAGNOSIS — J439 Emphysema, unspecified: Secondary | ICD-10-CM | POA: Insufficient documentation

## 2019-02-03 DIAGNOSIS — N189 Chronic kidney disease, unspecified: Secondary | ICD-10-CM | POA: Diagnosis not present

## 2019-02-03 DIAGNOSIS — F418 Other specified anxiety disorders: Secondary | ICD-10-CM | POA: Diagnosis not present

## 2019-02-03 DIAGNOSIS — Z6829 Body mass index (BMI) 29.0-29.9, adult: Secondary | ICD-10-CM | POA: Diagnosis not present

## 2019-02-03 DIAGNOSIS — Z888 Allergy status to other drugs, medicaments and biological substances status: Secondary | ICD-10-CM | POA: Insufficient documentation

## 2019-02-03 DIAGNOSIS — Z882 Allergy status to sulfonamides status: Secondary | ICD-10-CM | POA: Diagnosis not present

## 2019-02-03 DIAGNOSIS — Z7951 Long term (current) use of inhaled steroids: Secondary | ICD-10-CM | POA: Diagnosis not present

## 2019-02-03 DIAGNOSIS — M1990 Unspecified osteoarthritis, unspecified site: Secondary | ICD-10-CM | POA: Insufficient documentation

## 2019-02-03 DIAGNOSIS — Z79899 Other long term (current) drug therapy: Secondary | ICD-10-CM | POA: Diagnosis not present

## 2019-02-03 HISTORY — DX: Chronic obstructive pulmonary disease, unspecified: J44.9

## 2019-02-03 HISTORY — DX: Dizziness and giddiness: R42

## 2019-02-03 HISTORY — DX: Unspecified tear of unspecified meniscus, current injury, unspecified knee, initial encounter: S83.209A

## 2019-02-03 HISTORY — PX: CATARACT EXTRACTION W/PHACO: SHX586

## 2019-02-03 HISTORY — DX: Dyspnea, unspecified: R06.00

## 2019-02-03 SURGERY — PHACOEMULSIFICATION, CATARACT, WITH IOL INSERTION
Anesthesia: Monitor Anesthesia Care | Site: Eye | Laterality: Right

## 2019-02-03 MED ORDER — FENTANYL CITRATE (PF) 100 MCG/2ML IJ SOLN
INTRAMUSCULAR | Status: DC | PRN
  Administered 2019-02-03: 50 ug via INTRAVENOUS

## 2019-02-03 MED ORDER — ARMC OPHTHALMIC DILATING DROPS
1.0000 "application " | OPHTHALMIC | Status: AC | PRN
  Administered 2019-02-03 (×3): 1 via OPHTHALMIC

## 2019-02-03 MED ORDER — NA CHONDROIT SULF-NA HYALURON 40-17 MG/ML IO SOLN
INTRAOCULAR | Status: DC | PRN
  Administered 2019-02-03: 1 mL via INTRAOCULAR

## 2019-02-03 MED ORDER — MOXIFLOXACIN HCL 0.5 % OP SOLN
OPHTHALMIC | Status: DC | PRN
  Administered 2019-02-03: 0.2 mL via OPHTHALMIC

## 2019-02-03 MED ORDER — EPINEPHRINE PF 1 MG/ML IJ SOLN
INTRAOCULAR | Status: DC | PRN
  Administered 2019-02-03: 56 mL via OPHTHALMIC

## 2019-02-03 MED ORDER — OXYCODONE HCL 5 MG PO TABS: 5.0000 mg | ORAL_TABLET | Freq: Once | ORAL | Status: DC | PRN

## 2019-02-03 MED ORDER — LACTATED RINGERS IV SOLN: INTRAVENOUS | Status: DC

## 2019-02-03 MED ORDER — LIDOCAINE HCL (PF) 2 % IJ SOLN
INTRAOCULAR | Status: DC | PRN
  Administered 2019-02-03: 2 mL

## 2019-02-03 MED ORDER — MIDAZOLAM HCL 2 MG/2ML IJ SOLN
INTRAMUSCULAR | Status: DC | PRN
  Administered 2019-02-03: 2 mg via INTRAVENOUS

## 2019-02-03 MED ORDER — OXYCODONE HCL 5 MG/5ML PO SOLN: 5.0000 mg | Freq: Once | ORAL | Status: DC | PRN

## 2019-02-03 MED ORDER — TETRACAINE HCL 0.5 % OP SOLN
1.0000 [drp] | OPHTHALMIC | Status: DC | PRN
  Administered 2019-02-03 (×3): 1 [drp] via OPHTHALMIC

## 2019-02-03 SURGICAL SUPPLY — 17 items
CANNULA ANT/CHMB 27GA (MISCELLANEOUS) ×6 IMPLANT
GLOVE SURG LX 8.0 MICRO (GLOVE) ×2
GLOVE SURG LX STRL 8.0 MICRO (GLOVE) ×1 IMPLANT
GLOVE SURG TRIUMPH 8.0 PF LTX (GLOVE) ×3 IMPLANT
GOWN STRL REUS W/ TWL LRG LVL3 (GOWN DISPOSABLE) ×2 IMPLANT
GOWN STRL REUS W/TWL LRG LVL3 (GOWN DISPOSABLE) ×4
LENS IOL TECNIS ITEC 22.5 (Intraocular Lens) ×3 IMPLANT
MARKER SKIN DUAL TIP RULER LAB (MISCELLANEOUS) ×3 IMPLANT
NEEDLE FILTER BLUNT 18X 1/2SAF (NEEDLE) ×2
NEEDLE FILTER BLUNT 18X1 1/2 (NEEDLE) ×1 IMPLANT
PACK EYE AFTER SURG (MISCELLANEOUS) ×3 IMPLANT
PACK OPTHALMIC (MISCELLANEOUS) ×3 IMPLANT
PACK PORFILIO (MISCELLANEOUS) ×3 IMPLANT
SYR 3ML LL SCALE MARK (SYRINGE) ×3 IMPLANT
SYR TB 1ML LUER SLIP (SYRINGE) ×3 IMPLANT
WATER STERILE IRR 250ML POUR (IV SOLUTION) ×3 IMPLANT
WIPE NON LINTING 3.25X3.25 (MISCELLANEOUS) ×3 IMPLANT

## 2019-02-03 NOTE — Anesthesia Procedure Notes (Signed)
Procedure Name: MAC Performed by: Donatella Walski, CRNA Pre-anesthesia Checklist: Patient identified, Emergency Drugs available, Suction available, Timeout performed and Patient being monitored Patient Re-evaluated:Patient Re-evaluated prior to induction Oxygen Delivery Method: Nasal cannula Placement Confirmation: positive ETCO2       

## 2019-02-03 NOTE — Anesthesia Preprocedure Evaluation (Signed)
Anesthesia Evaluation  Patient identified by MRN, date of birth, ID band Patient awake    Reviewed: NPO status   History of Anesthesia Complications Negative for: history of anesthetic complications  Airway Mallampati: II  TM Distance: >3 FB Neck ROM: full    Dental no notable dental hx.    Pulmonary COPD,  COPD inhaler,  Chronic bronchitis without COPD per PFT   Ottie Glazier, MD at 01/28/2019 : pulm   Pulmonary exam normal        Cardiovascular Exercise Tolerance: Good hypertension, Normal cardiovascular exam  echo: 08/2018: NORMAL LEFT VENTRICULAR SYSTOLIC FUNCTION NORMAL RIGHT VENTRICULAR SYSTOLIC FUNCTION MILD VALVULAR REGURGITATION (See above) NO VALVULAR STENOSIS;     Neuro/Psych  Headaches (ocular), Anxiety Depression    GI/Hepatic Neg liver ROS, GERD  Controlled,  Endo/Other  Morbid obesity (bmi 29)  Renal/GU negative Renal ROS  negative genitourinary   Musculoskeletal  (+) Arthritis ,   Abdominal   Peds  Hematology negative hematology ROS (+)   Anesthesia Other Findings Covid: NEG.  acdf : c2-6  Reproductive/Obstetrics                             Anesthesia Physical Anesthesia Plan  ASA: III  Anesthesia Plan: MAC   Post-op Pain Management:    Induction:   PONV Risk Score and Plan:   Airway Management Planned:   Additional Equipment:   Intra-op Plan:   Post-operative Plan:   Informed Consent: I have reviewed the patients History and Physical, chart, labs and discussed the procedure including the risks, benefits and alternatives for the proposed anesthesia with the patient or authorized representative who has indicated his/her understanding and acceptance.       Plan Discussed with: CRNA  Anesthesia Plan Comments:         Anesthesia Quick Evaluation

## 2019-02-03 NOTE — Op Note (Signed)
PREOPERATIVE DIAGNOSIS:  Nuclear sclerotic cataract of the right eye.   POSTOPERATIVE DIAGNOSIS:  H25.11 Cataract   OPERATIVE PROCEDURE:@   SURGEON:  Birder Robson, MD.   ANESTHESIA:  Anesthesiologist: Fidel Levy, MD CRNA: Mayme Genta, CRNA  1.      Managed anesthesia care. 2.      0.73ml of Shugarcaine was instilled in the eye following the paracentesis.   COMPLICATIONS:  None.   TECHNIQUE:   Stop and chop   DESCRIPTION OF PROCEDURE:  The patient was examined and consented in the preoperative holding area where the aforementioned topical anesthesia was applied to the right eye and then brought back to the Operating Room where the right eye was prepped and draped in the usual sterile ophthalmic fashion and a lid speculum was placed. A paracentesis was created with the side port blade and the anterior chamber was filled with viscoelastic. A near clear corneal incision was performed with the steel keratome. A continuous curvilinear capsulorrhexis was performed with a cystotome followed by the capsulorrhexis forceps. Hydrodissection and hydrodelineation were carried out with BSS on a blunt cannula. The lens was removed in a stop and chop  technique and the remaining cortical material was removed with the irrigation-aspiration handpiece. The capsular bag was inflated with viscoelastic and the Technis [id number]  lens was placed in the capsular bag without complication. The remaining viscoelastic was removed from the eye with the irrigation-aspiration handpiece. The wounds were hydrated. The anterior chamber was flushed with BSS and the eye was inflated to physiologic pressure. 0.90ml of Vigamox was placed in the anterior chamber. The wounds were found to be water tight. The eye was dressed with Combigan. The patient was given protective glasses to wear throughout the day and a shield with which to sleep tonight. The patient was also given drops with which to begin a drop regimen today and will  follow-up with me in one day. Implant Name Type Inv. Item Serial No. Manufacturer Lot No. LRB No. Used Action  LENS IOL DIOP 22.5 - [id number] Intraocular Lens LENS IOL DIOP 22.5 [id number] [id number]  Right 1 Implanted   Procedure(s): CATARACT EXTRACTION PHACO AND INTRAOCULAR LENS PLACEMENT (IOC) RIGHT 5.39 00:38.1 (Right)  Electronically signed: Birder Robson 02/03/2019 10:19 AM

## 2019-02-03 NOTE — Anesthesia Postprocedure Evaluation (Signed)
Anesthesia Post Note  Patient: Christine Ballard  Procedure(s) Performed: CATARACT EXTRACTION PHACO AND INTRAOCULAR LENS PLACEMENT (IOC) RIGHT 5.39 00:38.1 (Right Eye)     Patient location during evaluation: PACU Anesthesia Type: MAC Level of consciousness: awake and alert Pain management: pain level controlled Vital Signs Assessment: post-procedure vital signs reviewed and stable Respiratory status: spontaneous breathing, nonlabored ventilation, respiratory function stable and patient connected to nasal cannula oxygen Cardiovascular status: stable and blood pressure returned to baseline Postop Assessment: no apparent nausea or vomiting Anesthetic complications: no    Nedim Oki

## 2019-02-03 NOTE — H&P (Signed)
All labs reviewed. Abnormal studies sent to patients PCP when indicated.  Previous H&P reviewed, patient examined, there are NO CHANGES.  Christine Pates Porfilio2/2/20219:54 AM

## 2019-02-03 NOTE — Transfer of Care (Signed)
Immediate Anesthesia Transfer of Care Note  Patient: Christine Ballard  Procedure(s) Performed: CATARACT EXTRACTION PHACO AND INTRAOCULAR LENS PLACEMENT (IOC) RIGHT 5.39 00:38.1 (Right Eye)  Patient Location: PACU  Anesthesia Type: MAC  Level of Consciousness: awake, alert  and patient cooperative  Airway and Oxygen Therapy: Patient Spontanous Breathing and Patient connected to supplemental oxygen  Post-op Assessment: Post-op Vital signs reviewed, Patient's Cardiovascular Status Stable, Respiratory Function Stable, Patent Airway and No signs of Nausea or vomiting  Post-op Vital Signs: Reviewed and stable  Complications: No apparent anesthesia complications

## 2019-02-04 ENCOUNTER — Encounter: Payer: Self-pay | Admitting: *Deleted

## 2019-02-16 ENCOUNTER — Encounter: Payer: Self-pay | Admitting: Ophthalmology

## 2019-02-20 ENCOUNTER — Other Ambulatory Visit
Admission: RE | Admit: 2019-02-20 | Discharge: 2019-02-20 | Disposition: A | Discharge status: Home / Self Care | Payer: Medicare PPO | Source: Ambulatory Visit | Attending: Ophthalmology | Admitting: Ophthalmology

## 2019-02-20 DIAGNOSIS — Z01812 Encounter for preprocedural laboratory examination: Secondary | ICD-10-CM | POA: Insufficient documentation

## 2019-02-20 DIAGNOSIS — Z20822 Contact with and (suspected) exposure to covid-19: Secondary | ICD-10-CM | POA: Insufficient documentation

## 2019-02-20 LAB — SARS CORONAVIRUS 2 (TAT 6-24 HRS): SARS Coronavirus 2: NEGATIVE

## 2019-02-20 NOTE — Discharge Instructions (Signed)

## 2019-02-24 ENCOUNTER — Ambulatory Visit
Admission: RE | Admit: 2019-02-24 | Discharge: 2019-02-24 | Disposition: A | Discharge status: Home / Self Care | Payer: Medicare PPO | Attending: Ophthalmology | Admitting: Ophthalmology

## 2019-02-24 ENCOUNTER — Ambulatory Visit: Payer: Medicare PPO | Admitting: Anesthesiology

## 2019-02-24 ENCOUNTER — Encounter: Payer: Self-pay | Admitting: Ophthalmology

## 2019-02-24 ENCOUNTER — Encounter
Admission: RE | Disposition: A | Discharge status: Home / Self Care | Payer: Self-pay | Source: Home / Self Care | Attending: Ophthalmology

## 2019-02-24 ENCOUNTER — Other Ambulatory Visit: Payer: Self-pay

## 2019-02-24 DIAGNOSIS — F419 Anxiety disorder, unspecified: Secondary | ICD-10-CM | POA: Insufficient documentation

## 2019-02-24 DIAGNOSIS — Z6829 Body mass index (BMI) 29.0-29.9, adult: Secondary | ICD-10-CM | POA: Insufficient documentation

## 2019-02-24 DIAGNOSIS — J439 Emphysema, unspecified: Secondary | ICD-10-CM | POA: Insufficient documentation

## 2019-02-24 DIAGNOSIS — I10 Essential (primary) hypertension: Secondary | ICD-10-CM | POA: Insufficient documentation

## 2019-02-24 DIAGNOSIS — Z85828 Personal history of other malignant neoplasm of skin: Secondary | ICD-10-CM | POA: Insufficient documentation

## 2019-02-24 DIAGNOSIS — H2512 Age-related nuclear cataract, left eye: Secondary | ICD-10-CM | POA: Insufficient documentation

## 2019-02-24 DIAGNOSIS — F329 Major depressive disorder, single episode, unspecified: Secondary | ICD-10-CM | POA: Insufficient documentation

## 2019-02-24 DIAGNOSIS — K219 Gastro-esophageal reflux disease without esophagitis: Secondary | ICD-10-CM | POA: Insufficient documentation

## 2019-02-24 HISTORY — PX: CATARACT EXTRACTION W/PHACO: SHX586

## 2019-02-24 SURGERY — PHACOEMULSIFICATION, CATARACT, WITH IOL INSERTION
Anesthesia: Monitor Anesthesia Care | Site: Eye | Laterality: Left

## 2019-02-24 MED ORDER — LIDOCAINE HCL (PF) 2 % IJ SOLN
INTRAOCULAR | Status: DC | PRN
  Administered 2019-02-24: 12:00:00 1 mL

## 2019-02-24 MED ORDER — EPINEPHRINE PF 1 MG/ML IJ SOLN
INTRAOCULAR | Status: DC | PRN
  Administered 2019-02-24: 39 mL via OPHTHALMIC

## 2019-02-24 MED ORDER — BRIMONIDINE TARTRATE-TIMOLOL 0.2-0.5 % OP SOLN
OPHTHALMIC | Status: DC | PRN
  Administered 2019-02-24: 1 [drp] via OPHTHALMIC

## 2019-02-24 MED ORDER — MOXIFLOXACIN HCL 0.5 % OP SOLN
OPHTHALMIC | Status: DC | PRN
  Administered 2019-02-24: 0.2 mL via OPHTHALMIC

## 2019-02-24 MED ORDER — MIDAZOLAM HCL 2 MG/2ML IJ SOLN
INTRAMUSCULAR | Status: DC | PRN
  Administered 2019-02-24: 1 mg via INTRAVENOUS

## 2019-02-24 MED ORDER — OXYCODONE HCL 5 MG/5ML PO SOLN: 5.0000 mg | Freq: Once | ORAL | Status: DC | PRN

## 2019-02-24 MED ORDER — LACTATED RINGERS IV SOLN: INTRAVENOUS | Status: DC

## 2019-02-24 MED ORDER — TETRACAINE HCL 0.5 % OP SOLN
1.0000 [drp] | OPHTHALMIC | Status: DC | PRN
  Administered 2019-02-24 (×3): 1 [drp] via OPHTHALMIC

## 2019-02-24 MED ORDER — NA CHONDROIT SULF-NA HYALURON 40-17 MG/ML IO SOLN
INTRAOCULAR | Status: DC | PRN
  Administered 2019-02-24: 1 mL via INTRAOCULAR

## 2019-02-24 MED ORDER — ARMC OPHTHALMIC DILATING DROPS
1.0000 "application " | OPHTHALMIC | Status: DC | PRN
  Administered 2019-02-24 (×3): 1 via OPHTHALMIC

## 2019-02-24 MED ORDER — FENTANYL CITRATE (PF) 100 MCG/2ML IJ SOLN
INTRAMUSCULAR | Status: DC | PRN
  Administered 2019-02-24: 50 ug via INTRAVENOUS

## 2019-02-24 MED ORDER — OXYCODONE HCL 5 MG PO TABS: 5.0000 mg | ORAL_TABLET | Freq: Once | ORAL | Status: DC | PRN

## 2019-02-24 SURGICAL SUPPLY — 23 items
CANNULA ANT/CHMB 27G (MISCELLANEOUS) ×2 IMPLANT
CANNULA ANT/CHMB 27GA (MISCELLANEOUS) ×4 IMPLANT
GLOVE SURG LX 8.0 MICRO (GLOVE) ×2
GLOVE SURG LX STRL 8.0 MICRO (GLOVE) ×1 IMPLANT
GLOVE SURG TRIUMPH 8.0 PF LTX (GLOVE) ×2 IMPLANT
GOWN STRL REUS W/ TWL LRG LVL3 (GOWN DISPOSABLE) ×2 IMPLANT
GOWN STRL REUS W/TWL LRG LVL3 (GOWN DISPOSABLE) ×2
LENS IOL DIOP 22.0 (Intraocular Lens) ×2 IMPLANT
LENS IOL TECNIS MONO 22.0 (Intraocular Lens) IMPLANT
MARKER SKIN DUAL TIP RULER LAB (MISCELLANEOUS) ×2 IMPLANT
NDL FILTER BLUNT 18X1 1/2 (NEEDLE) ×1 IMPLANT
NDL RETROBULBAR .5 NSTRL (NEEDLE) ×2 IMPLANT
NEEDLE FILTER BLUNT 18X 1/2SAF (NEEDLE) ×1
NEEDLE FILTER BLUNT 18X1 1/2 (NEEDLE) ×1 IMPLANT
PACK EYE AFTER SURG (MISCELLANEOUS) ×2 IMPLANT
PACK OPTHALMIC (MISCELLANEOUS) ×2 IMPLANT
PACK PORFILIO (MISCELLANEOUS) ×2 IMPLANT
SUT ETHILON 10-0 CS-B-6CS-B-6 (SUTURE)
SUTURE EHLN 10-0 CS-B-6CS-B-6 (SUTURE) IMPLANT
SYR 3ML LL SCALE MARK (SYRINGE) ×2 IMPLANT
SYR TB 1ML LUER SLIP (SYRINGE) ×2 IMPLANT
WATER STERILE IRR 250ML POUR (IV SOLUTION) ×2 IMPLANT
WIPE NON LINTING 3.25X3.25 (MISCELLANEOUS) ×2 IMPLANT

## 2019-02-24 NOTE — Transfer of Care (Signed)
Immediate Anesthesia Transfer of Care Note  Patient: Christine Ballard  Procedure(s) Performed: CATARACT EXTRACTION PHACO AND INTRAOCULAR LENS PLACEMENT (IOC)  LEFT (Left Eye)  Patient Location: PACU  Anesthesia Type: MAC  Level of Consciousness: awake, alert  and patient cooperative  Airway and Oxygen Therapy: Patient Spontanous Breathing and Patient connected to supplemental oxygen  Post-op Assessment: Post-op Vital signs reviewed, Patient's Cardiovascular Status Stable, Respiratory Function Stable, Patent Airway and No signs of Nausea or vomiting  Post-op Vital Signs: Reviewed and stable  Complications: No apparent anesthesia complications

## 2019-02-24 NOTE — H&P (Signed)
All labs reviewed. Abnormal studies sent to patients PCP when indicated.  Previous H&P reviewed, patient examined, there are NO CHANGES.  Christine Granillo Porfilio2/23/202111:45 AM

## 2019-02-24 NOTE — Anesthesia Procedure Notes (Signed)
Performed by: Luvina Poirier, CRNA Pre-anesthesia Checklist: Patient identified, Emergency Drugs available, Suction available, Timeout performed and Patient being monitored Patient Re-evaluated:Patient Re-evaluated prior to induction Oxygen Delivery Method: Nasal cannula Placement Confirmation: positive ETCO2       

## 2019-02-24 NOTE — Op Note (Signed)
PREOPERATIVE DIAGNOSIS:  Nuclear sclerotic cataract of the left eye.   POSTOPERATIVE DIAGNOSIS:  Nuclear sclerotic cataract of the left eye.   OPERATIVE PROCEDURE:@   SURGEON:  Birder Robson, MD.   ANESTHESIA:  Anesthesiologist: Fidel Levy, MD CRNA: Cameron Ali, CRNA  1.      Managed anesthesia care. 2.     0.31ml of Shugarcaine was instilled following the paracentesis   COMPLICATIONS:  None.   TECHNIQUE:   Stop and chop   DESCRIPTION OF PROCEDURE:  The patient was examined and consented in the preoperative holding area where the aforementioned topical anesthesia was applied to the left eye and then brought back to the Operating Room where the left eye was prepped and draped in the usual sterile ophthalmic fashion and a lid speculum was placed. A paracentesis was created with the side port blade and the anterior chamber was filled with viscoelastic. A near clear corneal incision was performed with the steel keratome. A continuous curvilinear capsulorrhexis was performed with a cystotome followed by the capsulorrhexis forceps. Hydrodissection and hydrodelineation were carried out with BSS on a blunt cannula. The lens was removed in a stop and chop  technique and the remaining cortical material was removed with the irrigation-aspiration handpiece. The capsular bag was inflated with viscoelastic and the Technis [id number] lens was placed in the capsular bag without complication. The remaining viscoelastic was removed from the eye with the irrigation-aspiration handpiece. The wounds were hydrated. The anterior chamber was flushed with BSS and the eye was inflated to physiologic pressure. 0.31ml Vigamox was placed in the anterior chamber. The wounds were found to be water tight. The eye was dressed with Combigan. The patient was given protective glasses to wear throughout the day and a shield with which to sleep tonight. The patient was also given drops with which to begin a drop regimen today and  will follow-up with me in one day. Implant Name Type Inv. Item Serial No. Manufacturer Lot No. LRB No. Used Action  LENS IOL DIOP 22.0 - [id number] Intraocular Lens LENS IOL DIOP 22.0 [id number]  Left 1 Implanted    Procedure(s) with comments: CATARACT EXTRACTION PHACO AND INTRAOCULAR LENS PLACEMENT (IOC)  LEFT (Left) - CDE 4.86 U/S 0:26.1  Electronically signed: Birder Robson 02/24/2019 12:10 PM

## 2019-02-24 NOTE — Anesthesia Preprocedure Evaluation (Signed)
Anesthesia Evaluation  Patient identified by MRN, date of birth, ID band Patient awake    Reviewed: NPO status   History of Anesthesia Complications Negative for: history of anesthetic complications  Airway Mallampati: II  TM Distance: >3 FB Neck ROM: full    Dental no notable dental hx.    Pulmonary COPD (mild),  COPD inhaler,  Chronic bronchitis without COPD per PFT  PFT: 01/2019 Preserved spirometry and lung volumes with mildly decreased DLCO  Findings are not consistent with COPD, consider atelectasis or pulmonary vascular disease such as PAH. Claudette Stapler, MD;     Pulmonary exam normal        Cardiovascular Exercise Tolerance: Good hypertension, Normal cardiovascular exam  echo: 08/2018: NORMAL LEFT VENTRICULAR SYSTOLIC FUNCTION NORMAL RIGHT VENTRICULAR SYSTOLIC FUNCTION MILD VALVULAR REGURGITATION (See above) NO VALVULAR STENOSIS;   "Occas fluttering in chest - she was told her heart was skipping beats when seen for her eye surgery. We have done echo and ekg and those were normal.  We discused holter monitor but she wishes to hold off and will call if needed." Angelena Form, MD at 02/23/2019       Neuro/Psych  Headaches (ocular), Anxiety Depression vertigo    GI/Hepatic Neg liver ROS, GERD  Controlled,  Endo/Other  Morbid obesity (bmi 29)  Renal/GU negative Renal ROS  negative genitourinary   Musculoskeletal  (+) Arthritis ,   Abdominal   Peds  Hematology negative hematology ROS (+)   Anesthesia Other Findings Covid: NEG.  ANTERIOR CERVICAL DECOMP/DISCECTOMY FUSION : C2-6: 2018   had ECCE three weeks ago.    Reproductive/Obstetrics                             Anesthesia Physical  Anesthesia Plan  ASA: III  Anesthesia Plan: MAC   Post-op Pain Management:    Induction:   PONV Risk Score and Plan: 2 and TIVA and Midazolam  Airway Management  Planned:   Additional Equipment:   Intra-op Plan:   Post-operative Plan:   Informed Consent: I have reviewed the patients History and Physical, chart, labs and discussed the procedure including the risks, benefits and alternatives for the proposed anesthesia with the patient or authorized representative who has indicated his/her understanding and acceptance.       Plan Discussed with: CRNA  Anesthesia Plan Comments:         Anesthesia Quick Evaluation

## 2019-02-24 NOTE — Anesthesia Postprocedure Evaluation (Signed)
Anesthesia Post Note  Patient: Christine Ballard  Procedure(s) Performed: CATARACT EXTRACTION PHACO AND INTRAOCULAR LENS PLACEMENT (IOC)  LEFT (Left Eye)     Patient location during evaluation: PACU Anesthesia Type: MAC Level of consciousness: awake and alert Pain management: pain level controlled Vital Signs Assessment: post-procedure vital signs reviewed and stable Respiratory status: spontaneous breathing, nonlabored ventilation, respiratory function stable and patient connected to nasal cannula oxygen Cardiovascular status: stable and blood pressure returned to baseline Postop Assessment: no apparent nausea or vomiting Anesthetic complications: no    Cuinn Westerhold

## 2019-02-25 ENCOUNTER — Encounter: Payer: Self-pay | Admitting: *Deleted

## 2019-02-27 ENCOUNTER — Ambulatory Visit: Payer: Medicare PPO | Attending: Internal Medicine

## 2019-02-27 DIAGNOSIS — Z23 Encounter for immunization: Secondary | ICD-10-CM | POA: Insufficient documentation

## 2019-02-27 NOTE — Progress Notes (Signed)
   Covid-19 Vaccination Clinic  Name:  Christine Ballard    MRN: [id number] DOB: 03-09-1940  02/27/2019  Christine Ballard was observed post Covid-19 immunization for 15 minutes without incidence. She was provided with Vaccine Information Sheet and instruction to access the V-Safe system.   Christine Ballard was instructed to call 911 with any severe reactions post vaccine: Marland Kitchen Difficulty breathing  . Swelling of your face and throat  . A fast heartbeat  . A bad rash all over your body  . Dizziness and weakness    Immunizations Administered    Name Date Dose VIS Date Route   Pfizer COVID-19 Vaccine 02/27/2019 11:41 AM 0.3 mL 12/12/2018 Intramuscular   Manufacturer: [place]   Lot: [id number]   [place]: [id number]

## 2019-03-24 ENCOUNTER — Ambulatory Visit: Payer: Medicare PPO | Attending: Internal Medicine

## 2019-03-24 DIAGNOSIS — Z23 Encounter for immunization: Secondary | ICD-10-CM

## 2019-03-24 NOTE — Progress Notes (Signed)
   Covid-19 Vaccination Clinic  Name:  Christine Ballard    MRN: [id number] DOB: 08-26-1940  03/24/2019  [name] was observed post Covid-19 immunization for 15 minutes without incident. She was provided with Vaccine Information Sheet and instruction to access the V-Safe system.   Ms. Temple was instructed to call 911 with any severe reactions post vaccine: Marland Kitchen Difficulty breathing  . Swelling of face and throat  . A fast heartbeat  . A bad rash all over body  . Dizziness and weakness   Immunizations Administered    Name Date Dose VIS Date Route   Pfizer COVID-19 Vaccine 03/24/2019  4:35 PM 0.3 mL 12/12/2018 Intramuscular   Manufacturer: [place]   Lot: [id number]   [place]: [id number]

## 2019-05-11 ENCOUNTER — Other Ambulatory Visit: Payer: Self-pay | Admitting: Surgery

## 2019-05-12 ENCOUNTER — Encounter
Admission: RE | Admit: 2019-05-12 | Discharge: 2019-05-12 | Disposition: A | Discharge status: Home / Self Care | Payer: Medicare PPO | Source: Ambulatory Visit | Attending: Surgery | Admitting: Surgery

## 2019-05-12 ENCOUNTER — Other Ambulatory Visit: Payer: Self-pay

## 2019-05-12 DIAGNOSIS — I10 Essential (primary) hypertension: Secondary | ICD-10-CM | POA: Diagnosis not present

## 2019-05-12 DIAGNOSIS — Z01818 Encounter for other preprocedural examination: Secondary | ICD-10-CM | POA: Insufficient documentation

## 2019-05-12 HISTORY — DX: Malignant (primary) neoplasm, unspecified: C80.1

## 2019-05-12 NOTE — Patient Instructions (Signed)
Your procedure is scheduled on: Tues 5/25 Report to Day Surgery. To find out your arrival time please call [phone] between 1PM - 3PM on Mon 5/24 .  Remember: Instructions that are not followed completely may result in serious medical risk,  up to and including death, or upon the discretion of your surgeon and anesthesiologist your  surgery may need to be rescheduled.     _X__ 1. Do not eat food after midnight the night before your procedure.                 No gum chewing or hard candies. You may drink clear liquids up to 2 hours                 before you are scheduled to arrive for your surgery- DO not drink clear                 liquids within 2 hours of the start of your surgery.                 Clear Liquids include:  water, apple juice without pulp, clear Gatorade, G2 or                  Gatorade Zero (avoid Red/Purple/Blue), Black Coffee or Tea (Do not add                 anything to coffee or tea). __x___2.   Complete the carbohydrate drink provided to you, 2 hours before arrival.  __X__2.  On the morning of surgery brush your teeth with toothpaste and water, you                may rinse your mouth with mouthwash if you wish.  Do not swallow any toothpaste of mouthwash.     ___ 3.  No Alcohol for 24 hours before or after surgery.   ___ 4.  Do Not Smoke or use e-cigarettes For 24 Hours Prior to Your Surgery.                 Do not use any chewable tobacco products for at least 6 hours prior to                 Surgery.  _X__  5.  Do not use any recreational drugs (marijuana, cocaine, heroin, ecstacy, MDMA or other)                For at least one week prior to your surgery.  Combination of these drugs with anesthesia                May have life threatening results.  ____  6.  Bring all medications with you on the day of surgery if instructed.   _x___  7.  Notify your doctor if there is any change in your medical condition      (cold, fever,  infections).     Do not wear jewelry, make-up, hairpins, clips or nail polish. Do not wear lotions, powders, or perfumes. You may wear deodorant. Do not shave 48 hours prior to surgery.  Do not bring valuables to the hospital.    [facility] is not responsible for any belongings or valuables.  Contacts, dentures or bridgework may not be worn into surgery. Leave your suitcase in the car. After surgery it may be brought to your room. For patients admitted to the hospital, discharge time is determined by your treatment team.   Patients  discharged the day of surgery will not be allowed to drive home.   Make arrangements for someone to be with you for the first 24 hours of your Same Day Discharge.    Please read over the following fact sheets that you were given:    __x__ Take these medicines the morning of surgery with A SIP OF WATER:    1. acetaminophen (TYLENOL) 650 MG CR tablet if needed  2. amLODipine (NORVASC) 5 MG tablet  3. cetirizine (ZYRTEC) 10 MG tablet if needed  4.DULoxetine (CYMBALTA) 60 MG capsule  5.fluticasone (FLONASE) 50 MCG/ACT nasal spray if needed  6.meclizine (ANTIVERT) 12.5 MG tablet if needed              7.omeprazole (PRILOSEC) 40 MG capsule  ____ Fleet Enema (as directed)   __x__ Use CHG Soap (or wipes) as directed  ____ Use Benzoyl Peroxide Gel as instructed  __x__ Use inhalers on the day of surgery SYMBICORT 160-4.5 MCG/ACT inhaler  ____ Stop metformin 2 days prior to surgery    ____ Take 1/2 of usual insulin dose the night before surgery. No insulin the morning          of surgery.   ____ Stop Coumadin/Plavix/aspirin on   _x___ Stopped meloxicam (MOBIC) 15 MG tablet already No ibuprofen or aleve after 5/18   ____ Stop supplements until after surgery.    ____ Bring C-Pap to the hospital.

## 2019-05-15 ENCOUNTER — Encounter
Admission: RE | Admit: 2019-05-15 | Discharge: 2019-05-15 | Disposition: A | Discharge status: Home / Self Care | Payer: Medicare PPO | Source: Ambulatory Visit | Attending: Surgery | Admitting: Surgery

## 2019-05-15 ENCOUNTER — Other Ambulatory Visit: Payer: Self-pay

## 2019-05-15 DIAGNOSIS — Z01818 Encounter for other preprocedural examination: Secondary | ICD-10-CM | POA: Diagnosis not present

## 2019-05-15 DIAGNOSIS — I10 Essential (primary) hypertension: Secondary | ICD-10-CM

## 2019-05-15 LAB — TYPE AND SCREEN
ABO/RH(D): O POS
Antibody Screen: NEGATIVE

## 2019-05-15 LAB — URINALYSIS, ROUTINE W REFLEX MICROSCOPIC
Bacteria, UA: NONE SEEN
Glucose, UA: NEGATIVE mg/dL
Hgb urine dipstick: NEGATIVE
Ketones, ur: 5 mg/dL — AB
Nitrite: NEGATIVE
Protein, ur: 30 mg/dL — AB
Specific Gravity, Urine: 1.032 — ABNORMAL HIGH (ref 1.005–1.030)
pH: 5 (ref 5.0–8.0)

## 2019-05-15 LAB — CBC
HCT: 38.2 % (ref 36.0–46.0)
Hemoglobin: 12.6 g/dL (ref 12.0–15.0)
MCH: 28.2 pg (ref 26.0–34.0)
MCHC: 33 g/dL (ref 30.0–36.0)
MCV: 85.5 fL (ref 80.0–100.0)
Platelets: 289 10*3/uL (ref 150–400)
RBC: 4.47 MIL/uL (ref 3.87–5.11)
RDW: 14.4 % (ref 11.5–15.5)
WBC: 7.7 10*3/uL (ref 4.0–10.5)
nRBC: 0 % (ref 0.0–0.2)

## 2019-05-15 LAB — DIFFERENTIAL
Abs Immature Granulocytes: 0.03 10*3/uL (ref 0.00–0.07)
Basophils Absolute: 0 10*3/uL (ref 0.0–0.1)
Basophils Relative: 1 %
Eosinophils Absolute: 0.1 10*3/uL (ref 0.0–0.5)
Eosinophils Relative: 1 %
Immature Granulocytes: 0 %
Lymphocytes Relative: 21 %
Lymphs Abs: 1.6 10*3/uL (ref 0.7–4.0)
Monocytes Absolute: 0.7 10*3/uL (ref 0.1–1.0)
Monocytes Relative: 10 %
Neutro Abs: 5.2 10*3/uL (ref 1.7–7.7)
Neutrophils Relative %: 67 %

## 2019-05-15 LAB — SURGICAL PCR SCREEN
MRSA, PCR: NEGATIVE
Staphylococcus aureus: POSITIVE — AB

## 2019-05-20 ENCOUNTER — Other Ambulatory Visit: Admission: RE | Admit: 2019-05-20 | Payer: Medicare PPO | Source: Ambulatory Visit

## 2019-05-22 ENCOUNTER — Other Ambulatory Visit
Admission: RE | Admit: 2019-05-22 | Discharge: 2019-05-22 | Disposition: A | Discharge status: Home / Self Care | Payer: Medicare PPO | Source: Ambulatory Visit | Attending: Surgery | Admitting: Surgery

## 2019-05-22 DIAGNOSIS — Z20822 Contact with and (suspected) exposure to covid-19: Secondary | ICD-10-CM | POA: Insufficient documentation

## 2019-05-22 DIAGNOSIS — Z01812 Encounter for preprocedural laboratory examination: Secondary | ICD-10-CM | POA: Diagnosis present

## 2019-05-22 LAB — SARS CORONAVIRUS 2 (TAT 6-24 HRS): SARS Coronavirus 2: NEGATIVE

## 2019-05-22 LAB — COMPREHENSIVE METABOLIC PANEL
ALT: 15 U/L (ref 0–44)
AST: 17 U/L (ref 15–41)
Albumin: 4.4 g/dL (ref 3.5–5.0)
Alkaline Phosphatase: 71 U/L (ref 38–126)
Anion gap: 9 (ref 5–15)
BUN: 18 mg/dL (ref 8–23)
CO2: 27 mmol/L (ref 22–32)
Calcium: 9.2 mg/dL (ref 8.9–10.3)
Chloride: 104 mmol/L (ref 98–111)
Creatinine, Ser: 0.88 mg/dL (ref 0.44–1.00)
GFR calc Af Amer: >60 mL/min (ref >60)
GFR calc non Af Amer: >60 mL/min (ref >60)
Glucose, Bld: 108 mg/dL — ABNORMAL HIGH (ref 70–99)
Potassium: 3.8 mmol/L (ref 3.5–5.1)
Sodium: 140 mmol/L (ref 135–145)
Total Bilirubin: 0.9 mg/dL (ref 0.3–1.2)
Total Protein: 7.6 g/dL (ref 6.5–8.1)

## 2019-05-25 MED ORDER — CLINDAMYCIN PHOSPHATE 900 MG/50ML IV SOLN
900.0000 mg | INTRAVENOUS | Status: AC
  Administered 2019-05-26: 900 mg via INTRAVENOUS

## 2019-05-26 ENCOUNTER — Ambulatory Visit: Payer: Medicare PPO | Admitting: Anesthesiology

## 2019-05-26 ENCOUNTER — Other Ambulatory Visit: Payer: Self-pay

## 2019-05-26 ENCOUNTER — Observation Stay
Admission: RE | Admit: 2019-05-26 | Discharge: 2019-05-27 | Disposition: A | Discharge status: Home / Self Care | Payer: Medicare PPO | Attending: Surgery | Admitting: Surgery

## 2019-05-26 ENCOUNTER — Observation Stay: Payer: Medicare PPO

## 2019-05-26 ENCOUNTER — Encounter: Payer: Self-pay | Admitting: Surgery

## 2019-05-26 ENCOUNTER — Encounter
Admission: RE | Disposition: A | Discharge status: Home / Self Care | Payer: Self-pay | Source: Home / Self Care | Attending: Surgery

## 2019-05-26 DIAGNOSIS — I129 Hypertensive chronic kidney disease with stage 1 through stage 4 chronic kidney disease, or unspecified chronic kidney disease: Secondary | ICD-10-CM | POA: Diagnosis not present

## 2019-05-26 DIAGNOSIS — Z96651 Presence of right artificial knee joint: Secondary | ICD-10-CM | POA: Diagnosis present

## 2019-05-26 DIAGNOSIS — F419 Anxiety disorder, unspecified: Secondary | ICD-10-CM | POA: Insufficient documentation

## 2019-05-26 DIAGNOSIS — Z7901 Long term (current) use of anticoagulants: Secondary | ICD-10-CM | POA: Insufficient documentation

## 2019-05-26 DIAGNOSIS — J449 Chronic obstructive pulmonary disease, unspecified: Secondary | ICD-10-CM | POA: Insufficient documentation

## 2019-05-26 DIAGNOSIS — Z882 Allergy status to sulfonamides status: Secondary | ICD-10-CM | POA: Insufficient documentation

## 2019-05-26 DIAGNOSIS — M1711 Unilateral primary osteoarthritis, right knee: Principal | ICD-10-CM | POA: Insufficient documentation

## 2019-05-26 DIAGNOSIS — Z880 Allergy status to penicillin: Secondary | ICD-10-CM | POA: Insufficient documentation

## 2019-05-26 DIAGNOSIS — Z79899 Other long term (current) drug therapy: Secondary | ICD-10-CM | POA: Diagnosis not present

## 2019-05-26 DIAGNOSIS — Z7982 Long term (current) use of aspirin: Secondary | ICD-10-CM | POA: Diagnosis not present

## 2019-05-26 DIAGNOSIS — F329 Major depressive disorder, single episode, unspecified: Secondary | ICD-10-CM | POA: Insufficient documentation

## 2019-05-26 DIAGNOSIS — N189 Chronic kidney disease, unspecified: Secondary | ICD-10-CM | POA: Insufficient documentation

## 2019-05-26 DIAGNOSIS — Z881 Allergy status to other antibiotic agents status: Secondary | ICD-10-CM | POA: Insufficient documentation

## 2019-05-26 HISTORY — PX: PARTIAL KNEE ARTHROPLASTY: SHX2174

## 2019-05-26 LAB — COMPREHENSIVE METABOLIC PANEL
ALT: 16 U/L (ref 0–44)
AST: 16 U/L (ref 15–41)
Albumin: 3.9 g/dL (ref 3.5–5.0)
Alkaline Phosphatase: 69 U/L (ref 38–126)
Anion gap: 9 (ref 5–15)
BUN: 13 mg/dL (ref 8–23)
CO2: 26 mmol/L (ref 22–32)
Calcium: 9.1 mg/dL (ref 8.9–10.3)
Chloride: 107 mmol/L (ref 98–111)
Creatinine, Ser: 0.79 mg/dL (ref 0.44–1.00)
GFR calc Af Amer: >60 mL/min (ref >60)
GFR calc non Af Amer: >60 mL/min (ref >60)
Glucose, Bld: 109 mg/dL — ABNORMAL HIGH (ref 70–99)
Potassium: 4.2 mmol/L (ref 3.5–5.1)
Sodium: 142 mmol/L (ref 135–145)
Total Bilirubin: 0.7 mg/dL (ref 0.3–1.2)
Total Protein: 7 g/dL (ref 6.5–8.1)

## 2019-05-26 SURGERY — ARTHROPLASTY, KNEE, UNICOMPARTMENTAL
Anesthesia: Spinal | Site: Knee | Laterality: Right

## 2019-05-26 MED ORDER — HYDROMORPHONE HCL 1 MG/ML IJ SOLN: 0.2500 mg | INTRAMUSCULAR | Status: DC | PRN

## 2019-05-26 MED ORDER — ENOXAPARIN SODIUM 40 MG/0.4ML ~~LOC~~ SOLN: 40.0000 mg | SUBCUTANEOUS | 0 refills | Status: DC

## 2019-05-26 MED ORDER — TRANEXAMIC ACID 1000 MG/10ML IV SOLN
INTRAVENOUS | Status: DC | PRN
  Administered 2019-05-26: 1000 mg via TOPICAL

## 2019-05-26 MED ORDER — EPINEPHRINE PF 1 MG/ML IJ SOLN
INTRAMUSCULAR | Status: AC
  Filled 2019-05-26: qty 1

## 2019-05-26 MED ORDER — MAGNESIUM HYDROXIDE 400 MG/5ML PO SUSP
30.0000 mL | Freq: Every day | ORAL | Status: DC | PRN
  Administered 2019-05-27: 30 mL via ORAL
  Filled 2019-05-26: qty 30

## 2019-05-26 MED ORDER — FENTANYL CITRATE (PF) 100 MCG/2ML IJ SOLN
INTRAMUSCULAR | Status: DC | PRN
  Administered 2019-05-26: 50 ug via INTRAVENOUS

## 2019-05-26 MED ORDER — BUPIVACAINE HCL (PF) 0.5 % IJ SOLN
INTRAMUSCULAR | Status: DC | PRN
  Administered 2019-05-26: 2.5 mL

## 2019-05-26 MED ORDER — OXYCODONE HCL 5 MG PO TABS: 5.0000 mg | ORAL_TABLET | ORAL | 0 refills | Status: DC | PRN

## 2019-05-26 MED ORDER — FLUTICASONE PROPIONATE 50 MCG/ACT NA SUSP
1.0000 | Freq: Every day | NASAL | Status: DC | PRN
  Filled 2019-05-26: qty 16

## 2019-05-26 MED ORDER — DULOXETINE HCL 60 MG PO CPEP
60.0000 mg | ORAL_CAPSULE | Freq: Every day | ORAL | Status: DC
  Administered 2019-05-26: 60 mg via ORAL
  Filled 2019-05-26 (×2): qty 1

## 2019-05-26 MED ORDER — BUPIVACAINE LIPOSOME 1.3 % IJ SUSP
INTRAMUSCULAR | Status: AC
  Filled 2019-05-26: qty 20

## 2019-05-26 MED ORDER — ONDANSETRON HCL 4 MG PO TABS: 4.0000 mg | ORAL_TABLET | Freq: Four times a day (QID) | ORAL | Status: DC | PRN

## 2019-05-26 MED ORDER — DIPHENHYDRAMINE HCL 12.5 MG/5ML PO ELIX: 12.5000 mg | ORAL_SOLUTION | ORAL | Status: DC | PRN

## 2019-05-26 MED ORDER — ENOXAPARIN SODIUM 40 MG/0.4ML ~~LOC~~ SOLN
40.0000 mg | SUBCUTANEOUS | Status: DC
  Administered 2019-05-27: 40 mg via SUBCUTANEOUS
  Filled 2019-05-26: qty 0.4

## 2019-05-26 MED ORDER — PANTOPRAZOLE SODIUM 40 MG PO TBEC
40.0000 mg | DELAYED_RELEASE_TABLET | Freq: Every day | ORAL | Status: DC
  Administered 2019-05-27: 40 mg via ORAL
  Filled 2019-05-26: qty 1

## 2019-05-26 MED ORDER — VITAMIN D 25 MCG (1000 UNIT) PO TABS
1000.0000 [IU] | ORAL_TABLET | Freq: Every day | ORAL | Status: DC
  Administered 2019-05-26 – 2019-05-27 (×2): 1000 [IU] via ORAL
  Filled 2019-05-26 (×2): qty 1

## 2019-05-26 MED ORDER — PROPOFOL 500 MG/50ML IV EMUL
INTRAVENOUS | Status: DC | PRN
  Administered 2019-05-26: 100 ug/kg/min via INTRAVENOUS

## 2019-05-26 MED ORDER — ONDANSETRON HCL 4 MG/2ML IJ SOLN: 4.0000 mg | Freq: Once | INTRAMUSCULAR | Status: DC | PRN

## 2019-05-26 MED ORDER — FENTANYL CITRATE (PF) 100 MCG/2ML IJ SOLN: 25.0000 ug | INTRAMUSCULAR | Status: DC | PRN

## 2019-05-26 MED ORDER — ACETAMINOPHEN 325 MG PO TABS: 325.0000 mg | ORAL_TABLET | Freq: Four times a day (QID) | ORAL | Status: DC | PRN

## 2019-05-26 MED ORDER — BUPIVACAINE HCL (PF) 0.5 % IJ SOLN
INTRAMUSCULAR | Status: AC
  Filled 2019-05-26: qty 30

## 2019-05-26 MED ORDER — KETOROLAC TROMETHAMINE 15 MG/ML IJ SOLN
INTRAMUSCULAR | Status: AC
  Filled 2019-05-26: qty 1

## 2019-05-26 MED ORDER — KETOROLAC TROMETHAMINE 15 MG/ML IJ SOLN
7.5000 mg | Freq: Four times a day (QID) | INTRAMUSCULAR | Status: AC
  Administered 2019-05-26 – 2019-05-27 (×4): 7.5 mg via INTRAVENOUS
  Filled 2019-05-26 (×4): qty 1

## 2019-05-26 MED ORDER — ACETAMINOPHEN 10 MG/ML IV SOLN
INTRAVENOUS | Status: AC
  Filled 2019-05-26: qty 100

## 2019-05-26 MED ORDER — METOCLOPRAMIDE HCL 5 MG/ML IJ SOLN: 5.0000 mg | Freq: Three times a day (TID) | INTRAMUSCULAR | Status: DC | PRN

## 2019-05-26 MED ORDER — LACTATED RINGERS IV SOLN: INTRAVENOUS | Status: DC

## 2019-05-26 MED ORDER — PHENYLEPHRINE HCL (PRESSORS) 10 MG/ML IV SOLN
INTRAVENOUS | Status: DC | PRN
  Administered 2019-05-26: 100 ug via INTRAVENOUS

## 2019-05-26 MED ORDER — MOMETASONE FURO-FORMOTEROL FUM 200-5 MCG/ACT IN AERO
2.0000 | INHALATION_SPRAY | Freq: Two times a day (BID) | RESPIRATORY_TRACT | Status: DC
  Administered 2019-05-26 – 2019-05-27 (×2): 2 via RESPIRATORY_TRACT
  Filled 2019-05-26: qty 8.8

## 2019-05-26 MED ORDER — ONDANSETRON HCL 4 MG/2ML IJ SOLN: 4.0000 mg | Freq: Four times a day (QID) | INTRAMUSCULAR | Status: DC | PRN

## 2019-05-26 MED ORDER — MECLIZINE HCL 12.5 MG PO TABS
12.5000 mg | ORAL_TABLET | Freq: Three times a day (TID) | ORAL | Status: DC | PRN
  Filled 2019-05-26: qty 1

## 2019-05-26 MED ORDER — OXYCODONE HCL 5 MG PO TABS
5.0000 mg | ORAL_TABLET | ORAL | Status: DC | PRN
  Administered 2019-05-27: 10 mg via ORAL
  Filled 2019-05-26: qty 2

## 2019-05-26 MED ORDER — ACETAMINOPHEN 10 MG/ML IV SOLN
INTRAVENOUS | Status: DC | PRN
  Administered 2019-05-26: 1000 mg via INTRAVENOUS

## 2019-05-26 MED ORDER — ONDANSETRON HCL 4 MG/2ML IJ SOLN
INTRAMUSCULAR | Status: DC | PRN
  Administered 2019-05-26: 4 mg via INTRAVENOUS

## 2019-05-26 MED ORDER — CLINDAMYCIN PHOSPHATE 600 MG/50ML IV SOLN
600.0000 mg | Freq: Four times a day (QID) | INTRAVENOUS | Status: AC
  Administered 2019-05-26 – 2019-05-27 (×3): 600 mg via INTRAVENOUS
  Filled 2019-05-26 (×3): qty 50

## 2019-05-26 MED ORDER — METOCLOPRAMIDE HCL 10 MG PO TABS: 5.0000 mg | ORAL_TABLET | Freq: Three times a day (TID) | ORAL | Status: DC | PRN

## 2019-05-26 MED ORDER — FLEET ENEMA 7-19 GM/118ML RE ENEM: 1.0000 | ENEMA | Freq: Once | RECTAL | Status: DC | PRN

## 2019-05-26 MED ORDER — TRAMADOL HCL 50 MG PO TABS
50.0000 mg | ORAL_TABLET | Freq: Four times a day (QID) | ORAL | Status: DC
  Administered 2019-05-26 – 2019-05-27 (×4): 50 mg via ORAL
  Filled 2019-05-26 (×4): qty 1

## 2019-05-26 MED ORDER — PROPOFOL 500 MG/50ML IV EMUL
INTRAVENOUS | Status: AC
  Filled 2019-05-26: qty 50

## 2019-05-26 MED ORDER — DOCUSATE SODIUM 100 MG PO CAPS
100.0000 mg | ORAL_CAPSULE | Freq: Two times a day (BID) | ORAL | Status: DC
  Administered 2019-05-26 – 2019-05-27 (×2): 100 mg via ORAL
  Filled 2019-05-26 (×2): qty 1

## 2019-05-26 MED ORDER — PSYLLIUM 95 % PO PACK
1.0000 | PACK | Freq: Every day | ORAL | Status: DC | PRN
  Filled 2019-05-26: qty 1

## 2019-05-26 MED ORDER — SODIUM CHLORIDE 0.9 % IV SOLN: INTRAVENOUS | Status: DC

## 2019-05-26 MED ORDER — BUPIVACAINE-EPINEPHRINE (PF) 0.5% -1:200000 IJ SOLN
INTRAMUSCULAR | Status: DC | PRN
  Administered 2019-05-26: 30 mL

## 2019-05-26 MED ORDER — SODIUM CHLORIDE FLUSH 0.9 % IV SOLN
INTRAVENOUS | Status: AC
  Filled 2019-05-26: qty 20

## 2019-05-26 MED ORDER — SODIUM CHLORIDE 0.9 % IV SOLN
INTRAVENOUS | Status: DC | PRN
  Administered 2019-05-26: 40 mL

## 2019-05-26 MED ORDER — BUPIVACAINE HCL (PF) 0.5 % IJ SOLN
INTRAMUSCULAR | Status: AC
  Filled 2019-05-26: qty 10

## 2019-05-26 MED ORDER — TRANEXAMIC ACID 1000 MG/10ML IV SOLN
INTRAVENOUS | Status: AC
  Filled 2019-05-26: qty 10

## 2019-05-26 MED ORDER — MIDAZOLAM HCL 2 MG/2ML IJ SOLN
INTRAMUSCULAR | Status: AC
  Filled 2019-05-26: qty 2

## 2019-05-26 MED ORDER — POLYVINYL ALCOHOL 1.4 % OP SOLN
Freq: Four times a day (QID) | OPHTHALMIC | Status: DC | PRN
  Filled 2019-05-26: qty 15

## 2019-05-26 MED ORDER — CLINDAMYCIN PHOSPHATE 900 MG/50ML IV SOLN
INTRAVENOUS | Status: AC
  Filled 2019-05-26: qty 50

## 2019-05-26 MED ORDER — AMLODIPINE BESYLATE 5 MG PO TABS
7.5000 mg | ORAL_TABLET | Freq: Every day | ORAL | Status: DC
  Filled 2019-05-26: qty 2

## 2019-05-26 MED ORDER — SEVOFLURANE IN SOLN
RESPIRATORY_TRACT | Status: AC
  Filled 2019-05-26: qty 250

## 2019-05-26 MED ORDER — FENTANYL CITRATE (PF) 100 MCG/2ML IJ SOLN
INTRAMUSCULAR | Status: AC
  Filled 2019-05-26: qty 2

## 2019-05-26 MED ORDER — ACETAMINOPHEN 500 MG PO TABS
1000.0000 mg | ORAL_TABLET | Freq: Four times a day (QID) | ORAL | Status: AC
  Administered 2019-05-26 – 2019-05-27 (×4): 1000 mg via ORAL
  Filled 2019-05-26 (×4): qty 2

## 2019-05-26 MED ORDER — LORATADINE 10 MG PO TABS
10.0000 mg | ORAL_TABLET | Freq: Every day | ORAL | Status: DC
  Administered 2019-05-26 – 2019-05-27 (×2): 10 mg via ORAL
  Filled 2019-05-26 (×2): qty 1

## 2019-05-26 MED ORDER — KETOROLAC TROMETHAMINE 15 MG/ML IJ SOLN
15.0000 mg | Freq: Once | INTRAMUSCULAR | Status: AC
  Administered 2019-05-26: 15 mg via INTRAVENOUS

## 2019-05-26 SURGICAL SUPPLY — 65 items
BNDG ELASTIC 6X5.8 VLCR STR LF (GAUZE/BANDAGES/DRESSINGS) ×3 IMPLANT
CANISTER SUCT 1200ML W/VALVE (MISCELLANEOUS) ×3 IMPLANT
CANISTER SUCT 3000ML PPV (MISCELLANEOUS) ×3 IMPLANT
CEMENT BONE R 1X40 (Cement) ×3 IMPLANT
CEMENT VACUUM MIXING SYSTEM (MISCELLANEOUS) ×3 IMPLANT
CHLORAPREP W/TINT 26 (MISCELLANEOUS) ×6 IMPLANT
COOLER POLAR GLACIER W/PUMP (MISCELLANEOUS) ×3 IMPLANT
COVER MAYO STAND REUSABLE (DRAPES) ×3 IMPLANT
COVER WAND RF STERILE (DRAPES) ×3 IMPLANT
CUFF TOURN SGL QUICK 24 (TOURNIQUET CUFF) ×2
CUFF TOURN SGL QUICK 30 (TOURNIQUET CUFF)
CUFF TRNQT CYL 24X4X16.5-23 (TOURNIQUET CUFF) IMPLANT
CUFF TRNQT CYL 30X4X21-28X (TOURNIQUET CUFF) IMPLANT
DRAPE C-ARM XRAY 36X54 (DRAPES) IMPLANT
DRSG OPSITE POSTOP 4X12 (GAUZE/BANDAGES/DRESSINGS) ×3 IMPLANT
DRSG OPSITE POSTOP 4X14 (GAUZE/BANDAGES/DRESSINGS) ×1 IMPLANT
DRSG OPSITE POSTOP 4X6 (GAUZE/BANDAGES/DRESSINGS) ×1 IMPLANT
ELECT CAUTERY BLADE 6.4 (BLADE) ×3 IMPLANT
ELECT REM PT RETURN 9FT ADLT (ELECTROSURGICAL) ×3
ELECTRODE REM PT RTRN 9FT ADLT (ELECTROSURGICAL) ×1 IMPLANT
GAUZE 4X4 16PLY RFD (DISPOSABLE) ×1 IMPLANT
GAUZE SPONGE 4X4 12PLY STRL (GAUZE/BANDAGES/DRESSINGS) ×3 IMPLANT
GAUZE XEROFORM 1X8 LF (GAUZE/BANDAGES/DRESSINGS) ×3 IMPLANT
GLOVE BIO SURGEON STRL SZ7.5 (GLOVE) ×12 IMPLANT
GLOVE BIO SURGEON STRL SZ8 (GLOVE) ×12 IMPLANT
GLOVE BIOGEL PI IND STRL 8 (GLOVE) ×1 IMPLANT
GLOVE BIOGEL PI INDICATOR 8 (GLOVE) ×2
GLOVE INDICATOR 8.0 STRL GRN (GLOVE) ×3 IMPLANT
GOWN STRL REUS W/ TWL LRG LVL3 (GOWN DISPOSABLE) ×1 IMPLANT
GOWN STRL REUS W/ TWL XL LVL3 (GOWN DISPOSABLE) ×1 IMPLANT
GOWN STRL REUS W/TWL LRG LVL3 (GOWN DISPOSABLE) ×2
GOWN STRL REUS W/TWL XL LVL3 (GOWN DISPOSABLE) ×2
HOLDER FOLEY CATH W/STRAP (MISCELLANEOUS) ×1 IMPLANT
HOOD PEEL AWAY FLYTE STAYCOOL (MISCELLANEOUS) ×9 IMPLANT
KIT TURNOVER KIT A (KITS) ×3 IMPLANT
KNEE PARTIAL CEMENT FEM XS (Miscellaneous) ×2 IMPLANT
KNEE UNICOMP THICK PH3 6MM (Knees) ×2 IMPLANT
MAT ABSORB  FLUID 56X50 GRAY (MISCELLANEOUS) ×2
MAT ABSORB FLUID 56X50 GRAY (MISCELLANEOUS) ×1 IMPLANT
NDL SAFETY ECLIPSE 18X1.5 (NEEDLE) ×1 IMPLANT
NDL SPNL 20GX3.5 QUINCKE YW (NEEDLE) ×1 IMPLANT
NEEDLE HYPO 18GX1.5 SHARP (NEEDLE) ×2
NEEDLE SPNL 20GX3.5 QUINCKE YW (NEEDLE) ×3 IMPLANT
NS IRRIG 1000ML POUR BTL (IV SOLUTION) ×3 IMPLANT
PACK BLADE SAW RECIP 70 3 PT (BLADE) ×2 IMPLANT
PACK TOTAL KNEE (MISCELLANEOUS) ×3 IMPLANT
PAD WRAPON POLAR KNEE (MISCELLANEOUS) ×1 IMPLANT
PULSAVAC PLUS IRRIG FAN TIP (DISPOSABLE) ×3
SOL .9 NS 3000ML IRR  AL (IV SOLUTION) ×2
SOL .9 NS 3000ML IRR UROMATIC (IV SOLUTION) ×1 IMPLANT
STAPLER SKIN PROX 35W (STAPLE) ×3 IMPLANT
STRAP SAFETY 5IN WIDE (MISCELLANEOUS) ×3 IMPLANT
SUCTION FRAZIER HANDLE 10FR (MISCELLANEOUS) ×2
SUCTION TUBE FRAZIER 10FR DISP (MISCELLANEOUS) ×1 IMPLANT
SUT VIC AB 0 CT1 36 (SUTURE) ×3 IMPLANT
SUT VIC AB 2-0 CT1 27 (SUTURE) ×8
SUT VIC AB 2-0 CT1 TAPERPNT 27 (SUTURE) ×4 IMPLANT
SYR 10ML LL (SYRINGE) ×3 IMPLANT
SYR 20ML LL LF (SYRINGE) ×3 IMPLANT
SYR 30ML LL (SYRINGE) ×9 IMPLANT
TAPE TRANSPORE STRL 2 31045 (GAUZE/BANDAGES/DRESSINGS) ×1 IMPLANT
TIP FAN IRRIG PULSAVAC PLUS (DISPOSABLE) ×1 IMPLANT
TRAY FOLEY MTR SLVR 16FR STAT (SET/KITS/TRAYS/PACK) ×1 IMPLANT
TRAY TIBIAL KNEE OXFORD SZAA (Joint) ×2 IMPLANT
WRAPON POLAR PAD KNEE (MISCELLANEOUS) ×3

## 2019-05-26 NOTE — H&P (Signed)
History of Present Illness: Christine Ballard is a 79 y.o.female who is being seen in consultation at the request of Dr. Posey Pronto for right knee pain. The symptoms began in August, 2019 and developed after she felt a pop in her knee, resulting in severe medial sided right knee pain. She saw Dr. Posey Pronto who ordered an MRI scan which confirmed the presence of a medial meniscus root tear, but also demonstrated significant degenerative changes of the medial compartment, precluding arthroscopic surgical repair of the root tear. The patient has been treated with serial steroid injections which have provided temporary partial relief of her symptoms. However, recently, the symptoms have worsened and the shots of become less effective, prompting Dr. Posey Pronto to refer the patient to me for further evaluation and treatment. She reports no pain at rest, but that with ambulation, her pain can get as high as 6-7/10. The pain is located along the medial aspect of the knee. The pain is described as aching, stabbing and throbbing. The symptoms are aggravated using stairs, rising from a chair, walking, standing and standing pivot. She also describes no mechanical symptoms. She has associated mild swelling and deformity. She has tried acetaminophen, over-the-counter medications, anti-inflammatories and steroid injections with temporary partial relief.  Current Outpatient Medications: . acetaminophen (TYLENOL ARTHRITIS PAIN) 650 MG ER tablet Take 650 mg by mouth every 8 (eight) hours as needed for Pain.  Marland Kitchen amLODIPine (NORVASC) 5 MG tablet Take 1.5 tablets (7.5 mg total) by mouth once daily 135 tablet 1  . aspirin 81 MG EC tablet Take 1 tablet (81 mg total) by mouth once daily 30 tablet 11  . budesonide-formoteroL (SYMBICORT) 160-4.5 mcg/actuation inhaler Inhale 2 inhalations into the lungs 2 (two) times daily 1 Inhaler 10  . calcium-vitamin D3-vitamin K [id number] mg-unit-mcg Chew Take by mouth once daily.   . cetirizine (ZYRTEC) 10 MG  tablet Take 10-20 mg by mouth once daily as needed.   . cholecalciferol (CHOLECALCIFEROL) 1,000 unit tablet Take 1,000 Units by mouth once daily.  . diclofenac (VOLTAREN) 1 % topical gel Apply 2 g topically 4 (four) times daily  . docusate (COLACE) 100 MG capsule Take 100 mg by mouth 2 (two) times daily as needed for Constipation.  . DULoxetine (CYMBALTA) 60 MG DR capsule Take 1 capsule (60 mg total) by mouth once daily 30 capsule 11  . fluticasone (FLONASE) 50 mcg/actuation nasal spray Place 2 sprays into both nostrils once daily as needed. 16 g 3  . HYDROcodone-acetaminophen (NORCO) 5-325 mg tablet TK 1 T PO Q 6 H PRF MODERATE PAIN  . meclizine (ANTIVERT) 12.5 mg tablet Take 1-2 tablets 3 times a day as needed. 30 tablet 1  . meloxicam (MOBIC) 15 MG tablet TAKE 1 TABLET(15 MG) BY MOUTH EVERY DAY (Patient taking differently: Take 7.5 mg by mouth once daily ) 90 tablet 0  . methyl salicylate-menthol [id number] % Crea Apply topically  . omeprazole (PRILOSEC) 40 MG DR capsule Take 1 capsule (40 mg total) by mouth once daily 30 capsule 11  . polyethylene glycol (MIRALAX) packet Take 17 g by mouth as needed for Constipation. Mix in 4-8ounces of fluid prior to taking.  . prednisolon/gatiflox/bromfenac (PREDNISOL ACE-GATIFLOX-BROMFEN) 1-0.5-0.075 % DrpS Apply to eye QID for one week, then BID   No current Epic-ordered facility-administered medications on file.   Allergies  . Bisacodyl Other (See Comments)  Extreme cramping.  . Ciprofloxacin Muscle Pain  . Penicillins Unknown  . Sulfa (Sulfonamide Antibiotics) Unknown   Past Medical History:  .  Allergy Seasonal  . Anxiety Occasionally  . Asthma without status asthmaticus, unspecified Allergy induced, seasonal  . Cancer (CMS-HCC) Skin cancer removed from back  . COPD (chronic obstructive pulmonary disease) (CMS-HCC) Seasonal  . De Quervain's tenosynovitis  . Emphysema of lung (CMS-HCC)  . Encounter for blood transfusion 50+ years ago  . GERD  (gastroesophageal reflux disease) Occasionally  . Hypertension  . Migraines  . Mild renal insufficiency  . Mixed anxiety and depressive disorder  . OA (osteoarthritis)  . Osteopenia  on Fosamax for several years, stopped 2012  . Painless hematuria   Past Surgical History:  . CERVICAL DISCECTOMY AND FUSION 2018  Dr. Cyndy Freeze  . COLONOSCOPY 10/16/2016  Hyperplastic colon polyp/Repeat 21yrs if desired/MUS  . ENDOSCOPIC CARPAL TUNNEL RELEASE  Bilateral  . HYSTERECTOMY  and oophorectomy  . TONSILLECTOMY 50 years ago   Family History  . Myocardial Infarction (Heart attack) Father  . Heart disease Father  . High blood pressure (Hypertension) Father  . Stroke Father  . Coronary Artery Disease (Blocked arteries around heart) Father  All of his brothers had this  . High blood pressure (Hypertension) Mother  . Osteoporosis (Thinning of bones) Mother  . Alzheimer's disease Mother  . Glaucoma Mother  . Hip fracture Mother  . Osteoarthritis Mother  . Alcohol abuse Paternal Uncle  . Alcohol abuse Paternal Uncle  . Bipolar disorder Sister  . Diabetes type II Daughter  . Obesity Daughter  . Obesity Son  . Skin cancer Sister   Social History   Socioeconomic History  . Marital status: Married  Spouse name: Helen Folck  . Number of children: 2  . Years of education: Not on file  . Highest education level: High school graduate  Occupational History  . Occupation: Retired  Tobacco Use  . Smoking status: Never Smoker  . Smokeless tobacco: Never Used  Vaping Use  . Vaping Use: Never used  Substance and Sexual Activity  . Alcohol use: Never  Alcohol/week: 0.0 standard drinks  . Drug use: No  . Sexual activity: Never  Other Topics Concern  . Not on file  Social History Narrative  . Not on file   Social Determinants of Health   Financial Resource Strain:  . Difficulty of Paying Living Expenses:  Food Insecurity:  . Worried About Charity fundraiser in the Last Year:  . Arts development officer in the Last Year:  Transportation Needs:  . Film/video editor (Medical):  Marland Kitchen Lack of Transportation (Non-Medical):   Review of Systems:  A comprehensive 14 point ROS was performed, reviewed, and the pertinent orthopaedic findings are documented in the HPI.  Physical Exam: Vitals:  04/27/19 1406  BP: 124/80  Weight: 67.6 kg (149 lb)  Height: 152.4 cm (5')  PainSc: 0-No pain  PainLoc: Knee   General/Constitutional: The patient appears to be well-nourished, well-developed, and in no acute distress. Neuro/Psych: Normal mood and affect, oriented to person, place and time. Eyes: Non-icteric. Pupils are equal, round, and reactive to light, and exhibit synchronous movement. Lymphatic: No palpable adenopathy. Respiratory: Lungs clear to auscultation, Normal chest excursion, No wheezes and Non-labored breathing Cardiovascular: Regular rate and rhythm. No murmurs. and No edema, swelling or tenderness, except as noted in detailed exam. Vascular: No edema, swelling or tenderness, except as noted in detailed exam. Integumentary: No impressive skin lesions present, except as noted in detailed exam. Musculoskeletal: Unremarkable, except as noted in detailed exam.  Right knee exam: GAIT: moderate limp, favoring her right  leg, and uses a cane. ALIGNMENT: mild varus SKIN: unremarkable SWELLING: mild EFFUSION: small WARMTH: no warmth TENDERNESS: moderate over the medial joint line, no lateral joint line tenderness and no peripatellar tenderness ROM: 0-130 degrees with mild discomfort in maximal flexion McMURRAY'S: positive PATELLOFEMORAL: normal tracking with no peri-patellar tenderness and negative apprehension sign CREPITUS: no LACHMAN'S: negative PIVOT SHIFT: negative ANTERIOR DRAWER: negative POSTERIOR DRAWER: negative VARUS/VALGUS: Mildly positive pseudolaxity to varus stressing  She is neurovascularly intact to the right lower extremity and foot.  Knee Imaging: AP  weightbearing of both knees, as well as lateral and merchant views of the right knee are obtained. These films demonstrate severe degenerative changes, primarily involving the medial compartment with 100% medial joint space narrowing. The lateral and patellofemoral compartments appear to be well-maintained. Overall alignment is mild varus. No fractures, lytic lesions, or abnormal calcifications are noted.  Assessment:  . Primary osteoarthritis of right knee   Plan: The treatment options were discussed with the patient and her husband. In addition, patient educational materials were provided regarding the diagnosis and treatment options. The patient is quite frustrated by her symptoms and functional limitations, and is ready to consider more aggressive treatment options. Given her x-ray and examination findings on today's visit, as well as her MRI findings of the right knee from October, 2019, I have recommended surgical procedure, specifically a right partial knee replacement. The procedure was discussed with the patient, as were the potential risks (including bleeding, infection, nerve and/or blood vessel injury, persistent or recurrent pain, loosening and/or failure of the components, dislocation, need for further surgery, blood clots, strokes, heart attacks and/or arhythmias, pneumonia, etc.) and benefits. The patient states his/her understanding and wishes to proceed. All of the patient's questions and concerns were answered. She can call any time with further concerns. She will follow up post-surgery, routine.   H&P reviewed and patient re-examined. No changes.

## 2019-05-26 NOTE — Evaluation (Signed)
Physical Therapy Evaluation Patient Details Name: Christine Ballard MRN: [id number] DOB: April 15, 1940 Today's Date: 05/26/2019   History of Present Illness  Pt is a 79 yo female diagnosed with OA of the R knee and is s/p elective R unicondylar knee arthroplasty.  PMH includes: COPD, HTN, anxiety, depression, skin CA, and CKD.    Clinical Impression  Pt pleasant and motivated to participate during the session.  Pt performed very well during PT evaluation especially considering POD#0 status.  Pt was Mod Ind with bed mob tasks and required no physical assistance with transfers.  Pt showed fair carryover regarding proper sequencing with transfers as well as good eccentric and concentric control.  Pt initially ambulated with step-to pattern but progressed to step-through pattern with the RW with good control and stability throughout.  Pt reported no adverse symptoms including no pain during the session and is expected to make very good progress towards goals while in acute care.  Pt will benefit from HHPT services upon discharge to safely address deficits listed in patient problem list for decreased caregiver assistance and eventual return to PLOF.      Follow Up Recommendations Home health PT;Supervision - Intermittent    Equipment Recommendations  None recommended by PT    Recommendations for Other Services       Precautions / Restrictions Precautions Precautions: Fall;Knee Restrictions Weight Bearing Restrictions: Yes RLE Weight Bearing: Weight bearing as tolerated      Mobility  Bed Mobility Overal bed mobility: Modified Independent             General bed mobility comments: Extra time and effort required  Transfers Overall transfer level: Needs assistance Equipment used: Rolling walker (2 wheeled) Transfers: Sit to/from Stand Sit to Stand: Min guard         General transfer comment: Mod verbal and tactile cues for sequencing  Ambulation/Gait Ambulation/Gait  assistance: Min guard Gait Distance (Feet): 30 Feet x 1, 10 Feet x 1 Assistive device: Rolling walker (2 wheeled) Gait Pattern/deviations: Step-through pattern;Step-to pattern;Decreased stance time - right;Decreased step length - right;Decreased step length - left Gait velocity: decreased   General Gait Details: Step-to pattern progressed to step-through during the session with good stability throughout  Stairs            Wheelchair Mobility    Modified Rankin (Stroke Patients Only)       Balance Overall balance assessment: Needs assistance   Sitting balance-Leahy Scale: Normal     Standing balance support: Bilateral upper extremity supported Standing balance-Leahy Scale: Good                               Pertinent Vitals/Pain Pain Assessment: No/denies pain    Home Living Family/patient expects to be discharged to:: Private residence Living Arrangements: Spouse/significant other Available Help at Discharge: Family;Available 24 hours/day;Other (Comment)(Spouse limited physically, other family members available 24/7) Type of Home: House Home Access: Ramped entrance     Home Layout: One level Home Equipment: Walker - 2 wheels;Walker - 4 wheels;Cane - single point;Bedside commode;Grab bars - tub/shower;Shower seat      Prior Function Level of Independence: Independent with assistive device(s)         Comments: Pt Ind with amb without an AD in the home, SPC or RW use in the community, no fall history, Ind with ADLs     Hand Dominance        Extremity/Trunk Assessment  Upper Extremity Assessment Upper Extremity Assessment: Overall WFL for tasks assessed    Lower Extremity Assessment Lower Extremity Assessment: Generalized weakness;RLE deficits/detail RLE Deficits / Details: BLE sensation to light touch and ankle strength/AROM WNL, RLE hip flex and knee ext strength >/= 3/5 RLE Sensation: WNL       Communication   Communication: No  difficulties  Cognition Arousal/Alertness: Awake/alert Behavior During Therapy: WFL for tasks assessed/performed Overall Cognitive Status: Within Functional Limits for tasks assessed                                        General Comments      Exercises Total Joint Exercises Ankle Circles/Pumps: Strengthening;Both;10 reps;5 reps Quad Sets: AROM;Strengthening;Both;10 reps;5 reps Gluteal Sets: Strengthening;Both;10 reps Hip ABduction/ADduction: AROM;Both;5 reps Straight Leg Raises: AROM;Both;5 reps Long Arc Quad: AROM;Strengthening;Right;10 reps;15 reps Knee Flexion: AROM;Strengthening;Right;10 reps;15 reps Goniometric ROM: R knee AROM: 3-101 deg Marching in Standing: AROM;Strengthening;Both;10 reps Other Exercises Other Exercises: Sit to/from stand transfer training from various height surfaces with cues for sequencing Other Exercises: Positioning education to encourage R knee ext PROM Other Exercises: HEP education and review per handout   Assessment/Plan    PT Assessment Patient needs continued PT services  PT Problem List Decreased strength;Decreased range of motion;Decreased activity tolerance;Decreased balance;Decreased mobility;Decreased knowledge of use of DME       PT Treatment Interventions DME instruction;Gait training;Stair training;Functional mobility training;Therapeutic activities;Therapeutic exercise;Balance training;Patient/family education    PT Goals (Current goals can be found in the Care Plan section)  Acute Rehab PT Goals Patient Stated Goal: To walk better without pain PT Goal Formulation: With patient Time For Goal Achievement: 06/08/19 Potential to Achieve Goals: Good    Frequency BID   Barriers to discharge        Co-evaluation               AM-PAC PT "6 Clicks" Mobility  Outcome Measure Help needed turning from your back to your side while in a flat bed without using bedrails?: A Little Help needed moving from lying  on your back to sitting on the side of a flat bed without using bedrails?: A Little Help needed moving to and from a bed to a chair (including a wheelchair)?: A Little Help needed standing up from a chair using your arms (e.g., wheelchair or bedside chair)?: A Little Help needed to walk in hospital room?: A Little Help needed climbing 3-5 steps with a railing? : A Little 6 Click Score: 18    End of Session Equipment Utilized During Treatment: Gait belt Activity Tolerance: Patient tolerated treatment well Patient left: in chair;with call bell/phone within reach;with chair alarm set;with SCD's reapplied;Other (comment);with family/visitor present(Polar care to R knee) Nurse Communication: Mobility status PT Visit Diagnosis: Other abnormalities of gait and mobility (R26.89);Muscle weakness (generalized) (M62.81)    Time: [id number] PT Time Calculation (min) (ACUTE ONLY): 46 min   Charges:   PT Evaluation $PT Eval Moderate Complexity: 1 Mod PT Treatments $Therapeutic Exercise: 8-22 mins $Therapeutic Activity: 8-22 mins        D. Royetta Asal PT, DPT 05/26/19, 5:40 PM

## 2019-05-26 NOTE — Anesthesia Procedure Notes (Signed)
Spinal  Patient location during procedure: OR Start time: 05/26/2019 10:15 AM End time: 05/26/2019 10:27 AM Staffing Performed: resident/CRNA and anesthesiologist  Anesthesiologist: Emmie Niemann, MD Resident/CRNA: Justus Memory, CRNA Preanesthetic Checklist Completed: patient identified, IV checked, site marked, risks and benefits discussed, surgical consent, monitors and equipment checked, pre-op evaluation and timeout performed Spinal Block Patient position: sitting Prep: ChloraPrep Patient monitoring: heart rate, continuous pulse ox and blood pressure Approach: midline Location: L4-5 Injection technique: single-shot Needle Needle type: Introducer and Pencil-Tip  Needle gauge: 24 G Needle length: 9 cm Additional Notes Negative paresthesia. Negative blood return. Positive free-flowing CSF. Expiration date of kit checked and confirmed. Patient tolerated procedure well, without complications.

## 2019-05-26 NOTE — Transfer of Care (Signed)
Immediate Anesthesia Transfer of Care Note  Patient: Christine Ballard  Procedure(s) Performed: UNICOMPARTMENTAL KNEE (Right Knee)  Patient Location: PACU  Anesthesia Type:Spinal  Level of Consciousness: sedated  Airway & Oxygen Therapy: Patient Spontanous Breathing and Patient connected to nasal cannula oxygen  Post-op Assessment: Report given to RN and Post -op Vital signs reviewed and stable  Post vital signs: Reviewed and stable  Last Vitals:  Vitals Value Taken Time  BP 108/59 05/26/19 1233  Temp 37.7 C 05/26/19 1233  Pulse 72 05/26/19 1237  Resp 13 05/26/19 1237  SpO2 96 % 05/26/19 1237  Vitals shown include unvalidated device data.  Last Pain:  Vitals:   05/26/19 0931  TempSrc: Tympanic  PainSc: 0-No pain         Complications: No apparent anesthesia complications

## 2019-05-26 NOTE — Anesthesia Preprocedure Evaluation (Signed)
Anesthesia Evaluation  Patient identified by MRN, date of birth, ID band Patient awake    Reviewed: Allergy & Precautions, NPO status , Patient's Chart, lab work & pertinent test results  History of Anesthesia Complications Negative for: history of anesthetic complications  Airway Mallampati: III  TM Distance: >3 FB Neck ROM: Full    Dental no notable dental hx.    Pulmonary asthma , neg sleep apnea, COPD,  COPD inhaler,    breath sounds clear to auscultation- rhonchi (-) wheezing      Cardiovascular hypertension, Pt. on medications (-) CAD, (-) Past MI, (-) Cardiac Stents and (-) CABG  Rhythm:Regular Rate:Normal - Systolic murmurs and - Diastolic murmurs    Neuro/Psych  Headaches, neg Seizures PSYCHIATRIC DISORDERS Anxiety Depression    GI/Hepatic Neg liver ROS, GERD  ,  Endo/Other  negative endocrine ROSneg diabetes  Renal/GU CRFRenal disease     Musculoskeletal  (+) Arthritis ,   Abdominal (+) - obese,   Peds  Hematology negative hematology ROS (+)   Anesthesia Other Findings Past Medical History: No date: Allergic rhinitis No date: Allergy-induced asthma     Comment:  seasonal allergies No date: Anxiety No date: Arthritis     Comment:  chronic hip pain,right No date: Cancer ([place])     Comment:  skin on back No date: Cataracts, bilateral     Comment:  "small" No date: Chronic kidney disease     Comment:  mild renal insufficiency No date: Constipation No date: COPD (chronic obstructive pulmonary disease) (HCC) No date: Depression No date: Dyspnea No date: GERD (gastroesophageal reflux disease) No date: Headache     Comment:  occular No date: History of bronchitis     Comment:  "about twice a year" No date: History of hematuria No date: Hypertension No date: Hypoglycemia     Comment:  distant past No date: Osteopenia No date: Osteopenia No date: Torn meniscus     Comment:  bilateral No date:  Vertigo     Comment:  2-3x/yr No date: Wears glasses   Reproductive/Obstetrics                             Lab Results  Component Value Date   WBC 7.7 05/15/2019   HGB 12.6 05/15/2019   HCT 38.2 05/15/2019   MCV 85.5 05/15/2019   PLT 289 05/15/2019    Anesthesia Physical Anesthesia Plan  ASA: III  Anesthesia Plan: Spinal   Post-op Pain Management:    Induction:   PONV Risk Score and Plan: 2 and Propofol infusion  Airway Management Planned: Natural Airway  Additional Equipment:   Intra-op Plan:   Post-operative Plan:   Informed Consent: I have reviewed the patients History and Physical, chart, labs and discussed the procedure including the risks, benefits and alternatives for the proposed anesthesia with the patient or authorized representative who has indicated his/her understanding and acceptance.     Dental advisory given  Plan Discussed with: CRNA and Anesthesiologist  Anesthesia Plan Comments:         Anesthesia Quick Evaluation

## 2019-05-26 NOTE — Discharge Summary (Signed)
Physician Discharge Summary  Patient ID: Christine Ballard MRN: [id number] DOB/AGE: [birth year] 79 y.o.  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/26/2019   Cervical spondylosis with radiculopathy 06/26/2016    Past Medical History:  Diagnosis Date   Allergic rhinitis    Allergy-induced asthma    seasonal allergies   Anxiety    Arthritis    chronic hip pain,right   Cancer ([place])    skin on back   Cataracts, bilateral    "small"   Chronic kidney disease    mild renal insufficiency   Constipation    COPD (chronic obstructive pulmonary disease) (HCC)    Depression    Dyspnea    GERD (gastroesophageal reflux disease)    Headache    occular   History of bronchitis    "about twice a year"   History of hematuria    Hypertension    Hypoglycemia    distant past   Osteopenia    Osteopenia    Torn meniscus    bilateral   Vertigo    2-3x/yr   Wears glasses      Transfusion: None.   Consultants (if any):    Discharged Condition: Improved  Hospital Course: Christine Ballard is an 79 y.o. 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    Surgeries: Procedure(s): UNICOMPARTMENTAL KNEE on 05/26/2019 Patient tolerated the surgery well. Taken to PACU where she was stabilized and then transferred to the orthopedic floor.  Started on Lovenox 40mg  q 24 hrs. Foot pumps applied bilaterally at 80 mm. Heels elevated on bed with rolled towels. No evidence of DVT. Negative Homan. Physical therapy started on day #1 for gait training and transfer. OT started day #1 for ADL and assisted devices.  Patient's IV was removed on  POD1.  Implants: All-cemented Biomet Oxford system with an extra small femoral component, a "AA" sized tibial tray, and a 6 mm meniscal bearing insert.  She was given perioperative antibiotics:  Anti-infectives (From admission, onward)    Start     Dose/Rate Route Frequency Ordered Stop   05/26/19 1700  clindamycin (CLEOCIN) IVPB 600 mg     600 mg 100 mL/hr over 30 Minutes Intravenous Every 6 hours 05/26/19 1609 05/27/19 0555   05/26/19 0919  clindamycin (CLEOCIN) 900 MG/50ML IVPB    Note to Pharmacy: Myles Lipps   : cabinet override      05/26/19 0919 05/26/19 1038   05/26/19 0600  clindamycin (CLEOCIN) IVPB 900 mg     900 mg 100 mL/hr over 30 Minutes Intravenous On call to O.R. 05/25/19 2212 05/26/19 1044     .  She was given sequential compression devices, early ambulation, and Lovenox for DVT prophylaxis.  She benefited maximally from the hospital stay and there were no complications.    Recent vital signs:  Vitals:   05/27/19 0745 05/27/19 1131  BP: 124/62 113/70  Pulse: 66 74  Resp: 16 16  Temp: 97.8 F (36.6 C) 97.6 F (36.4 C)  SpO2: 93% 96%    Recent laboratory studies:  Lab Results  Component Value Date   HGB 9.9 (L) 05/27/2019   HGB 12.6 05/15/2019   HGB  13.8 07/08/2018   Lab Results  Component Value Date   WBC 7.2 05/27/2019   PLT 231 05/27/2019   No results found for: INR Lab Results  Component Value Date   NA 140 05/27/2019   K 4.0 05/27/2019   CL 108 05/27/2019   CO2 26 05/27/2019   BUN 12 05/27/2019   CREATININE 0.75 05/27/2019   GLUCOSE 106 (H) 05/27/2019    Discharge Medications:   Allergies as of 05/27/2019       Reactions   Bisacodyl Other (See Comments)   Extreme cramping.   Ciprofloxacin Other (See Comments)   Muscle pain and stiffness   Penicillins Rash   Did it involve swelling of the face/tongue/throat, SOB, or low BP? No Did it involve sudden or severe rash/hives, skin peeling, or any reaction on the inside of your  mouth or nose? Yes Did you need to seek medical attention at a hospital or doctor's office? Yes When did it last happen?      childhood allergy If all above answers are "NO", may proceed with cephalosporin use.   Sulfa Antibiotics Rash        Medication List     STOP taking these medications    meloxicam 15 MG tablet Commonly known as: MOBIC       TAKE these medications    acetaminophen 650 MG CR tablet Commonly known as: TYLENOL Take 650 mg by mouth See admin instructions. Take 1 tablet (650 mg) by mouth scheduled twice daily, may take an additional dose at mid afternoon if needed for pain.   amLODipine 5 MG tablet Commonly known as: NORVASC Take 7.5 mg by mouth daily.   cetirizine 10 MG tablet Commonly known as: ZYRTEC Take 10 mg by mouth daily as needed for allergies.   docusate sodium 100 MG capsule Commonly known as: COLACE Take 100-200 mg by mouth daily as needed for mild constipation.   DULoxetine 60 MG capsule Commonly known as: CYMBALTA Take 60 mg by mouth at bedtime.   enoxaparin 40 MG/0.4ML injection Commonly known as: LOVENOX Inject 0.4 mLs (40 mg total) into the skin daily.   fluticasone 50 MCG/ACT nasal spray Commonly known as: FLONASE Place 1 spray into the nose daily as needed for allergies.   GENTEAL TEARS OP Place 1 drop into both eyes 4 (four) times daily as needed (irritation).   meclizine 12.5 MG tablet Commonly known as: ANTIVERT Take 12.5 mg by mouth 3 (three) times daily as needed (vertigo).   Muscle Rub 10-15 % Crea Apply 1 application topically 3 (three) times daily as needed (knee pain/arthritis.).   omeprazole 40 MG capsule Commonly known as: PRILOSEC Take 40 mg by mouth daily in the afternoon.   oxyCODONE 5 MG immediate release tablet Commonly known as: Oxy IR/ROXICODONE Take 1-2 tablets (5-10 mg total) by mouth every 4 (four) hours as needed for moderate pain (pain score 4-6).   psyllium 58.6 % packet Commonly known  as: METAMUCIL Take 1 packet by mouth daily as needed (regularity/constipation.).   Symbicort 160-4.5 MCG/ACT inhaler Generic drug: budesonide-formoterol Inhale 2 puffs into the lungs in the morning and at bedtime.   Vitamin D-1000 Max St 25 MCG (1000 UT) tablet Generic drug: Cholecalciferol Take 1,000 Units by mouth daily.               Durable Medical Equipment  (From admission, onward)           Start     Ordered   05/26/19 1610  DME Bedside commode  Once    Question:  Patient needs a bedside commode to treat with the following condition  Answer:  Status post right partial knee replacement   05/26/19 1609   05/26/19 1610  DME 3 n 1  Once     05/26/19 1609   05/26/19 1610  DME Walker rolling  Once    Question Answer Comment  Walker: With 5 Inch Wheels   Patient needs a walker to treat with the following condition Status post right partial knee replacement      05/26/19 1609           Diagnostic Studies: DG Knee Right Port  Result Date: 05/26/2019 CLINICAL DATA:  Postop right knee hemiarthroplasty. EXAM: PORTABLE RIGHT KNEE - 1-2 VIEW COMPARISON:  Radiographs 09/20/2017 FINDINGS: There are well seated components of a medial compartment arthroplasty. No complicating features are identified. IMPRESSION: Well seated components of a medial compartment arthroplasty. Electronically Signed   By: Marijo Sanes M.D.   On: 05/26/2019 13:16    Disposition: Discharge disposition: 01-Home or Self Care    Plan for discharge home on 05/27/19 pending progress with PT.   Follow-up Information     Lattie Corns, PA-C. Go on 06/10/2019.   Specialty: Physician Assistant Why: Staple Removal at 9:45 am Contact information: [place] [id number]           Signed: Judson Roch PA-C 05/27/2019, 1:20 PM

## 2019-05-26 NOTE — Op Note (Signed)
05/26/2019  12:09 PM  Patient:   Christine Ballard  Pre-Op Diagnosis:   Osteoarthritis of medial compartment, right knee.  Post-Op Diagnosis:   Same  Procedure:   Right unicondylar knee arthroplasty.  Surgeon:   Pascal Lux, MD  Assistant:   Cameron Proud, PA-C  Anesthesia:   Spinal  Findings:   As above.  Complications:   None  EBL:   5 cc  Fluids:   600 cc crystalloid  UOP:   None  TT:   70 minutes at 300 mmHg  Drains:   None  Closure:   Staples  Implants:   All-cemented Biomet Oxford system with an extra small femoral component, a "AA" sized tibial tray, and a 6 mm meniscal bearing insert.  Brief Clinical Note:   The patient is a 79 year old female with a several year history of progressively worsening medial sided right knee pain following an apparent medial meniscus root tear that which was treated nonsurgically. Her symptoms have progressed despite medications, activity modification, etc. Her history and examination are consistent with degenerative joint disease, primarily involving the medial compartment as confirmed by plain radiographs and a preoperative MRI scan. The patient presents at this time for a right partial knee replacement.  Procedure:   The patient was brought into the operating room and a spinal placed by the anesthesiologist. The patient was lain in the supine position on the OR table and positioned so that the non-surgical leg was placed in a flexed and abducted position in the yellow fin leg holder while the surgical extremity was placed over the Biomet leg holder. The right lower extremity was prepped with ChloraPrep solution before being draped sterilely. Preoperative antibiotics were administered. After performing a timeout to verify the appropriate surgical site, the limb was exsanguinated with an Esmarch and the tourniquet inflated to 300 mmHg.   A standard anterior approach to the knee was made through an approximately 3.5-4 inch incision. The  incision was carried down through the subcutaneous tissues to expose the superficial retinaculum. This was split the length the incision and the medial flap elevated sufficiently to expose the medial retinaculum. The medial retinaculum was incised along the medial border of the patella tendon and extended proximally along the medial border of the patella, leaving a 3-4 mm cuff of tissue. The soft tissues were elevated off the anteromedial aspect of the proximal tibia. The anterior portion of the meniscus was removed after performing a subtotal excision of the infrapatellar fat pad. The anterior cruciate ligament was inspected and found to be in excellent condition. Osteophytes were removed from the inferior pole of the patella as well as from the notch using a quarter-inch osteotome. There were significant degenerative changes of both the femur and tibia on the medial side. The medial femoral condyle was sized using the small and extra small sizers. It was felt that the extra small guide best optimized the contour of the femur. This was left in place and the external tibial guide positioned. The coupling device was used to connect the guide to the medial femoral condylar sizer to optimize appropriate orientation. Two guide pins were inserted into the cutting block before the coupling device and sizer were removed. The appropriate tibial cut was made using the oscillating and reciprocating saws. The piece was removed in its entirety and taken to the back table where it was sized and found to be optimally replicated by a "AA" sized component. The 8 mm spacer was inserted to verify that  sufficient bone had been removed.  Attention was directed to femoral side. The intramedullary canal was accessed through a 4 mm drill hole. The intramedullary guide was positioned before the guide for the femoral condylar holes was positioned. The appropriate coupling device connected this guide to the intramedullary guide before both  drill holes were created in the distal aspect of the medial femoral condyle. The devices were removed and the posterior condylar cutting block inserted. The appropriate cut was made using the reciprocating saw and this piece removed. The #0 spigot was inserted and the initial bone milling performed. A trial femoral component was inserted and both the flexion and extension gaps measured. In flexion, the gap measured 8 mm whereas in extension, it measured 4 mm. Therefore, the #4 spigot was selected and the secondary bone milling performed. Repeat sizing demonstrated symmetric flexion and extension gaps. The bone was removed from the postero-medial and postero-lateral aspects of the femoral condyle, as well as from the beneath the collar of the spigot. Bone also was removed from the anterior portion of the femur so as to minimize any potential impingement with the meniscal bearing insert. The trial components removed and several drill holes placed into the distal femoral condyle to further augment cement fixation.  Attention was redirected to the tibial side. The "AA" sized tibial tray was positioned and temporarily secured using the appropriate spiked nail. The keel was created using the bi-bladed reciprocating saw and hoe. The keeled "AA" sized trial tibial tray was inserted to be sure that it seated properly. At this point, a total of 20 cc of Exparel diluted out to 40 cc with normal saline and 30 cc of 0.5% Sensorcaine was injected in and around the posterior and medial capsular tissues, as well as the peri-incisional tissues to help with postoperative pain control.  The bony surfaces were prepared for cementing by irrigating them thoroughly with bacitracin saline solution using the jet lavage system before packing them with a dry Ray-Tec sponge. Meanwhile, cement was being mixed on the back table. When the cement was ready, the tibial tray was cemented in first. The excess cement was removed using a Engineering geologist after impacting it into place. Next, the femoral component was impacted into place. Again the excess cement was removed using a Surveyor, quantity. The 6 mm spacer was inserted and the knee brought into near full extension while the cement hardened. Once the cement hardened, the spacer was removed and the 6 mm meniscal bearing insert was trialed. This demonstrated excellent tracking while the knee was placed through a range of motion, and showed no evidence towards subluxation or dislocation. In addition, it did not fit too tightly. Therefore, the permanent 6 mm meniscal bearing insert was snapped into position after verifying that no cement had been retained posteriorly. Again the knee was placed through a range of motion with the findings as described above.  The wound was copiously irrigated with sterile saline solution via the jet lavage system before the retinacular layer was reapproximated using #0 Vicryl interrupted sutures. At this point, 1 g of transexemic acid in 10 cc of normal saline was injected intra-articularly. The subcutaneous tissues were closed in two layers using 2-0 Vicryl interrupted sutures before the skin was closed using staples. A sterile occlusive dressing was applied to the knee before the patient was awakened. The patient was transferred back to his/her hospital bed and returned to the recovery room in satisfactory condition after tolerating the procedure well. A Polar Care  device was applied to the knee as well.

## 2019-05-26 NOTE — Discharge Instructions (Signed)

## 2019-05-27 DIAGNOSIS — M1711 Unilateral primary osteoarthritis, right knee: Secondary | ICD-10-CM | POA: Diagnosis not present

## 2019-05-27 LAB — BASIC METABOLIC PANEL
Anion gap: 6 (ref 5–15)
BUN: 12 mg/dL (ref 8–23)
CO2: 26 mmol/L (ref 22–32)
Calcium: 8.5 mg/dL — ABNORMAL LOW (ref 8.9–10.3)
Chloride: 108 mmol/L (ref 98–111)
Creatinine, Ser: 0.75 mg/dL (ref 0.44–1.00)
GFR calc Af Amer: >60 mL/min (ref >60)
GFR calc non Af Amer: >60 mL/min (ref >60)
Glucose, Bld: 106 mg/dL — ABNORMAL HIGH (ref 70–99)
Potassium: 4 mmol/L (ref 3.5–5.1)
Sodium: 140 mmol/L (ref 135–145)

## 2019-05-27 LAB — CBC
HCT: 31 % — ABNORMAL LOW (ref 36.0–46.0)
Hemoglobin: 9.9 g/dL — ABNORMAL LOW (ref 12.0–15.0)
MCH: 28.5 pg (ref 26.0–34.0)
MCHC: 31.9 g/dL (ref 30.0–36.0)
MCV: 89.3 fL (ref 80.0–100.0)
Platelets: 231 10*3/uL (ref 150–400)
RBC: 3.47 MIL/uL — ABNORMAL LOW (ref 3.87–5.11)
RDW: 14 % (ref 11.5–15.5)
WBC: 7.2 10*3/uL (ref 4.0–10.5)
nRBC: 0 % (ref 0.0–0.2)

## 2019-05-27 LAB — ABO/RH: ABO/RH(D): O POS

## 2019-05-27 NOTE — Plan of Care (Signed)
  Problem: Education: Goal: Knowledge of General Education information will improve Description: Including pain rating scale, medication(s)/side effects and non-pharmacologic comfort measures Outcome: Progressing   Problem: Clinical Measurements: Goal: Will remain free from infection Outcome: Progressing   Problem: Coping: Goal: Level of anxiety will decrease Outcome: Progressing   Problem: Elimination: Goal: Will not experience complications related to bowel motility Outcome: Progressing   

## 2019-05-27 NOTE — Anesthesia Postprocedure Evaluation (Signed)
Anesthesia Post Note  Patient: Christine Ballard  Procedure(s) Performed: UNICOMPARTMENTAL KNEE (Right Knee)  Patient location during evaluation: Nursing Unit Anesthesia Type: Spinal Level of consciousness: oriented and awake and alert Pain management: pain level controlled Vital Signs Assessment: post-procedure vital signs reviewed and stable Respiratory status: spontaneous breathing and respiratory function stable Cardiovascular status: blood pressure returned to baseline and stable Postop Assessment: no headache, no backache, no apparent nausea or vomiting and patient able to bend at knees Anesthetic complications: no     Last Vitals:  Vitals:   05/27/19 0003 05/27/19 0453  BP: (!) 100/55 (!) 130/59  Pulse: 69 66  Resp: 18   Temp: 36.4 C 36.7 C  SpO2: 92% 92%    Last Pain:  Vitals:   05/26/19 2015  TempSrc:   PainSc: 0-No pain                 Joeli Fenner Lorenza Chick

## 2019-05-27 NOTE — Progress Notes (Signed)
Pt ready for discharge home today per MD. Patient assessment unchanged from this morning. She did have some bloody drainage from incision that saturated honeycomb dressing after PT session. Lance PA notified and gave orders to change honeycomb and reinforce with ABDs and ace. Dressing changed per order. Reviewed discharge instructions and prescriptions with pt; all questions answered and pt verbalized understanding. PIV removed, VSS. Pt waiting for ride.   Ethelda Chick

## 2019-05-27 NOTE — Progress Notes (Signed)
Physical Therapy Treatment Patient Details Name: Christine Ballard MRN: [id number] DOB: 11/04/1940 Today's Date: 05/27/2019    History of Present Illness Pt is a 79 yo female diagnosed with OA of the R knee and is s/p elective R unicondylar knee arthroplasty.  PMH includes: COPD, HTN, anxiety, depression, skin CA, and CKD.    PT Comments    Pt ready for session.  No pain at rest with meds.  Participated in exercises as described below. To EOB without assist and is able to stand and complete stair training this am with RW and min guard/supervision.  Excellent progression meeting therapy goals this am. No LOB, buckling, hesitance with mobility or safety issues noted.  Pt stated she anticipates discharge home today.  Will monitor discharge plan and see this pm if she is not discharged.   Follow Up Recommendations  Home health PT;Supervision - Intermittent     Equipment Recommendations  None recommended by PT    Recommendations for Other Services       Precautions / Restrictions Precautions Precautions: Fall;Knee Restrictions Weight Bearing Restrictions: Yes RLE Weight Bearing: Weight bearing as tolerated    Mobility  Bed Mobility Overal bed mobility: Modified Independent                Transfers Overall transfer level: Modified independent Equipment used: Rolling walker (2 wheeled) Transfers: Sit to/from Stand Sit to Stand: Supervision;Modified independent (Device/Increase time)            Ambulation/Gait Ambulation/Gait assistance: Supervision Gait Distance (Feet): 200 Feet Assistive device: Rolling walker (2 wheeled) Gait Pattern/deviations: Step-through pattern;Decreased step length - right;Decreased step length - left;Decreased stance time - right Gait velocity: decreased   General Gait Details: generally safe and steady with gait.  minor limp but does not affect safety   Stairs Stairs: Yes Stairs assistance: Supervision Stair Management: Two rails;Step  to pattern Number of Stairs: 4 General stair comments: ramp at home to access.  stairs to basement which she does not need to use at this time.   Wheelchair Mobility    Modified Rankin (Stroke Patients Only)       Balance Overall balance assessment: Modified Independent   Sitting balance-Leahy Scale: Normal       Standing balance-Leahy Scale: Good                              Cognition Arousal/Alertness: Awake/alert Behavior During Therapy: WFL for tasks assessed/performed Overall Cognitive Status: Within Functional Limits for tasks assessed                                        Exercises Total Joint Exercises Ankle Circles/Pumps: Strengthening;Both;10 reps;5 reps Quad Sets: AROM;Strengthening;Both;10 reps;5 reps Gluteal Sets: Strengthening;Both;10 reps Heel Slides: AROM;Strengthening;10 reps;Right Straight Leg Raises: AROM;10 reps;Right Long Arc Quad: AROM;Strengthening;Right;10 reps Knee Flexion: AROM;Strengthening;Right;10 reps Goniometric ROM: 0-110 - with ease and no significant discomfort    General Comments        Pertinent Vitals/Pain Pain Assessment: No/denies pain    Home Living                      Prior Function            PT Goals (current goals can now be found in the care plan section) Progress towards PT goals: Progressing toward goals  Frequency    BID      PT Plan Current plan remains appropriate    Co-evaluation              AM-PAC PT "6 Clicks" Mobility   Outcome Measure  Help needed turning from your back to your side while in a flat bed without using bedrails?: None Help needed moving from lying on your back to sitting on the side of a flat bed without using bedrails?: None Help needed moving to and from a bed to a chair (including a wheelchair)?: None Help needed standing up from a chair using your arms (e.g., wheelchair or bedside chair)?: None Help needed to walk in  hospital room?: A Little Help needed climbing 3-5 steps with a railing? : A Little 6 Click Score: 22    End of Session Equipment Utilized During Treatment: Gait belt Activity Tolerance: Patient tolerated treatment well Patient left: in chair;with call bell/phone within reach;with chair alarm set Nurse Communication: Mobility status       Time: [id number] PT Time Calculation (min) (ACUTE ONLY): 18 min  Charges:  $Gait Training: 8-22 mins                    Chesley Noon, PTA 05/27/19, 9:25 AM

## 2019-08-18 ENCOUNTER — Other Ambulatory Visit: Payer: Self-pay | Admitting: Infectious Diseases

## 2019-08-18 DIAGNOSIS — Z1231 Encounter for screening mammogram for malignant neoplasm of breast: Secondary | ICD-10-CM

## 2019-09-28 ENCOUNTER — Other Ambulatory Visit: Payer: Self-pay

## 2019-09-28 ENCOUNTER — Ambulatory Visit
Admission: RE | Admit: 2019-09-28 | Discharge: 2019-09-28 | Disposition: A | Discharge status: Home / Self Care | Payer: Medicare PPO | Source: Ambulatory Visit | Attending: Infectious Diseases | Admitting: Infectious Diseases

## 2019-09-28 DIAGNOSIS — Z1231 Encounter for screening mammogram for malignant neoplasm of breast: Secondary | ICD-10-CM | POA: Diagnosis not present

## 2019-11-30 ENCOUNTER — Other Ambulatory Visit: Payer: Self-pay

## 2019-11-30 ENCOUNTER — Other Ambulatory Visit
Admission: RE | Admit: 2019-11-30 | Discharge: 2019-11-30 | Disposition: A | Discharge status: Home / Self Care | Payer: Medicare PPO | Source: Ambulatory Visit | Attending: Surgery | Admitting: Surgery

## 2019-11-30 ENCOUNTER — Other Ambulatory Visit: Payer: Self-pay | Admitting: Surgery

## 2019-11-30 DIAGNOSIS — Z20822 Contact with and (suspected) exposure to covid-19: Secondary | ICD-10-CM | POA: Diagnosis not present

## 2019-11-30 DIAGNOSIS — Z01812 Encounter for preprocedural laboratory examination: Secondary | ICD-10-CM | POA: Insufficient documentation

## 2019-11-30 DIAGNOSIS — S52532A Colles' fracture of left radius, initial encounter for closed fracture: Secondary | ICD-10-CM | POA: Insufficient documentation

## 2019-11-30 LAB — SARS CORONAVIRUS 2 (TAT 6-24 HRS): SARS Coronavirus 2: NEGATIVE

## 2019-12-01 MED ORDER — ORAL CARE MOUTH RINSE: 15.0000 mL | Freq: Once | OROMUCOSAL | Status: DC

## 2019-12-01 MED ORDER — CHLORHEXIDINE GLUCONATE 0.12 % MT SOLN: 15.0000 mL | Freq: Once | OROMUCOSAL | Status: DC

## 2019-12-01 MED ORDER — LACTATED RINGERS IV SOLN: INTRAVENOUS | Status: DC

## 2019-12-02 ENCOUNTER — Other Ambulatory Visit: Payer: Self-pay

## 2019-12-02 ENCOUNTER — Encounter
Admission: RE | Disposition: A | Discharge status: Home / Self Care | Payer: Self-pay | Source: Home / Self Care | Attending: Surgery

## 2019-12-02 ENCOUNTER — Ambulatory Visit: Payer: Medicare PPO | Admitting: Anesthesiology

## 2019-12-02 ENCOUNTER — Ambulatory Visit
Admission: RE | Admit: 2019-12-02 | Discharge: 2019-12-02 | Disposition: A | Discharge status: Home / Self Care | Payer: Medicare PPO | Attending: Surgery | Admitting: Surgery

## 2019-12-02 ENCOUNTER — Ambulatory Visit: Payer: Medicare PPO

## 2019-12-02 ENCOUNTER — Encounter: Payer: Self-pay | Admitting: Surgery

## 2019-12-02 DIAGNOSIS — S52572A Other intraarticular fracture of lower end of left radius, initial encounter for closed fracture: Secondary | ICD-10-CM | POA: Diagnosis present

## 2019-12-02 DIAGNOSIS — Z882 Allergy status to sulfonamides status: Secondary | ICD-10-CM | POA: Diagnosis not present

## 2019-12-02 DIAGNOSIS — Z7951 Long term (current) use of inhaled steroids: Secondary | ICD-10-CM | POA: Diagnosis not present

## 2019-12-02 DIAGNOSIS — Z880 Allergy status to penicillin: Secondary | ICD-10-CM | POA: Diagnosis not present

## 2019-12-02 DIAGNOSIS — Z79899 Other long term (current) drug therapy: Secondary | ICD-10-CM | POA: Insufficient documentation

## 2019-12-02 DIAGNOSIS — R94138 Abnormal results of other function studies of peripheral nervous system: Secondary | ICD-10-CM

## 2019-12-02 DIAGNOSIS — Z881 Allergy status to other antibiotic agents status: Secondary | ICD-10-CM | POA: Insufficient documentation

## 2019-12-02 DIAGNOSIS — W010XXA Fall on same level from slipping, tripping and stumbling without subsequent striking against object, initial encounter: Secondary | ICD-10-CM | POA: Insufficient documentation

## 2019-12-02 HISTORY — PX: ORIF WRIST FRACTURE: SHX2133

## 2019-12-02 SURGERY — OPEN REDUCTION INTERNAL FIXATION (ORIF) WRIST FRACTURE
Anesthesia: General | Site: Wrist | Laterality: Left

## 2019-12-02 MED ORDER — ONDANSETRON HCL 4 MG/2ML IJ SOLN: 4.0000 mg | Freq: Once | INTRAMUSCULAR | Status: DC | PRN

## 2019-12-02 MED ORDER — FENTANYL CITRATE (PF) 100 MCG/2ML IJ SOLN
INTRAMUSCULAR | Status: AC
  Filled 2019-12-02: qty 2

## 2019-12-02 MED ORDER — FENTANYL CITRATE (PF) 100 MCG/2ML IJ SOLN
INTRAMUSCULAR | Status: DC | PRN
  Administered 2019-12-02 (×2): 50 ug via INTRAVENOUS

## 2019-12-02 MED ORDER — DEXAMETHASONE SODIUM PHOSPHATE 10 MG/ML IJ SOLN
INTRAMUSCULAR | Status: DC | PRN
  Administered 2019-12-02: 10 mg via INTRAVENOUS

## 2019-12-02 MED ORDER — ROPIVACAINE HCL 5 MG/ML IJ SOLN
INTRAMUSCULAR | Status: DC | PRN
  Administered 2019-12-02: 30 mL via EPIDURAL

## 2019-12-02 MED ORDER — LIDOCAINE HCL (PF) 2 % IJ SOLN
INTRAMUSCULAR | Status: DC | PRN
  Administered 2019-12-02: 50 mg

## 2019-12-02 MED ORDER — FENTANYL CITRATE (PF) 100 MCG/2ML IJ SOLN
25.0000 ug | INTRAMUSCULAR | Status: DC | PRN
  Administered 2019-12-02 (×2): 50 ug via INTRAVENOUS

## 2019-12-02 MED ORDER — ONDANSETRON HCL 4 MG/2ML IJ SOLN
INTRAMUSCULAR | Status: DC | PRN
  Administered 2019-12-02: 4 mg via INTRAVENOUS

## 2019-12-02 MED ORDER — PROPOFOL 10 MG/ML IV BOLUS
INTRAVENOUS | Status: DC | PRN
  Administered 2019-12-02: 150 mg via INTRAVENOUS

## 2019-12-02 MED ORDER — BUPIVACAINE HCL (PF) 0.5 % IJ SOLN
INTRAMUSCULAR | Status: AC
  Filled 2019-12-02: qty 30

## 2019-12-02 MED ORDER — LIDOCAINE HCL (PF) 1 % IJ SOLN
INTRAMUSCULAR | Status: AC
  Filled 2019-12-02: qty 5

## 2019-12-02 MED ORDER — LIDOCAINE HCL (PF) 1 % IJ SOLN
INTRAMUSCULAR | Status: DC | PRN
  Administered 2019-12-02: 5 mL

## 2019-12-02 MED ORDER — DEXAMETHASONE SODIUM PHOSPHATE 10 MG/ML IJ SOLN
INTRAMUSCULAR | Status: AC
  Filled 2019-12-02: qty 1

## 2019-12-02 MED ORDER — ONDANSETRON HCL 4 MG/2ML IJ SOLN
INTRAMUSCULAR | Status: AC
  Filled 2019-12-02: qty 2

## 2019-12-02 MED ORDER — CLINDAMYCIN PHOSPHATE 900 MG/50ML IV SOLN
900.0000 mg | INTRAVENOUS | Status: AC
  Administered 2019-12-02: 900 mg via INTRAVENOUS

## 2019-12-02 MED ORDER — CHLORHEXIDINE GLUCONATE 0.12 % MT SOLN
OROMUCOSAL | Status: AC
  Filled 2019-12-02: qty 15

## 2019-12-02 MED ORDER — ROPIVACAINE HCL 5 MG/ML IJ SOLN
INTRAMUSCULAR | Status: AC
  Filled 2019-12-02: qty 30

## 2019-12-02 MED ORDER — GLYCOPYRROLATE 0.2 MG/ML IJ SOLN
INTRAMUSCULAR | Status: DC | PRN
  Administered 2019-12-02: .2 mg via INTRAVENOUS

## 2019-12-02 MED ORDER — HYDROCODONE-ACETAMINOPHEN 5-325 MG PO TABS
1.0000 | ORAL_TABLET | Freq: Four times a day (QID) | ORAL | 0 refills | Status: DC | PRN
Start: 2019-12-02 — End: 2020-03-07

## 2019-12-02 MED ORDER — CLINDAMYCIN PHOSPHATE 900 MG/50ML IV SOLN
INTRAVENOUS | Status: AC
  Filled 2019-12-02: qty 50

## 2019-12-02 MED ORDER — PROPOFOL 10 MG/ML IV BOLUS
INTRAVENOUS | Status: AC
  Filled 2019-12-02: qty 20

## 2019-12-02 SURGICAL SUPPLY — 61 items
APL PRP STRL LF DISP 70% ISPRP (MISCELLANEOUS) ×2
BIT DRILL 2.2 SS TIBIAL (BIT) ×2 IMPLANT
BNDG COHESIVE 4X5 TAN STRL (GAUZE/BANDAGES/DRESSINGS) ×2 IMPLANT
BNDG ELASTIC 4X5.8 VLCR STR LF (GAUZE/BANDAGES/DRESSINGS) ×2 IMPLANT
BNDG ESMARK 4X12 TAN STRL LF (GAUZE/BANDAGES/DRESSINGS) ×2 IMPLANT
CANISTER SUCT 1200ML W/VALVE (MISCELLANEOUS) ×2 IMPLANT
CHLORAPREP W/TINT 26 (MISCELLANEOUS) ×4 IMPLANT
CORD BIP STRL DISP 12FT (MISCELLANEOUS) ×2 IMPLANT
COVER WAND RF STERILE (DRAPES) ×2 IMPLANT
CUFF TOURN SGL QUICK 18X4 (TOURNIQUET CUFF) IMPLANT
DRAPE FLUOR MINI C-ARM 54X84 (DRAPES) ×2 IMPLANT
DRAPE SPLIT 6X30 W/TAPE (DRAPES) ×2 IMPLANT
DRAPE SURG 17X11 SM STRL (DRAPES) ×2 IMPLANT
DRAPE U-SHAPE 47X51 STRL (DRAPES) ×2 IMPLANT
ELECT CAUTERY BLADE 6.4 (BLADE) ×2 IMPLANT
ELECT REM PT RETURN 9FT ADLT (ELECTROSURGICAL) ×2
ELECTRODE REM PT RTRN 9FT ADLT (ELECTROSURGICAL) ×1 IMPLANT
FORCEPS JEWEL BIP 4-3/4 STR (INSTRUMENTS) ×2 IMPLANT
GAUZE SPONGE 4X4 12PLY STRL (GAUZE/BANDAGES/DRESSINGS) ×2 IMPLANT
GAUZE XEROFORM 1X8 LF (GAUZE/BANDAGES/DRESSINGS) ×2 IMPLANT
GLOVE BIO SURGEON STRL SZ8 (GLOVE) ×2 IMPLANT
GLOVE INDICATOR 8.0 STRL GRN (GLOVE) ×2 IMPLANT
GOWN STRL REUS W/ TWL LRG LVL3 (GOWN DISPOSABLE) ×1 IMPLANT
GOWN STRL REUS W/ TWL XL LVL3 (GOWN DISPOSABLE) ×1 IMPLANT
GOWN STRL REUS W/TWL LRG LVL3 (GOWN DISPOSABLE) ×2
GOWN STRL REUS W/TWL XL LVL3 (GOWN DISPOSABLE) ×2
K-WIRE 1.6 (WIRE) ×2
K-WIRE FX5X1.6XNS BN SS (WIRE) ×1
KIT TURNOVER KIT A (KITS) ×2 IMPLANT
KWIRE FX5X1.6XNS BN SS (WIRE) ×1 IMPLANT
MANIFOLD NEPTUNE II (INSTRUMENTS) ×2 IMPLANT
NEEDLE FILTER BLUNT 18X 1/2SAF (NEEDLE) ×1
NEEDLE FILTER BLUNT 18X1 1/2 (NEEDLE) ×1 IMPLANT
NS IRRIG 500ML POUR BTL (IV SOLUTION) ×2 IMPLANT
PACK EXTREMITY (MISCELLANEOUS) ×2 IMPLANT
PADDING CAST 4IN STRL (MISCELLANEOUS) ×2
PADDING CAST BLEND 4X4 STRL (MISCELLANEOUS) ×2 IMPLANT
PEG LOCKING SMOOTH 2.2X12 (Peg) ×2 IMPLANT
PEG LOCKING SMOOTH 2.2X14 (Peg) ×4 IMPLANT
PEG LOCKING SMOOTH 2.2X15 (Peg) ×2 IMPLANT
PEG LOCKING SMOOTH 2.2X16 (Screw) ×2 IMPLANT
PEG LOCKING SMOOTH 2.2X18 (Peg) ×2 IMPLANT
PLATE BN MN NAR 41X22 CRSLCK (Plate) ×1 IMPLANT
PLATE CROSSLOCK NAR MINI LT (Plate) ×2 IMPLANT
SCREW 2.7X12MM (Screw) ×2 IMPLANT
SCREW LOCK 14X2.7X 3 LD TPR (Screw) ×2 IMPLANT
SCREW LOCKING 2.7X14 (Screw) ×4 IMPLANT
SCREW NONLOCK 2.7X18MM (Screw) ×2 IMPLANT
SLING ARM M TX990204 (SOFTGOODS) ×2 IMPLANT
SPLINT CAST 1 STEP 3X12 (MISCELLANEOUS) IMPLANT
SPLINT CAST 1 STEP 4X15 (MISCELLANEOUS) IMPLANT
STAPLER SKIN PROX 35W (STAPLE) ×2 IMPLANT
STOCKINETTE IMPERVIOUS 9X36 MD (GAUZE/BANDAGES/DRESSINGS) ×2 IMPLANT
STRIP CLOSURE SKIN 1/4X4 (GAUZE/BANDAGES/DRESSINGS) IMPLANT
SUT PROLENE 4 0 PS 2 18 (SUTURE) ×2 IMPLANT
SUT VIC AB 2-0 SH 27 (SUTURE) ×2
SUT VIC AB 2-0 SH 27XBRD (SUTURE) ×1 IMPLANT
SUT VIC AB 3-0 SH 27 (SUTURE) ×2
SUT VIC AB 3-0 SH 27X BRD (SUTURE) ×1 IMPLANT
SWABSTK COMLB BENZOIN TINCTURE (MISCELLANEOUS) IMPLANT
SYR 10ML LL (SYRINGE) ×2 IMPLANT

## 2019-12-02 NOTE — H&P (Signed)
History of Present Illness:  Christine Ballard is a 79 y.o. female who presents for evaluation and treatment of her left wrist pain. Apparently, several days ago, the patient lost her balance while trying to get into bed in the dark in order to avoid awakening her husband, and fell back onto her outstretched left hand. She presented to the [facility] urgent care clinic where x-rays demonstrated a moderately impacted intra-articular left distal radius fracture. The patient was placed into a sugar tong splint and advised to follow-up with orthopedics. She notes moderate to severe pain in the wrist which she rates as high as 8/10. She has been taking Tylenol as necessary with limited benefit. She denies any numbness or paresthesias to her fingers, and denies any prior injury to the left wrist.  Current Outpatient Medications: . acetaminophen (TYLENOL ARTHRITIS PAIN) 650 MG ER tablet Take 650 mg by mouth every 8 (eight) hours as needed for Pain  . amLODIPine (NORVASC) 5 MG tablet TAKE 1 AND 1/2 TABLETS(7.5 MG) BY MOUTH EVERY DAY 135 tablet 1  . buPROPion (WELLBUTRIN) 75 MG tablet Take 1 tablet (75 mg total) by mouth once daily 30 tablet 11  . calcium-vitamin D3-vitamin K [id number] mg-unit-mcg Chew Take by mouth once daily.  . cetirizine (ZYRTEC) 10 MG tablet Take 10-20 mg by mouth once daily as needed.  . cholecalciferol (CHOLECALCIFEROL) 1,000 unit tablet Take 1,000 Units by mouth once daily.  . diclofenac (VOLTAREN) 1 % topical gel Apply 2 g topically 4 (four) times daily  . docusate (COLACE) 100 MG capsule Take 100 mg by mouth 2 (two) times daily as needed for Constipation.  . DULoxetine (CYMBALTA) 60 MG DR capsule TAKE 1 CAPSULE(60 MG) BY MOUTH EVERY DAY 30 capsule 11  . dupilumab (DUPIXENT) 300 mg/2 mL pen injector Inject 2 mLs (300 mg total) subcutaneously every 14 (fourteen) days 4 mL 12  . EPINEPHrine (EPIPEN) 0.3 mg/0.3 mL auto-injector Inject 0.3 mg into the muscle once as needed  .  fluticasone (FLONASE) 50 mcg/actuation nasal spray Place 2 sprays into both nostrils once daily as needed. 16 g 3  . HYDROcodone-acetaminophen (NORCO) 5-325 mg tablet  . hydrocodone-chlorpheniramine (TUSSIONEX) 10-8 mg/5 mL ER suspension Take 5 mLs by mouth every 12 (twelve) hours as needed for Cough 300 mL 0  . meclizine (ANTIVERT) 12.5 mg tablet Take 1-2 tablets 3 times a day as needed. 30 tablet 1  . methyl salicylate-menthol 94-7 % Crea Apply topically as needed  . montelukast (SINGULAIR) 10 mg tablet Take 1 tablet (10 mg total) by mouth nightly 30 tablet 11  . polyethylene glycol (MIRALAX) packet Take 17 g by mouth as needed for Constipation. Mix in 4-8ounces of fluid prior to taking.  . budesonide-formoteroL (SYMBICORT) 160-4.5 mcg/actuation inhaler Inhale 2 inhalations into the lungs 2 (two) times daily 1 Inhaler 10   Allergies:  . Bisacodyl Other (See Comments)  Extreme cramping.  . Ciprofloxacin Muscle Pain  . Penicillins Unknown  . Sulfa (Sulfonamide Antibiotics) Unknown   Past Medical History:  . Allergy Seasonal  . Anxiety Occasionally  . Asthma without status asthmaticus, unspecified Allergy induced, seasonal  . Cancer (CMS-HCC) Skin cancer removed from back  . COPD (chronic obstructive pulmonary disease) (CMS-HCC) Seasonal  . De Quervain's tenosynovitis  . Emphysema of lung (CMS-HCC)  . Encounter for blood transfusion 50+ years ago  . GERD (gastroesophageal reflux disease) Occasionally  . Hypertension  . Migraines  . Mild renal insufficiency  . Mixed anxiety and depressive disorder  .  OA (osteoarthritis)  . Osteopenia on Fosamax for several years, stopped 2012  . Painless hematuria   Past Surgical History:  . CERVICAL DISCECTOMY AND FUSION 2018 Dr. Cyndy Freeze  . COLONOSCOPY 10/16/2016 Hyperplastic colon polyp/Repeat 68yrs if desired/MUS  . ENDOSCOPIC CARPAL TUNNEL RELEASE Bilateral  . HYSTERECTOMY and oophorectomy  . Right unicondylar knee arthroplasty Right 05/26/2019  Dr.Maurico Perrell  . TONSILLECTOMY 50 years ago   Family History:  Marland Kitchen Myocardial Infarction (Heart attack) Father  . Heart disease Father  . High blood pressure (Hypertension) Father  . Stroke Father  . Coronary Artery Disease (Blocked arteries around heart) Father  All of his brothers had this  . High blood pressure (Hypertension) Mother  . Osteoporosis (Thinning of bones) Mother  . Alzheimer's disease Mother  . Glaucoma Mother  . Hip fracture Mother  . Osteoarthritis Mother  . Alcohol abuse Paternal Uncle  . Alcohol abuse Paternal Uncle  . Bipolar disorder Sister  . Diabetes type II Daughter  . Obesity Daughter  . Obesity Son  . Skin cancer Sister   Social History:   Socioeconomic History:  Marland Kitchen Marital status: Married  Spouse name: Meenakshi Sazama  . Number of children: 2  . Years of education: 105  . Highest education level: High school graduate  Occupational History  . Occupation: Retired  Tobacco Use  . Smoking status: Never Smoker  . Smokeless tobacco: Never Used  Vaping Use  . Vaping Use: Never used  Substance and Sexual Activity  . Alcohol use: Never  Alcohol/week: 0.0 standard drinks  . Drug use: No  . Sexual activity: Yes  Partners: Male  Other Topics Concern  . Not on file  Social History Narrative  . Not on file   Social Determinants of Health:   Financial Resource Strain: Not on file  Food Insecurity: Not on file  Transportation Needs: Not on file   Review of Systems:  A comprehensive 14 point ROS was performed, reviewed, and the pertinent orthopaedic findings are documented in the HPI.  Exam: Vitals:  11/30/19 1015  BP: 122/74  Weight: 66.2 kg (146 lb)  Height: 152.4 cm (5')  PainSc: 0-No pain  PainLoc: Knee   General/Constitutional: The patient appears to be well-nourished, well-developed, and in no acute distress. Neuro/Psych: Normal mood and affect, oriented to person, place and time. Eyes: Non-icteric. Pupils are equal, round, and reactive to  light, and exhibit synchronous movement. ENT: Unremarkable. Lymphatic: No palpable adenopathy. Respiratory: Lungs clear to auscultation, Normal chest excursion, No wheezes and Non-labored breathing Cardiovascular: Regular rate and rhythm. No murmurs. and No edema, swelling or tenderness, except as noted in detailed exam. Integumentary: No impressive skin lesions present, except as noted in detailed exam. Musculoskeletal: Unremarkable, except as noted in detailed exam.  Left wrist exam: The patient is in a sugar tong splint supporting her forearm and wrist. The splint appears to be in good condition and fitting well. Her skin is intact at the proximal and distal margins of the splint. She is able active flex extend all digits without any pain or triggering. She is neurovascularly intact to all digits.  X-rays/MRI/Lab data:  Recent outside films of the left wrist are available for review and have been reviewed by myself. These films demonstrate a dorsally impacted intra-articular fracture of the left distal radius. There is approximately 15 to 20 degrees of dorsal tilt, as well as loss of radial inclination. There also is 1 to 2 mm of radial shortening in relation to the ulna. No significant  degenerative changes of the radiocarpal joint are noted, although she does have at least moderate degenerative changes of the thumb CMC joint. No lytic lesions or other acute bony abnormalities are identified.  Assessment: Closed Colles' fracture of left radius.  Plan: The treatment options were discussed with the patient and her daughter. In addition, patient educational materials were provided regarding the diagnosis and treatment options. Regarding her left wrist injury, the patient is frustrated by her pain and is anxious to optimize her functional recovery. Therefore, I have recommended a surgical procedure, specifically an open reduction and internal fixation of the left distal radius fracture. The  procedure was discussed with the patient, as were the potential risks (including bleeding, infection, nerve and/or blood vessel injury, persistent or recurrent pain, loosening and/or failure of the fixation device, malunion, non-union, need for further surgery, blood clots, strokes, heart attacks and/or arhythmias, pneumonia, etc.) and benefits. The patient states her understanding and wishes to proceed. All of the patient's questions and concerns were answered. She can call any time with further concerns. She will follow up post-surgery, routine.   H&P reviewed and patient re-examined. No changes.

## 2019-12-02 NOTE — Transfer of Care (Signed)
Immediate Anesthesia Transfer of Care Note  Patient: Christine Ballard  Procedure(s) Performed: OPEN REDUCTION INTERNAL FIXATION (ORIF) WRIST FRACTURE (Left Wrist)  Patient Location: PACU  Anesthesia Type:General  Level of Consciousness: sedated  Airway & Oxygen Therapy: Patient Spontanous Breathing and Patient connected to face mask oxygen  Post-op Assessment: Report given to RN and Post -op Vital signs reviewed and stable  Post vital signs: Reviewed  Last Vitals:  Vitals Value Taken Time  BP    Temp    Pulse    Resp    SpO2      Last Pain:  Vitals:   12/02/19 1221  TempSrc: Tympanic  PainSc: 0-No pain         Complications: No complications documented.

## 2019-12-02 NOTE — Anesthesia Procedure Notes (Signed)
Procedure Name: LMA Insertion Performed by: Paitynn Mikus, CRNA Pre-anesthesia Checklist: Patient identified, Patient being monitored, Timeout performed, Emergency Drugs available and Suction available Patient Re-evaluated:Patient Re-evaluated prior to induction Oxygen Delivery Method: Circle system utilized Preoxygenation: Pre-oxygenation with 100% oxygen Induction Type: IV induction Ventilation: Mask ventilation without difficulty LMA: LMA inserted LMA Size: 4.0 Tube type: Oral Number of attempts: 1 Placement Confirmation: positive ETCO2 and breath sounds checked- equal and bilateral Tube secured with: Tape Dental Injury: Teeth and Oropharynx as per pre-operative assessment        

## 2019-12-02 NOTE — Discharge Instructions (Addendum)
Orthopedic discharge instructions: Keep splint dry and intact. Keep hand elevated above heart level. Apply ice to affected area frequently. Take ibuprofen 600-800 mg TID with meals for 7-10 days, then as necessary. Take pain medication as prescribed or ES Tylenol when needed.  Return for follow-up in 10-14 days or as scheduled.     Interscalene Nerve Block (ISNB) Discharge Instructions   1.  For your surgery you have received an Interscalene Nerve Block. 2. Nerve Blocks affect many types of nerves, including nerves that control movement, pain and normal sensation.  You may experience feelings such as numbness, tingling, heaviness, weakness or the inability to move your arm or the feeling or sensation that your arm has "fallen asleep". 3. A nerve block can last for 2 - 36 hours or more depending on the medication used.  Usually the weakness wears off first.  The tingling and heaviness usually wear off next.  Finally you may start to notice pain.  Keep in mind that this may occur in any order.  once a nerve block starts to wear off it is usually completely gone within 60 minutes. 4. ISNB may cause mild shortness of breath, a hoarse voice, blurry vision, unequal pupils, or drooping of the face on the same side as the nerve block.  These symptoms will usually go away within 12 hours.  Very rarely the procedure itself can cause mild seizures. 5. If needed, your surgeon will give you a prescription for pain medication.  It will take about 60 minutes for the oral pain medication to become fully effective.  So, it is recommended that you start taking this medication before the nerve block first begins to wear off, or when you first begin to feel discomfort. 6. Keep in mind that nerve blocks often wear off in the middle of the night.   If you are going to bed and the block has not started to wear off or you have not started to have any discomfort, consider setting an alarm for 2 to 3 hours, so you can assess  your block.  If you notice the block is wearing off or you are starting to have discomfort, you can take your pain medication. 7. Take your pain medication only as prescribed.  Pain medication can cause sedation and decrease your breathing if you take more than you need for the level of pain that you have. 8. Nausea is a common side effect of many pain medications.  You may want to eat something before taking your pain medicine to prevent nausea. 9. After an Interscalene nerve block, you cannot feel pain, pressure or extremes in temperature in the effected arm.  Because your arm is numb it is at an increased risk for injury.  To decrease the possibility of injury, please practice the following:  a. While you are awake change the position of your arm frequently to prevent too much pressure on any one area for prolonged periods of time. b.  If you have a cast or tight dressing, check the color or your fingers every couple of hours.  Call your surgeon with the appearance of any discoloration (white or blue). c. If you are given a sling to wear before you go home, please wear it  at all times until the block has completely worn off.  Do not get up at night without your sling. d. If you experience any problems or concerns, please contact your surgeon's office. If you experience severe or prolonged shortness of breath  go to the nearest emergency department.AMBULATORY SURGERY  DISCHARGE INSTRUCTIONS   1) The drugs that you were given will stay in your system until tomorrow so for the next 24 hours you should not:  A) Drive an automobile B) Make any legal decisions C) Drink any alcoholic beverage   2) You may resume regular meals tomorrow.  Today it is better to start with liquids and gradually work up to solid foods.  You may eat anything you prefer, but it is better to start with liquids, then soup and crackers, and gradually work up to solid foods.   3) Please notify your doctor immediately if  you have any unusual bleeding, trouble breathing, redness and pain at the surgery site, drainage, fever, or pain not relieved by medication.    4) Additional Instructions:        Please contact your physician with any problems or Same Day Surgery at [phone], Monday through Friday 6 am to 4 pm, or [place] at [facility] number at [phone].

## 2019-12-02 NOTE — Op Note (Signed)
12/02/2019  3:11 PM  Patient:   Christine Ballard  Pre-Op Diagnosis:   Closed acute intra-articular distal radius fracture, left wrist.  Post-Op Diagnosis:   Same.  Procedure:   Open reduction and internal fixation of left distal radius fracture.  Surgeon:   Pascal Lux, MD  Assistant:   None  Anesthesia:   General LMA  Findings:   As above.  Complications:   None  EBL:   10 cc  Fluids:   500 cc crystalloid  TT:   59 minutes at 250 mmHg  Drains:   None  Closure:   3-0 Vicryl subcuticular sutures  Implants:   Biomet DVR Cross-locked narrow precontoured distal radius mini-locking plate.  Brief Clinical Note:   The patient is a 79 year old female who sustained the above-noted injury 4 days ago when she apparently tripped and fell onto her outstretched left hand. She presented to a local urgent care facility where x-rays confirmed the above-noted injury. She presents at this time for definitive management of her injury.  Procedure:   The patient was brought into the operating room and lain in the supine position. After adequate general laryngal mask anesthesia was obtained, the patient's left hand and upper extremity were prepped with ChloraPrep solution before being draped sterilely. Preoperative antibiotics were administered. A timeout was performed to verify the appropriate surgical site before the limb was exsanguinated with an Esmarch and the tourniquet inflated to 250 mmHg. An approximately 7-8 cm incision was made over the volar aspect of the distal radius beginning at the volar flexion crease and extending proximally along the flexor carpi radialis tendon. The incision was carried down through the subcutaneous tissues to expose the superficial retinaculum. This was split the length of the incision directly over the flexor carpi radialis tendon. The FCR sheath was opened and the tendon retracted ulnarly to protect the median nerve. The floor of the FCR sheath was opened to  expose the pronator quadratus muscle. This was released along the radial insertion and the muscle was retracted ulnarly to expose the distal radius.   The fracture was identified and soft tissues elevated off the distal metaphyseal region for several centimeters. The appropriate sized plate was selected and positioned on the distal radius. A guidewire was placed through the distal hole and its position verified using FluoroScan imaging in AP and lateral projections. After several attempts, the pin was positioned parallel to the distal articular surface and approximately 3-4 mm proximal to the articular surface. Again the position of the plate was verified using FluoroScan imaging in AP and lateral projections and found to be excellent. Distally, a nonlocking cortical screw was placed in the ulnar-most hole of the more proximal row. The other holes were filled with locking pegs of the appropriate lengths. The plate was carefully lowered onto the volar metaphyseal surface, reducing the fracture in the process. The adequacy of hardware position and fracture reduction was verified using FluoroScan imaging in AP, lateral, and several additional oblique projections to be sure that the hardware did not enter the joint nor did it penetrate dorsally.  Proximally the plate was secured to the radial shaft using two bicortical nonlocking screws followed by two locking cortical screws. Again the construct was assessed using FluoroScan imaging in AP, lateral, and oblique projections and found to be excellent.  The wound was copiously irrigated with sterile saline solution before the pronator quadratus was reapproximated using 2-0 Vicryl interrupted sutures. The subcutaneous tissues were closed using 2-0 Vicryl interrupted  sutures before the subcuticular layer was closed using 3-0 Vicryl inverted interrupted sutures. Benzoin and Steri-Strips are applied to the skin. A total of 10 cc of 0.5% plain Sensorcaine was injected in  and around the incision site to help with postoperative analgesia before a sterile bulky dressing was applied to the wound. The patient was placed into a volar splint maintaining the wrist in neutral position before the patient was awakened, extubated, and returned to the recovery room in satisfactory condition after tolerating the procedure well.

## 2019-12-02 NOTE — Anesthesia Preprocedure Evaluation (Addendum)
Anesthesia Evaluation  Patient identified by MRN, date of birth, ID band Patient awake    Reviewed: Allergy & Precautions, NPO status , Patient's Chart, lab work & pertinent test results  History of Anesthesia Complications Negative for: history of anesthetic complications  Airway Mallampati: III  TM Distance: >3 FB Neck ROM: Full    Dental  (+) Dental Advidsory Given   Pulmonary neg shortness of breath, asthma , neg sleep apnea, COPD,  COPD inhaler, neg recent URI,    breath sounds clear to auscultation- rhonchi (-) wheezing      Cardiovascular hypertension, Pt. on medications (-) angina(-) CAD, (-) Past MI, (-) Cardiac Stents and (-) CABG  Rhythm:Regular Rate:Normal - Systolic murmurs and - Diastolic murmurs    Neuro/Psych  Headaches, neg Seizures PSYCHIATRIC DISORDERS Anxiety Depression    GI/Hepatic Neg liver ROS, GERD  ,  Endo/Other  negative endocrine ROSneg diabetes  Renal/GU CRFRenal disease     Musculoskeletal  (+) Arthritis ,   Abdominal (+) - obese,   Peds  Hematology negative hematology ROS (+)   Anesthesia Other Findings Past Medical History: No date: Allergic rhinitis No date: Allergy-induced asthma     Comment:  seasonal allergies No date: Anxiety No date: Arthritis     Comment:  chronic hip pain,right No date: Cancer ([place])     Comment:  skin on back No date: Cataracts, bilateral     Comment:  "small" No date: Chronic kidney disease     Comment:  mild renal insufficiency No date: Constipation No date: COPD (chronic obstructive pulmonary disease) (HCC) No date: Depression No date: Dyspnea No date: GERD (gastroesophageal reflux disease) No date: Headache     Comment:  occular No date: History of bronchitis     Comment:  "about twice a year" No date: History of hematuria No date: Hypertension No date: Hypoglycemia     Comment:  distant past No date: Osteopenia No date: Osteopenia No  date: Torn meniscus     Comment:  bilateral No date: Vertigo     Comment:  2-3x/yr No date: Wears glasses   Reproductive/Obstetrics                             Lab Results  Component Value Date   WBC 7.2 05/27/2019   HGB 9.9 (L) 05/27/2019   HCT 31.0 (L) 05/27/2019   MCV 89.3 05/27/2019   PLT 231 05/27/2019    Anesthesia Physical  Anesthesia Plan  ASA: III  Anesthesia Plan: General   Post-op Pain Management:  Regional for Post-op pain   Induction: Intravenous  PONV Risk Score and Plan: 2 and Ondansetron and Dexamethasone  Airway Management Planned: LMA  Additional Equipment:   Intra-op Plan:   Post-operative Plan: Extubation in OR  Informed Consent: I have reviewed the patients History and Physical, chart, labs and discussed the procedure including the risks, benefits and alternatives for the proposed anesthesia with the patient or authorized representative who has indicated his/her understanding and acceptance.     Dental advisory given  Plan Discussed with: CRNA and Anesthesiologist  Anesthesia Plan Comments:        Anesthesia Quick Evaluation

## 2019-12-02 NOTE — Anesthesia Procedure Notes (Signed)
Anesthesia Regional Block: Supraclavicular block   Pre-Anesthetic Checklist: ,, timeout performed, Correct Patient, Correct Site, Correct Laterality, Correct Procedure, Correct Position, site marked, Risks and benefits discussed,  Surgical consent,  Pre-op evaluation,  At surgeon's request and post-op pain management  Laterality: Left and Upper  Prep: chloraprep       Needles:  Injection technique: Single-shot  Needle Type: Stimiplex     Needle Length: 5cm  Needle Gauge: 22     Additional Needles:   Procedures:,,,, ultrasound used (permanent image in chart),,,,  Narrative:  Start time: 12/02/2019 4:38 PM End time: 12/02/2019 4:42 PM Injection made incrementally with aspirations every 5 mL.  Performed by: Personally  Anesthesiologist: Martha Clan, MD  Additional Notes: Functioning IV was confirmed and monitors were applied.  A 88mm 22ga Stimuplex needle was used. Sterile prep and drape,hand hygiene and sterile gloves were used.  Negative aspiration and negative test dose prior to incremental administration of local anesthetic. The patient tolerated the procedure well.

## 2019-12-03 NOTE — Anesthesia Postprocedure Evaluation (Signed)
Anesthesia Post Note  Patient: Elon Spanner Keziah  Procedure(s) Performed: OPEN REDUCTION INTERNAL FIXATION (ORIF) WRIST FRACTURE (Left Wrist)  Patient location during evaluation: PACU Anesthesia Type: General Level of consciousness: awake and alert Pain management: pain level controlled Vital Signs Assessment: post-procedure vital signs reviewed and stable Respiratory status: spontaneous breathing, nonlabored ventilation, respiratory function stable and patient connected to nasal cannula oxygen Cardiovascular status: blood pressure returned to baseline and stable Postop Assessment: no apparent nausea or vomiting Anesthetic complications: no   No complications documented.   Last Vitals:  Vitals:   12/02/19 1656 12/02/19 1701  BP: 105/67 116/64  Pulse: 79 79  Resp: (!) 23 17  Temp: (!) 36.4 C 36.5 C  SpO2: 97% 93%    Last Pain:  Vitals:   12/02/19 1701  TempSrc: Temporal  PainSc: 2                  Martha Clan

## 2020-02-25 DIAGNOSIS — D649 Anemia, unspecified: Secondary | ICD-10-CM | POA: Insufficient documentation

## 2020-02-25 DIAGNOSIS — E538 Deficiency of other specified B group vitamins: Secondary | ICD-10-CM | POA: Insufficient documentation

## 2020-03-07 ENCOUNTER — Other Ambulatory Visit: Payer: Self-pay

## 2020-03-07 ENCOUNTER — Inpatient Hospital Stay: Payer: Medicare PPO | Attending: Internal Medicine | Admitting: Internal Medicine

## 2020-03-07 ENCOUNTER — Inpatient Hospital Stay: Payer: Medicare PPO | Admitting: *Deleted

## 2020-03-07 ENCOUNTER — Encounter: Payer: Self-pay | Admitting: Internal Medicine

## 2020-03-07 VITALS — BP 137/70 | HR 82 | Temp 98.2°F | Resp 16 | Wt 144.4 lb

## 2020-03-07 DIAGNOSIS — E611 Iron deficiency: Secondary | ICD-10-CM

## 2020-03-07 DIAGNOSIS — R238 Other skin changes: Secondary | ICD-10-CM

## 2020-03-07 DIAGNOSIS — R233 Spontaneous ecchymoses: Secondary | ICD-10-CM | POA: Insufficient documentation

## 2020-03-07 LAB — CBC WITH DIFFERENTIAL/PLATELET
Abs Immature Granulocytes: 0.05 10*3/uL (ref 0.00–0.07)
Basophils Absolute: 0.1 10*3/uL (ref 0.0–0.1)
Basophils Relative: 1 %
Eosinophils Absolute: 0.2 10*3/uL (ref 0.0–0.5)
Eosinophils Relative: 2 %
HCT: 31.5 % — ABNORMAL LOW (ref 36.0–46.0)
Hemoglobin: 9.6 g/dL — ABNORMAL LOW (ref 12.0–15.0)
Immature Granulocytes: 1 %
Lymphocytes Relative: 26 %
Lymphs Abs: 2.1 10*3/uL (ref 0.7–4.0)
MCH: 24.1 pg — ABNORMAL LOW (ref 26.0–34.0)
MCHC: 30.5 g/dL (ref 30.0–36.0)
MCV: 79.1 fL — ABNORMAL LOW (ref 80.0–100.0)
Monocytes Absolute: 0.7 10*3/uL (ref 0.1–1.0)
Monocytes Relative: 9 %
Neutro Abs: 5.1 10*3/uL (ref 1.7–7.7)
Neutrophils Relative %: 61 %
Platelets: 300 10*3/uL (ref 150–400)
RBC: 3.98 MIL/uL (ref 3.87–5.11)
RDW: 16.1 % — ABNORMAL HIGH (ref 11.5–15.5)
WBC: 8.2 10*3/uL (ref 4.0–10.5)
nRBC: 0 % (ref 0.0–0.2)

## 2020-03-07 LAB — PROTIME-INR
INR: 1 (ref 0.8–1.2)
Prothrombin Time: 13 seconds (ref 11.4–15.2)

## 2020-03-07 LAB — URINALYSIS, COMPLETE (UACMP) WITH MICROSCOPIC
Bacteria, UA: NONE SEEN
Bilirubin Urine: NEGATIVE
Glucose, UA: NEGATIVE mg/dL
Ketones, ur: NEGATIVE mg/dL
Nitrite: NEGATIVE
Protein, ur: NEGATIVE mg/dL
Specific Gravity, Urine: 1.02 (ref 1.005–1.030)
pH: 5 (ref 5.0–8.0)

## 2020-03-07 LAB — APTT: aPTT: 30 seconds (ref 24–36)

## 2020-03-07 LAB — LACTATE DEHYDROGENASE: LDH: 130 U/L (ref 98–192)

## 2020-03-07 LAB — TECHNOLOGIST SMEAR REVIEW: Plt Morphology: ADEQUATE

## 2020-03-07 LAB — RETICULOCYTES
Immature Retic Fract: 19.7 % — ABNORMAL HIGH (ref 2.3–15.9)
RBC.: 3.92 MIL/uL (ref 3.87–5.11)
Retic Count, Absolute: 69.4 10*3/uL (ref 19.0–186.0)
Retic Ct Pct: 1.8 % (ref 0.4–3.1)

## 2020-03-07 NOTE — Progress Notes (Signed)
Patient has been receiving b12 injection at Dr. Blane Ohara office. Patient has not had any oral iron tablets since last Thursday due to indigestion.

## 2020-03-07 NOTE — Assessment & Plan Note (Addendum)
#  Iron deficient anemia-hemoglobin ~9/ferritin-7 [FEB 2022; PCP]; patient symptomatic.  Poor tolerance to p.o. iron.  Check labs today.  Discussed regarding IV iron infusion.  Discussed the potential acute infusion reactions with IV iron; which are quite rare.  Patient understands the risk versus benefits; proceed with Venofer.  # B12 low- shot once a week; pills once a day [PCP].  Etiology unclear check antiparietal/antiintrinsic antibodies.  # Easy bruising: Patient not on any anticoagulation/antiplatelet therapy.  Check PT PTT.  # Etiology: Of iron deficient is unclear.  Stool occult-negative [PCP].  However still discussed importance of GI evaluation.  Patient reluctant.  # Dizzness: Multifactorial-anemia/possible vertigo.  Monitor for now.  Thank you Dr. Ola Spurr for allowing me to participate in the care of your pleasant patient. Please do not hesitate to contact me with questions or concerns in the interim.  The above plan of care was discussed the patient and her daughter in detail.  # DISPOSITION: # labs- Today- cbc/cmp; LDH; retic; UA; anti-parietal ant/ PT/PTT.  # venofer weekly x4-start this week/ next week # Follow up in 6 weeks- labs- cbc/possible venofer- Dr.B

## 2020-03-07 NOTE — Progress Notes (Signed)
[place] NOTE  Patient Care Team: Ebbie Ridge, MD as PCP - General (Cardiology)  CHIEF COMPLAINTS/PURPOSE OF CONSULTATION: ANEMIA   HEMATOLOGY HISTORY:  # MICROCYTIC ANEMIA [FEB 2022- Hb 9.6; Ferritin-6] > 25 years Hx of hematuria [[place]; urology]-cystoscopy;  EGD-none colonoscopy-Oct 2018; polyps ; capsule-Bone marrow Biopsy-NONE  HISTORY OF PRESENTING ILLNESS:  Christine Ballard 79 y.o.  female has been referred to [place] for further evaluation/work-up for anemia.  Patient was recently evaluated PCP for worsening fatigue/dizziness.  Work-up showed hemoglobin of 9.6; ferritin 6. Patient was started on p.o. iron 1 week ago however stopped secondary to abdominal pain.  Patient complains of intermittent blood in stools which she attributes to hemorrhoids.  This is not frequent/well.  Of note patient had hemoglobin around 9 in May 2021 patient had orthopedic surgery done.   Blood in stools: ? hemorrhoidal with diarrhea/ wiping; rare Change in bowel habits- none Blood in urine: None Difficulty swallowing: None Abnormal weight loss: None Iron supplementation: PO iron x 2 weeks- poor tolerance.  Prior Blood transfusions: none Bariatric surgery: None Vaginal bleeding: None  Review of Systems  Constitutional: Positive for malaise/fatigue. Negative for chills, diaphoresis, fever and weight loss.  HENT: Negative for nosebleeds and sore throat.   Eyes: Negative for double vision.  Respiratory: Negative for cough, hemoptysis, sputum production, shortness of breath and wheezing.   Cardiovascular: Negative for chest pain, palpitations, orthopnea and leg swelling.  Gastrointestinal: Positive for nausea. Negative for abdominal pain, blood in stool, constipation, heartburn, melena and vomiting.  Genitourinary: Negative for dysuria, frequency and urgency.  Musculoskeletal: Positive for back pain and joint pain.  Skin: Negative.  Negative for itching and rash.   Neurological: Positive for dizziness. Negative for tingling, focal weakness, weakness and headaches.  Endo/Heme/Allergies: Bruises/bleeds easily.  Psychiatric/Behavioral: Negative for depression. The patient is not nervous/anxious and does not have insomnia.     MEDICAL HISTORY:  Past Medical History:  Diagnosis Date  . Allergic rhinitis   . Allergy-induced asthma    seasonal allergies  . Anxiety   . Arthritis    chronic hip pain,right  . Cancer ([place])    skin on back  . Cataracts, bilateral    "small"  . Chronic kidney disease    mild renal insufficiency  . Constipation   . COPD (chronic obstructive pulmonary disease) ([place])   . Depression   . Dyspnea   . GERD (gastroesophageal reflux disease)   . Headache    occular  . History of bronchitis    "about twice a year"  . History of hematuria   . Hypertension   . Hypoglycemia    distant past  . Osteopenia   . Osteopenia   . Torn meniscus    bilateral  . Vertigo    2-3x/yr  . Wears glasses     SURGICAL HISTORY: Past Surgical History:  Procedure Laterality Date  . ABDOMINAL HYSTERECTOMY    . ANTERIOR CERVICAL DECOMP/DISCECTOMY FUSION N/A 06/26/2016   Procedure: Cervical Two-Three, Cervical Three-Four, Cervical Four-Five, Cervical Five-Six Anterior Discectomy with Fusion and Plate Fixation;  Surgeon: Ditty, Kevan Ny, MD;  Location: [place];  Service: Neurosurgery;  Laterality: N/A;  C3-6 Anterior discectomy with fusion and plate fixation  . BASAL CELL CARCINOMA EXCISION     on back  . BLEPHAROPLASTY    . CARPAL TUNNEL RELEASE Bilateral   . CATARACT EXTRACTION W/PHACO Right 02/03/2019   Procedure: CATARACT EXTRACTION PHACO AND INTRAOCULAR LENS PLACEMENT (IOC) RIGHT 5.39 00:38.1;  Surgeon: Birder Robson, MD;  Location: [place];  Service: Ophthalmology;  Laterality: Right;  . CATARACT EXTRACTION W/PHACO Left 02/24/2019   Procedure: CATARACT EXTRACTION PHACO AND INTRAOCULAR LENS PLACEMENT ([place])  LEFT;   Surgeon: Birder Robson, MD;  Location: [facility];  Service: Ophthalmology;  Laterality: Left;  CDE 4.86 U/S 0:26.1  . COLONOSCOPY    . COLONOSCOPY WITH PROPOFOL N/A 10/16/2016   Procedure: COLONOSCOPY WITH PROPOFOL;  Surgeon: Lollie Sails, MD;  Location: [facility];  Service: Endoscopy;  Laterality: N/A;  . EYE SURGERY    . NASAL SINUS SURGERY    . ORIF WRIST FRACTURE Left 12/02/2019   Procedure: OPEN REDUCTION INTERNAL FIXATION (ORIF) WRIST FRACTURE;  Surgeon: Corky Mull, MD;  Location: [facility];  Service: Orthopedics;  Laterality: Left;  . PARTIAL KNEE ARTHROPLASTY Right 05/26/2019   Procedure: UNICOMPARTMENTAL KNEE;  Surgeon: Corky Mull, MD;  Location: [facility];  Service: Orthopedics;  Laterality: Right;  . REPAIR EXTENSOR TENDON Left 07/10/2018   Procedure: REPAIR EXTENSOR TENDON LEFT LONG FINGER REALIGNMENT;  Surgeon: Hessie Knows, MD;  Location: [facility];  Service: Orthopedics;  Laterality: Left;  . TONSILLECTOMY      SOCIAL HISTORY: Social History   Socioeconomic History  . Marital status: Married    Spouse name: Chrissie Noa  . Number of children: Not on file  . Years of education: Not on file  . Highest education level: Not on file  Occupational History  . Not on file  Tobacco Use  . Smoking status: Never Smoker  . Smokeless tobacco: Never Used  Vaping Use  . Vaping Use: Never used  Substance and Sexual Activity  . Alcohol use: No  . Drug use: No  . Sexual activity: Not on file  Other Topics Concern  . Not on file  Social History Narrative   Lives in [place] with husband. Daughter in [place]. No alcohol. Never smoked. Worked at [employer].    Social Determinants of Health   Financial Resource Strain: Not on file  Food Insecurity: Not on file  Transportation Needs: Not on file  Physical Activity: Not on file  Stress: Not on file  Social Connections: Not on file  Intimate Partner Violence: Not on  file    FAMILY HISTORY: Family History  Problem Relation Age of Onset  . Breast cancer Maternal Aunt   . Breast cancer Cousin     ALLERGIES:  is allergic to bisacodyl, ciprofloxacin, procaine, penicillins, and sulfa antibiotics.  MEDICATIONS:  Current Outpatient Medications  Medication Sig Dispense Refill  . acetaminophen (TYLENOL) 650 MG CR tablet Take 650 mg by mouth See admin instructions. Take 1 tablet (650 mg) by mouth scheduled twice daily, may take an additional dose at mid afternoon if needed for pain.    Marland Kitchen amLODipine (NORVASC) 5 MG tablet Take 5 mg by mouth daily.    . Aromatic Inhalants (VICKS BABYRUB) OINT Apply 1 application topically.     Marland Kitchen buPROPion (WELLBUTRIN) 75 MG tablet Take 75 mg by mouth daily.    . Calcium Carb-Cholecalciferol (CALCIUM + D3 PO) Take 1 tablet by mouth daily.    . cetirizine (ZYRTEC) 10 MG tablet Take 10 mg by mouth daily as needed for allergies.    . cyanocobalamin (,VITAMIN B-12,) 1000 MCG/ML injection Inject into the muscle.    . docusate sodium (COLACE) 100 MG capsule Take 100-200 mg by mouth daily as needed for mild constipation.     . DULoxetine (CYMBALTA)  60 MG capsule Take 60 mg by mouth daily.     . Dupilumab 300 MG/2ML SOPN Inject 300 mg into the skin every 14 (fourteen) days. FRIDAYS    . ferrous sulfate 325 (65 FE) MG tablet Take 1 tablet by mouth daily with breakfast.    . fluticasone (FLONASE) 50 MCG/ACT nasal spray Place 1 spray into the nose daily as needed for allergies.     Marland Kitchen meclizine (ANTIVERT) 12.5 MG tablet Take 12.5 mg by mouth 3 (three) times daily as needed for dizziness (vertigo).     . Menthol-Methyl Salicylate (MUSCLE RUB) 10-15 % CREA Apply 1 application topically 3 (three) times daily as needed (knee pain/arthritis.).     Marland Kitchen montelukast (SINGULAIR) 10 MG tablet Take 10 mg by mouth at bedtime.    Marland Kitchen omeprazole (PRILOSEC) 40 MG capsule Take 40 mg by mouth every evening.     . polyethylene glycol (MIRALAX / GLYCOLAX) 17 g  packet Take 17 g by mouth daily.    Marland Kitchen VITAMIN D PO Take 1,000 Int'l Units/day by mouth daily.    . clindamycin (CLEOCIN) 150 MG capsule Prior to dental work (Patient not taking: Reported on 03/07/2020)     No current facility-administered medications for this visit.      PHYSICAL EXAMINATION:   Vitals:   03/07/20 1420  BP: 137/70  Pulse: 82  Resp: 16  Temp: 98.2 F (36.8 C)  SpO2: 99%   Filed Weights   03/07/20 1420  Weight: 144 lb 6.4 oz (65.5 kg)    Physical Exam Constitutional:      Comments: Patient ambulating independently.  Accompanied by daughter.  HENT:     Head: Normocephalic and atraumatic.     Mouth/Throat:     Mouth: Oropharynx is clear and moist.     Pharynx: No oropharyngeal exudate.  Eyes:     Pupils: Pupils are equal, round, and reactive to light.  Cardiovascular:     Rate and Rhythm: Normal rate and regular rhythm.  Pulmonary:     Effort: Pulmonary effort is normal. No respiratory distress.     Breath sounds: Normal breath sounds. No wheezing.  Abdominal:     General: Bowel sounds are normal. There is no distension.     Palpations: Abdomen is soft. There is no mass.     Tenderness: There is no abdominal tenderness. There is no guarding or rebound.  Musculoskeletal:        General: No tenderness or edema. Normal range of motion.     Cervical back: Normal range of motion and neck supple.  Skin:    General: Skin is warm.  Neurological:     Mental Status: She is alert and oriented to person, place, and time.  Psychiatric:        Mood and Affect: Affect normal.     LABORATORY DATA:  I have reviewed the data as listed Lab Results  Component Value Date   WBC 8.2 03/07/2020   HGB 9.6 (L) 03/07/2020   HCT 31.5 (L) 03/07/2020   MCV 79.1 (L) 03/07/2020   PLT 300 03/07/2020   Recent Labs    05/22/19 0826 05/26/19 1611 05/27/19 0403  NA 140 142 140  K 3.8 4.2 4.0  CL 104 107 108  CO2 _0 GLUCOSE 108* 109* 106*  BUN _1 CREATININE 0.88 0.79 0.75  CALCIUM 9.2 9.1 8.5*  GFRNONAA >60 >60 >60  GFRAA >60 >60 >60  PROT 7.6 7.0  --  ALBUMIN 4.4 3.9  --   AST 17 16  --   ALT 15 16  --   ALKPHOS 71 69  --   BILITOT 0.9 0.7  --      No results found.  Iron deficiency #Iron deficient anemia-hemoglobin ~9/ferritin-7 [FEB 2022; PCP]; patient symptomatic.  Poor tolerance to p.o. iron.  Check labs today.  Discussed regarding IV iron infusion.  Discussed the potential acute infusion reactions with IV iron; which are quite rare.  Patient understands the risk versus benefits; proceed with Venofer.  # B12 low- shot once a week; pills once a day [PCP].  Etiology unclear check antiparietal/antiintrinsic antibodies.  # Easy bruising: Patient not on any anticoagulation/antiplatelet therapy.  Check PT PTT.  # Etiology: Of iron deficient is unclear.  Stool occult-negative [PCP].  However still discussed importance of GI evaluation.  Patient reluctant.  # Dizzness: Multifactorial-anemia/possible vertigo.  Monitor for now.  Thank you Dr. Ola Spurr for allowing me to participate in the care of your pleasant patient. Please do not hesitate to contact me with questions or concerns in the interim.  The above plan of care was discussed the patient and her daughter in detail.  # DISPOSITION: # labs- Today- cbc/cmp; LDH; retic; UA; anti-parietal ant/ PT/PTT.  # venofer weekly x4-start this week/ next week # Follow up in 6 weeks- labs- cbc/possible venofer- Dr.B  All questions were answered. The patient knows to call the clinic with any problems, questions or concerns.    Cammie Sickle, MD 03/08/2020 8:33 AM

## 2020-03-09 ENCOUNTER — Telehealth: Payer: Self-pay | Admitting: *Deleted

## 2020-03-09 DIAGNOSIS — E611 Iron deficiency: Secondary | ICD-10-CM

## 2020-03-09 LAB — ANTI-PARIETAL ANTIBODY: Parietal Cell Antibody-IgG: 11.5 Units (ref 0.0–20.0)

## 2020-03-09 LAB — HAPTOGLOBIN: Haptoglobin: 224 mg/dL (ref 42–346)

## 2020-03-09 LAB — INTRINSIC FACTOR ANTIBODIES: Intrinsic Factor: 1 AU/mL (ref 0.0–1.1)

## 2020-03-09 NOTE — Telephone Encounter (Signed)
Spoke with patient. Patient is agreeable to apts at [facility].  Referral faxed to [facility]. Patient understands to keep all her other venofer apts as scheduled. She asked that I change her apts on 4/18 to 2/57 due to a conflict in apt times. She has an u/s schedule at the same time. apts times/dates moved per her request.

## 2020-03-09 NOTE — Telephone Encounter (Signed)
-----   Message from Cammie Sickle, MD sent at 03/09/2020  3:52 PM EST ----- H/T-please inform patient that her hemoglobin is 9.6-fairly similar to her blood work from her PCP office.  Recommend IV iron infusion as planned.  Also inform her that I would recommend gastroenterology evaluation to find out the reason for her anemia.  If patient is interested-make referral to [facility] clinic GI.   Thanks GB

## 2020-03-10 NOTE — Progress Notes (Signed)
Pt aware of plan of care

## 2020-03-15 ENCOUNTER — Inpatient Hospital Stay: Payer: Medicare PPO

## 2020-03-15 VITALS — BP 134/68 | HR 66 | Temp 96.8°F | Resp 20

## 2020-03-15 DIAGNOSIS — E611 Iron deficiency: Secondary | ICD-10-CM | POA: Diagnosis not present

## 2020-03-15 MED ORDER — SODIUM CHLORIDE 0.9 % IV SOLN: 200.0000 mg | Freq: Once | INTRAVENOUS | Status: DC

## 2020-03-15 MED ORDER — SODIUM CHLORIDE 0.9 % IV SOLN
Freq: Once | INTRAVENOUS | Status: AC
  Filled 2020-03-15: qty 250

## 2020-03-15 MED ORDER — IRON SUCROSE 20 MG/ML IV SOLN
200.0000 mg | Freq: Once | INTRAVENOUS | Status: AC
  Administered 2020-03-15: 200 mg via INTRAVENOUS
  Filled 2020-03-15: qty 10

## 2020-03-22 ENCOUNTER — Inpatient Hospital Stay: Payer: Medicare PPO

## 2020-03-22 VITALS — BP 133/72 | HR 73 | Temp 99.5°F | Resp 18

## 2020-03-22 DIAGNOSIS — E611 Iron deficiency: Secondary | ICD-10-CM

## 2020-03-22 MED ORDER — SODIUM CHLORIDE 0.9 % IV SOLN: 200.0000 mg | Freq: Once | INTRAVENOUS | Status: DC

## 2020-03-22 MED ORDER — SODIUM CHLORIDE 0.9 % IV SOLN
Freq: Once | INTRAVENOUS | Status: AC
  Filled 2020-03-22: qty 250

## 2020-03-22 MED ORDER — IRON SUCROSE 20 MG/ML IV SOLN
200.0000 mg | Freq: Once | INTRAVENOUS | Status: AC
  Administered 2020-03-22: 200 mg via INTRAVENOUS
  Filled 2020-03-22: qty 10

## 2020-03-29 ENCOUNTER — Inpatient Hospital Stay: Payer: Medicare PPO

## 2020-03-29 VITALS — BP 135/73 | HR 87 | Temp 97.5°F | Resp 20

## 2020-03-29 DIAGNOSIS — E611 Iron deficiency: Secondary | ICD-10-CM | POA: Diagnosis not present

## 2020-03-29 MED ORDER — IRON SUCROSE 20 MG/ML IV SOLN
200.0000 mg | Freq: Once | INTRAVENOUS | Status: AC
  Administered 2020-03-29: 200 mg via INTRAVENOUS
  Filled 2020-03-29: qty 10

## 2020-03-29 MED ORDER — SODIUM CHLORIDE 0.9 % IV SOLN: 200.0000 mg | Freq: Once | INTRAVENOUS | Status: DC

## 2020-03-29 MED ORDER — SODIUM CHLORIDE 0.9 % IV SOLN
Freq: Once | INTRAVENOUS | Status: AC
  Filled 2020-03-29: qty 250

## 2020-04-05 ENCOUNTER — Ambulatory Visit: Payer: Medicare PPO

## 2020-04-06 ENCOUNTER — Inpatient Hospital Stay: Payer: Medicare PPO | Attending: Internal Medicine

## 2020-04-06 VITALS — BP 142/68 | HR 82

## 2020-04-06 DIAGNOSIS — E538 Deficiency of other specified B group vitamins: Secondary | ICD-10-CM | POA: Diagnosis not present

## 2020-04-06 DIAGNOSIS — D509 Iron deficiency anemia, unspecified: Secondary | ICD-10-CM | POA: Diagnosis present

## 2020-04-06 DIAGNOSIS — E611 Iron deficiency: Secondary | ICD-10-CM

## 2020-04-06 MED ORDER — SODIUM CHLORIDE 0.9 % IV SOLN: 200.0000 mg | Freq: Once | INTRAVENOUS | Status: DC

## 2020-04-06 MED ORDER — IRON SUCROSE 20 MG/ML IV SOLN
200.0000 mg | Freq: Once | INTRAVENOUS | Status: AC
Start: 2020-04-06 — End: 2020-04-06
  Administered 2020-04-06: 200 mg via INTRAVENOUS
  Filled 2020-04-06: qty 10

## 2020-04-06 MED ORDER — SODIUM CHLORIDE 0.9 % IV SOLN
Freq: Once | INTRAVENOUS | Status: AC
  Filled 2020-04-06: qty 250

## 2020-04-18 ENCOUNTER — Ambulatory Visit: Payer: Medicare PPO | Admitting: Internal Medicine

## 2020-04-18 ENCOUNTER — Other Ambulatory Visit: Payer: Medicare PPO

## 2020-04-18 ENCOUNTER — Ambulatory Visit: Payer: Medicare PPO

## 2020-04-19 ENCOUNTER — Other Ambulatory Visit: Payer: Self-pay

## 2020-04-19 ENCOUNTER — Inpatient Hospital Stay: Payer: Medicare PPO

## 2020-04-19 ENCOUNTER — Encounter: Payer: Self-pay | Admitting: Internal Medicine

## 2020-04-19 ENCOUNTER — Inpatient Hospital Stay: Payer: Medicare PPO | Admitting: Internal Medicine

## 2020-04-19 VITALS — BP 133/72 | HR 71 | Resp 16

## 2020-04-19 DIAGNOSIS — E611 Iron deficiency: Secondary | ICD-10-CM

## 2020-04-19 DIAGNOSIS — R233 Spontaneous ecchymoses: Secondary | ICD-10-CM

## 2020-04-19 DIAGNOSIS — D509 Iron deficiency anemia, unspecified: Secondary | ICD-10-CM | POA: Diagnosis not present

## 2020-04-19 DIAGNOSIS — R238 Other skin changes: Secondary | ICD-10-CM

## 2020-04-19 LAB — CBC WITH DIFFERENTIAL/PLATELET
Abs Immature Granulocytes: 0.02 10*3/uL (ref 0.00–0.07)
Basophils Absolute: 0.1 10*3/uL (ref 0.0–0.1)
Basophils Relative: 1 %
Eosinophils Absolute: 0.1 10*3/uL (ref 0.0–0.5)
Eosinophils Relative: 2 %
HCT: 37.5 % (ref 36.0–46.0)
Hemoglobin: 11.8 g/dL — ABNORMAL LOW (ref 12.0–15.0)
Immature Granulocytes: 0 %
Lymphocytes Relative: 29 %
Lymphs Abs: 1.9 10*3/uL (ref 0.7–4.0)
MCH: 26.5 pg (ref 26.0–34.0)
MCHC: 31.5 g/dL (ref 30.0–36.0)
MCV: 84.3 fL (ref 80.0–100.0)
Monocytes Absolute: 0.7 10*3/uL (ref 0.1–1.0)
Monocytes Relative: 10 %
Neutro Abs: 3.9 10*3/uL (ref 1.7–7.7)
Neutrophils Relative %: 58 %
Platelets: 259 10*3/uL (ref 150–400)
RBC: 4.45 MIL/uL (ref 3.87–5.11)
RDW: 21.2 % — ABNORMAL HIGH (ref 11.5–15.5)
WBC: 6.7 10*3/uL (ref 4.0–10.5)
nRBC: 0 % (ref 0.0–0.2)

## 2020-04-19 MED ORDER — IRON SUCROSE 20 MG/ML IV SOLN
200.0000 mg | Freq: Once | INTRAVENOUS | Status: AC
  Administered 2020-04-19: 200 mg via INTRAVENOUS
  Filled 2020-04-19: qty 10

## 2020-04-19 MED ORDER — SODIUM CHLORIDE 0.9 % IV SOLN: 200.0000 mg | Freq: Once | INTRAVENOUS | Status: DC

## 2020-04-19 MED ORDER — SODIUM CHLORIDE 0.9 % IV SOLN
Freq: Once | INTRAVENOUS | Status: AC
Start: 2020-04-19 — End: 2020-04-19
  Filled 2020-04-19: qty 250

## 2020-04-19 NOTE — Assessment & Plan Note (Addendum)
#  Iron deficient anemia-hemoglobin ~9/ferritin-7 [FEB 2022; PCP]; patient symptomatic.  Poor tolerance to p.o. iron.  S/p 4 IV iron infusions.  # Today- Hb-11.8; proceed with IV iron infusion today.  Awaiting EGD/colonosopy- last weekend in May, 2022.   # B12 low- shot once a week; pills once a day [PCP].    # Easy bruising: wnl- PT PTT.  # DISPOSITION: # venofer today # Follow up in 3 months-MD; labs- cbc/possible venofer- Dr.B

## 2020-04-19 NOTE — Progress Notes (Signed)
[place] NOTE  Patient Care Team: Ebbie Ridge, MD as PCP - General (Cardiology) Cammie Sickle, MD as Consulting Physician (Internal Medicine) Efrain Sella, MD as Consulting Physician (Gastroenterology)  CHIEF COMPLAINTS/PURPOSE OF CONSULTATION: ANEMIA   HEMATOLOGY HISTORY:  # MICROCYTIC ANEMIA [FEB 2022- Hb 9.6; Ferritin-6] > 25 years Hx of hematuria [[place]; urology]-cystoscopy;  EGD-none;  colonoscopy-Oct 2018; polyps ; capsule-Bone marrow Biopsy-NONE  HISTORY OF PRESENTING ILLNESS:  Christine Ballard 80 y.o.  female iron deficient anemia is here for follow-up.  Patient s/p IV iron infusion noted to have improvement of her energy levels.  In the interim patient has been evaluated by GI-awaiting EGD/colonoscopy in the last week of May 2022.  Noted to have improvement of energy levels however not completely back at baseline.  Review of Systems  Constitutional: Positive for malaise/fatigue. Negative for chills, diaphoresis, fever and weight loss.  HENT: Negative for nosebleeds and sore throat.   Eyes: Negative for double vision.  Respiratory: Negative for cough, hemoptysis, sputum production, shortness of breath and wheezing.   Cardiovascular: Negative for chest pain, palpitations, orthopnea and leg swelling.  Gastrointestinal: Positive for nausea. Negative for abdominal pain, blood in stool, constipation, heartburn, melena and vomiting.  Genitourinary: Negative for dysuria, frequency and urgency.  Musculoskeletal: Positive for back pain and joint pain.  Skin: Negative.  Negative for itching and rash.  Neurological: Positive for dizziness. Negative for tingling, focal weakness, weakness and headaches.  Endo/Heme/Allergies: Bruises/bleeds easily.  Psychiatric/Behavioral: Negative for depression. The patient is not nervous/anxious and does not have insomnia.     MEDICAL HISTORY:  Past Medical History:  Diagnosis Date  . Allergic rhinitis    . Allergy-induced asthma    seasonal allergies  . Anxiety   . Arthritis    chronic hip pain,right  . Cancer ([place])    skin on back  . Cataracts, bilateral    "small"  . Chronic kidney disease    mild renal insufficiency  . Constipation   . COPD (chronic obstructive pulmonary disease) ([place])   . Depression   . Dyspnea   . GERD (gastroesophageal reflux disease)   . Headache    occular  . History of bronchitis    "about twice a year"  . History of hematuria   . Hypertension   . Hypoglycemia    distant past  . Osteopenia   . Osteopenia   . Torn meniscus    bilateral  . Vertigo    2-3x/yr  . Wears glasses     SURGICAL HISTORY: Past Surgical History:  Procedure Laterality Date  . ABDOMINAL HYSTERECTOMY    . ANTERIOR CERVICAL DECOMP/DISCECTOMY FUSION N/A 06/26/2016   Procedure: Cervical Two-Three, Cervical Three-Four, Cervical Four-Five, Cervical Five-Six Anterior Discectomy with Fusion and Plate Fixation;  Surgeon: Ditty, Kevan Ny, MD;  Location: [place];  Service: Neurosurgery;  Laterality: N/A;  C3-6 Anterior discectomy with fusion and plate fixation  . BASAL CELL CARCINOMA EXCISION     on back  . BLEPHAROPLASTY    . CARPAL TUNNEL RELEASE Bilateral   . CATARACT EXTRACTION W/PHACO Right 02/03/2019   Procedure: CATARACT EXTRACTION PHACO AND INTRAOCULAR LENS PLACEMENT (IOC) RIGHT 5.39 00:38.1;  Surgeon: Birder Robson, MD;  Location: [place];  Service: Ophthalmology;  Laterality: Right;  . CATARACT EXTRACTION W/PHACO Left 02/24/2019   Procedure: CATARACT EXTRACTION PHACO AND INTRAOCULAR LENS PLACEMENT ([place])  LEFT;  Surgeon: Birder Robson, MD;  Location: [place];  Service: Ophthalmology;  Laterality: Left;  CDE 4.86 U/S 0:26.1  . COLONOSCOPY    . COLONOSCOPY WITH PROPOFOL N/A 10/16/2016   Procedure: COLONOSCOPY WITH PROPOFOL;  Surgeon: Lollie Sails, MD;  Location: [facility]COPY;  Service: Endoscopy;  Laterality: N/A;  . EYE SURGERY     . NASAL SINUS SURGERY    . ORIF WRIST FRACTURE Left 12/02/2019   Procedure: OPEN REDUCTION INTERNAL FIXATION (ORIF) WRIST FRACTURE;  Surgeon: Corky Mull, MD;  Location: [facility];  Service: Orthopedics;  Laterality: Left;  . PARTIAL KNEE ARTHROPLASTY Right 05/26/2019   Procedure: UNICOMPARTMENTAL KNEE;  Surgeon: Corky Mull, MD;  Location: [facility];  Service: Orthopedics;  Laterality: Right;  . REPAIR EXTENSOR TENDON Left 07/10/2018   Procedure: REPAIR EXTENSOR TENDON LEFT LONG FINGER REALIGNMENT;  Surgeon: Hessie Knows, MD;  Location: [facility];  Service: Orthopedics;  Laterality: Left;  . TONSILLECTOMY      SOCIAL HISTORY: Social History   Socioeconomic History  . Marital status: Married    Spouse name: Chrissie Noa  . Number of children: Not on file  . Years of education: Not on file  . Highest education level: Not on file  Occupational History  . Not on file  Tobacco Use  . Smoking status: Never Smoker  . Smokeless tobacco: Never Used  Vaping Use  . Vaping Use: Never used  Substance and Sexual Activity  . Alcohol use: No  . Drug use: No  . Sexual activity: Not on file  Other Topics Concern  . Not on file  Social History Narrative   Lives in [place] with husband. Daughter in [place]. No alcohol. Never smoked. Worked at [employer].    Social Determinants of Health   Financial Resource Strain: Not on file  Food Insecurity: Not on file  Transportation Needs: Not on file  Physical Activity: Not on file  Stress: Not on file  Social Connections: Not on file  Intimate Partner Violence: Not on file    FAMILY HISTORY: Family History  Problem Relation Age of Onset  . Breast cancer Maternal Aunt   . Breast cancer Cousin     ALLERGIES:  is allergic to bisacodyl, ciprofloxacin, procaine, penicillins, and sulfa antibiotics.  MEDICATIONS:  Current Outpatient Medications  Medication Sig Dispense Refill  . acetaminophen (TYLENOL) 650 MG  CR tablet Take 650 mg by mouth See admin instructions. Take 1 tablet (650 mg) by mouth scheduled twice daily, may take an additional dose at mid afternoon if needed for pain.    Marland Kitchen amLODipine (NORVASC) 5 MG tablet Take 5 mg by mouth daily.    . Aromatic Inhalants (VICKS BABYRUB) OINT Apply 1 application topically.     Marland Kitchen buPROPion (WELLBUTRIN) 75 MG tablet Take 75 mg by mouth daily.    . Calcium Carb-Cholecalciferol (CALCIUM + D3 PO) Take 1 tablet by mouth daily.    . cetirizine (ZYRTEC) 10 MG tablet Take 10 mg by mouth daily as needed for allergies.    . clindamycin (CLEOCIN) 150 MG capsule Prior to dental work    . docusate sodium (COLACE) 100 MG capsule Take 100-200 mg by mouth daily as needed for mild constipation.     . DULoxetine (CYMBALTA) 60 MG capsule Take 60 mg by mouth daily.     . Dupilumab 300 MG/2ML SOPN Inject 300 mg into the skin every 14 (fourteen) days. FRIDAYS    . fluticasone (FLONASE) 50 MCG/ACT nasal spray Place 1 spray into the nose daily as needed for allergies.     Marland Kitchen  meclizine (ANTIVERT) 12.5 MG tablet Take 12.5 mg by mouth 3 (three) times daily as needed for dizziness (vertigo).     . Menthol-Methyl Salicylate (MUSCLE RUB) 10-15 % CREA Apply 1 application topically 3 (three) times daily as needed (knee pain/arthritis.).     Marland Kitchen montelukast (SINGULAIR) 10 MG tablet Take 10 mg by mouth at bedtime.    Marland Kitchen omeprazole (PRILOSEC) 40 MG capsule Take 40 mg by mouth every evening.     . polyethylene glycol (MIRALAX / GLYCOLAX) 17 g packet Take 17 g by mouth daily.    Marland Kitchen VITAMIN D PO Take 1,000 Int'l Units/day by mouth daily.     No current facility-administered medications for this visit.      PHYSICAL EXAMINATION:   Vitals:   04/19/20 1309  BP: 127/72  Pulse: 79  Resp: 20  Temp: 99.7 F (37.6 C)   Filed Weights   04/19/20 1309  Weight: 149 lb (67.6 kg)    Physical Exam Constitutional:      Comments: Patient ambulating independently. Alone.   HENT:     Head:  Normocephalic and atraumatic.     Mouth/Throat:     Pharynx: No oropharyngeal exudate.  Eyes:     Pupils: Pupils are equal, round, and reactive to light.  Cardiovascular:     Rate and Rhythm: Normal rate and regular rhythm.  Pulmonary:     Effort: Pulmonary effort is normal. No respiratory distress.     Breath sounds: Normal breath sounds. No wheezing.  Abdominal:     General: Bowel sounds are normal. There is no distension.     Palpations: Abdomen is soft. There is no mass.     Tenderness: There is no abdominal tenderness. There is no guarding or rebound.  Musculoskeletal:        General: No tenderness. Normal range of motion.     Cervical back: Normal range of motion and neck supple.  Skin:    General: Skin is warm.  Neurological:     Mental Status: She is alert and oriented to person, place, and time.  Psychiatric:        Mood and Affect: Affect normal.     LABORATORY DATA:  I have reviewed the data as listed Lab Results  Component Value Date   WBC 6.7 04/19/2020   HGB 11.8 (L) 04/19/2020   HCT 37.5 04/19/2020   MCV 84.3 04/19/2020   PLT 259 04/19/2020   Recent Labs    05/22/19 0826 05/26/19 1611 05/27/19 0403  NA 140 142 140  K 3.8 4.2 4.0  CL 104 107 108  CO2 _0 GLUCOSE 108* 109* 106*  BUN _1 CREATININE 0.88 0.79 0.75  CALCIUM 9.2 9.1 8.5*  GFRNONAA >60 >60 >60  GFRAA >60 >60 >60  PROT 7.6 7.0  --   ALBUMIN 4.4 3.9  --   AST 17 16  --   ALT 15 16  --   ALKPHOS 71 69  --   BILITOT 0.9 0.7  --      No results found.  Iron deficiency #Iron deficient anemia-hemoglobin ~9/ferritin-7 [FEB 2022; PCP]; patient symptomatic.  Poor tolerance to p.o. iron.  S/p 4 IV iron infusions.  # Today- Hb-11.8; proceed with IV iron infusion today.  Awaiting EGD/colonosopy- last weekend in May, 2022.   # B12 low- shot once a week; pills once a day [PCP].    # Easy bruising: wnl- PT PTT.  # DISPOSITION: # venofer today #  Follow up in 3 months-MD;  labs- cbc/possible venofer- Dr.B  All questions were answered. The patient knows to call the clinic with any problems, questions or concerns.    Cammie Sickle, MD 04/19/2020 2:53 PM

## 2020-05-26 ENCOUNTER — Encounter: Payer: Self-pay | Admitting: *Deleted

## 2020-05-27 ENCOUNTER — Ambulatory Visit: Payer: Medicare PPO | Admitting: Registered Nurse

## 2020-05-27 ENCOUNTER — Ambulatory Visit
Admission: RE | Admit: 2020-05-27 | Discharge: 2020-05-27 | Disposition: A | Discharge status: Home / Self Care | Payer: Medicare PPO | Attending: Gastroenterology | Admitting: Gastroenterology

## 2020-05-27 ENCOUNTER — Encounter
Admission: RE | Disposition: A | Discharge status: Home / Self Care | Payer: Self-pay | Source: Home / Self Care | Attending: Gastroenterology

## 2020-05-27 ENCOUNTER — Encounter: Payer: Self-pay | Admitting: *Deleted

## 2020-05-27 DIAGNOSIS — N189 Chronic kidney disease, unspecified: Secondary | ICD-10-CM | POA: Insufficient documentation

## 2020-05-27 DIAGNOSIS — Z880 Allergy status to penicillin: Secondary | ICD-10-CM | POA: Diagnosis not present

## 2020-05-27 DIAGNOSIS — Z79899 Other long term (current) drug therapy: Secondary | ICD-10-CM | POA: Insufficient documentation

## 2020-05-27 DIAGNOSIS — Z888 Allergy status to other drugs, medicaments and biological substances status: Secondary | ICD-10-CM | POA: Insufficient documentation

## 2020-05-27 DIAGNOSIS — I129 Hypertensive chronic kidney disease with stage 1 through stage 4 chronic kidney disease, or unspecified chronic kidney disease: Secondary | ICD-10-CM | POA: Diagnosis not present

## 2020-05-27 DIAGNOSIS — Z881 Allergy status to other antibiotic agents status: Secondary | ICD-10-CM | POA: Insufficient documentation

## 2020-05-27 DIAGNOSIS — J449 Chronic obstructive pulmonary disease, unspecified: Secondary | ICD-10-CM | POA: Diagnosis not present

## 2020-05-27 DIAGNOSIS — Z884 Allergy status to anesthetic agent status: Secondary | ICD-10-CM | POA: Insufficient documentation

## 2020-05-27 DIAGNOSIS — Z85828 Personal history of other malignant neoplasm of skin: Secondary | ICD-10-CM | POA: Diagnosis not present

## 2020-05-27 DIAGNOSIS — Z90710 Acquired absence of both cervix and uterus: Secondary | ICD-10-CM | POA: Diagnosis not present

## 2020-05-27 DIAGNOSIS — D509 Iron deficiency anemia, unspecified: Secondary | ICD-10-CM | POA: Diagnosis not present

## 2020-05-27 DIAGNOSIS — Z882 Allergy status to sulfonamides status: Secondary | ICD-10-CM | POA: Diagnosis not present

## 2020-05-27 HISTORY — PX: ESOPHAGOGASTRODUODENOSCOPY (EGD) WITH PROPOFOL: SHX5813

## 2020-05-27 HISTORY — PX: COLONOSCOPY WITH PROPOFOL: SHX5780

## 2020-05-27 SURGERY — COLONOSCOPY WITH PROPOFOL
Anesthesia: General

## 2020-05-27 MED ORDER — PROPOFOL 500 MG/50ML IV EMUL
INTRAVENOUS | Status: AC
  Filled 2020-05-27: qty 50

## 2020-05-27 MED ORDER — SODIUM CHLORIDE 0.9 % IV SOLN
INTRAVENOUS | Status: DC
  Administered 2020-05-27: 1000 mL via INTRAVENOUS

## 2020-05-27 MED ORDER — LIDOCAINE HCL (CARDIAC) PF 100 MG/5ML IV SOSY
PREFILLED_SYRINGE | INTRAVENOUS | Status: DC | PRN
  Administered 2020-05-27: 40 mg via INTRAVENOUS

## 2020-05-27 MED ORDER — PROPOFOL 10 MG/ML IV BOLUS
INTRAVENOUS | Status: DC | PRN
  Administered 2020-05-27: 20 mg via INTRAVENOUS
  Administered 2020-05-27: 50 mg via INTRAVENOUS

## 2020-05-27 MED ORDER — PROPOFOL 500 MG/50ML IV EMUL
INTRAVENOUS | Status: DC | PRN
  Administered 2020-05-27: 150 ug/kg/min via INTRAVENOUS

## 2020-05-27 MED ORDER — PROPOFOL 500 MG/50ML IV EMUL: INTRAVENOUS | Status: DC | PRN

## 2020-05-27 NOTE — Interval H&P Note (Signed)
History and Physical Interval Note:  05/27/2020 10:12 AM  Christine Ballard  has presented today for surgery, with the diagnosis of IDA.  The various methods of treatment have been discussed with the patient and family. After consideration of risks, benefits and other options for treatment, the patient has consented to  Procedure(s): COLONOSCOPY WITH PROPOFOL (N/A) ESOPHAGOGASTRODUODENOSCOPY (EGD) WITH PROPOFOL (N/A) as a surgical intervention.  The patient's history has been reviewed, patient examined, no change in status, stable for surgery.  I have reviewed the patient's chart and labs.  Questions were answered to the patient's satisfaction.     [name]  Ok to proceed with EGD/Colonoscopy

## 2020-05-27 NOTE — H&P (Signed)
Outpatient short stay form Pre-procedure 05/27/2020 10:09 AM Christine Miyamoto MD, MPH  Primary Physician: Dr. Ola Ballard  Reason for visit:  IDA  History of present illness:   80 y/o lady with history of hypertension here for EGD/Colonoscopy for IDA. Had colonoscopy four years ago that was unremarkable. Never had EGD. Has had some neck surgeries. No overt GI bleeding. Has history of hysterectomy. No NSAIDS. No significant GI symptoms. No blood donations.    Current Facility-Administered Medications:  .  0.9 %  sodium chloride infusion, , Intravenous, Continuous, [name], Hilton Cork, MD  Medications Prior to Admission  Medication Sig Dispense Refill Last Dose  . acetaminophen (TYLENOL) 650 MG CR tablet Take 650 mg by mouth See admin instructions. Take 1 tablet (650 mg) by mouth scheduled twice daily, may take an additional dose at mid afternoon if needed for pain.   05/26/2020 at Unknown time  . amLODipine (NORVASC) 5 MG tablet Take 5 mg by mouth daily.   05/27/2020 at Unknown time  . Aromatic Inhalants (VICKS BABYRUB) OINT Apply 1 application topically.    Past Week at Unknown time  . buPROPion (WELLBUTRIN) 75 MG tablet Take 75 mg by mouth daily.   05/26/2020 at Unknown time  . Calcium Carb-Cholecalciferol (CALCIUM + D3 PO) Take 1 tablet by mouth daily.   05/26/2020 at Unknown time  . cetirizine (ZYRTEC) 10 MG tablet Take 10 mg by mouth daily as needed for allergies.   Past Week at Unknown time  . clindamycin (CLEOCIN) 150 MG capsule Prior to dental work   Past Week at Unknown time  . Cyanocobalamin (VITAMIN B 12) 100 MCG LOZG Take by mouth.   05/26/2020 at Unknown time  . docusate sodium (COLACE) 100 MG capsule Take 100-200 mg by mouth daily as needed for mild constipation.    Past Week at Unknown time  . DULoxetine (CYMBALTA) 60 MG capsule Take 60 mg by mouth daily.    05/26/2020 at Unknown time  . Dupilumab 300 MG/2ML SOPN Inject 300 mg into the skin every 14 (fourteen) days. FRIDAYS   Past  Week at Unknown time  . fluticasone (FLONASE) 50 MCG/ACT nasal spray Place 1 spray into the nose daily as needed for allergies.    05/26/2020 at Unknown time  . meclizine (ANTIVERT) 12.5 MG tablet Take 12.5 mg by mouth 3 (three) times daily as needed for dizziness (vertigo).    Past Week at Unknown time  . Menthol-Methyl Salicylate (MUSCLE RUB) 10-15 % CREA Apply 1 application topically 3 (three) times daily as needed (knee pain/arthritis.).    Past Week at Unknown time  . montelukast (SINGULAIR) 10 MG tablet Take 10 mg by mouth at bedtime.   05/26/2020 at Unknown time  . omeprazole (PRILOSEC) 40 MG capsule Take 40 mg by mouth every evening.    05/26/2020 at Unknown time  . polyethylene glycol (MIRALAX / GLYCOLAX) 17 g packet Take 17 g by mouth daily.   05/26/2020 at Unknown time  . VITAMIN D PO Take 1,000 Int'l Units/day by mouth daily.   05/26/2020 at Unknown time     Allergies  Allergen Reactions  . Bisacodyl Other (See Comments)    Extreme cramping.  . Ciprofloxacin Other (See Comments)    Muscle pain and stiffness  . Procaine Other (See Comments)    syncope  . Penicillins Rash    Did it involve swelling of the face/tongue/throat, SOB, or low BP? No Did it involve sudden or severe rash/hives, skin peeling, or any reaction  on the inside of your mouth or nose? Yes Did you need to seek medical attention at a hospital or doctor's office? Yes When did it last happen?childhood allergy If all above answers are "NO", may proceed with cephalosporin use.   . Sulfa Antibiotics Rash     Past Medical History:  Diagnosis Date  . Allergic rhinitis   . Allergy-induced asthma    seasonal allergies  . Anxiety   . Arthritis    chronic hip pain,right  . Cancer ([place])    skin on back  . Cataracts, bilateral    "small"  . Chronic kidney disease    mild renal insufficiency  . Constipation   . COPD (chronic obstructive pulmonary disease) ([place])   . Depression   . Dyspnea   . GERD  (gastroesophageal reflux disease)   . Headache    occular  . History of bronchitis    "about twice a year"  . History of hematuria   . Hypertension   . Hypoglycemia    distant past  . Osteopenia   . Osteopenia   . Torn meniscus    bilateral  . Vertigo    2-3x/yr  . Wears glasses     Review of systems:  Otherwise negative.    Physical Exam  Gen: Alert, oriented. Appears stated age.  HEENT:PERRLA. Lungs: No respiratory distress CV: RRR Abd: soft, benign, no masses Ext: No edema    Planned procedures: Proceed with colonoscopy. The patient understands the nature of the planned procedure, indications, risks, alternatives and potential complications including but not limited to bleeding, infection, perforation, damage to internal organs and possible oversedation/side effects from anesthesia. The patient agrees and gives consent to proceed.  Please refer to procedure notes for findings, recommendations and patient disposition/instructions.     Christine Miyamoto MD, MPH Gastroenterology 05/27/2020  10:09 AM

## 2020-05-27 NOTE — Anesthesia Procedure Notes (Signed)
Date/Time: 05/27/2020 10:30 AM Performed by: Debe Coder, CRNA Pre-anesthesia Checklist: Patient identified, Emergency Drugs available, Suction available, Patient being monitored and Timeout performed Patient Re-evaluated:Patient Re-evaluated prior to induction Oxygen Delivery Method: Nasal cannula Induction Type: IV induction

## 2020-05-27 NOTE — Transfer of Care (Signed)
Immediate Anesthesia Transfer of Care Note  Patient: Christine Ballard  Procedure(s) Performed: COLONOSCOPY WITH PROPOFOL (N/A ) ESOPHAGOGASTRODUODENOSCOPY (EGD) WITH PROPOFOL (N/A )  Patient Location: PACU  Anesthesia Type:MAC  Level of Consciousness: drowsy and patient cooperative  Airway & Oxygen Therapy: Patient Spontanous Breathing  Post-op Assessment: Report given to RN and Post -op Vital signs reviewed and stable  Post vital signs: Reviewed and stable  Last Vitals:  Vitals Value Taken Time  BP 99/47 05/27/20 1057  Temp    Pulse 61 05/27/20 1058  Resp 18 05/27/20 1058  SpO2 92 % 05/27/20 1058    Last Pain:  Vitals:   05/27/20 0951  TempSrc: Temporal  PainSc: 3          Complications: No complications documented.

## 2020-05-27 NOTE — Op Note (Signed)
[facility] Gastroenterology Patient Name: Christine Ballard Procedure Date: 05/27/2020 9:27 AM MRN: [id number] Account #: [id number] Date of Birth: 28-Apr-1940 Admit Type: Outpatient Age: 80 Room: [facility] ENDO ROOM 3 Gender: Female Note Status: Finalized Procedure:             Upper GI endoscopy Indications:           Iron deficiency anemia Providers:             Andrey Farmer MD, MD Medicines:             Monitored Anesthesia Care Complications:         No immediate complications. Estimated blood loss:                         Minimal. Procedure:             Pre-Anesthesia Assessment:                        - Prior to the procedure, a History and Physical was                         performed, and patient medications and allergies were                         reviewed. The patient is competent. The risks and                         benefits of the procedure and the sedation options and                         risks were discussed with the patient. All questions                         were answered and informed consent was obtained.                         Patient identification and proposed procedure were                         verified by the physician, the nurse, the anesthetist                         and the technician in the endoscopy suite. Mental                         Status Examination: alert and oriented. Airway                         Examination: normal oropharyngeal airway and neck                         mobility. Respiratory Examination: clear to                         auscultation. CV Examination: normal. Prophylactic                         Antibiotics: The patient does not require prophylactic  antibiotics. Prior Anticoagulants: The patient has                         taken no previous anticoagulant or antiplatelet                         agents. ASA Grade Assessment: II - A patient with mild                          systemic disease. After reviewing the risks and                         benefits, the patient was deemed in satisfactory                         condition to undergo the procedure. The anesthesia                         plan was to use monitored anesthesia care (MAC).                         Immediately prior to administration of medications,                         the patient was re-assessed for adequacy to receive                         sedatives. The heart rate, respiratory rate, oxygen                         saturations, blood pressure, adequacy of pulmonary                         ventilation, and response to care were monitored                         throughout the procedure. The physical status of the                         patient was re-assessed after the procedure.                        After obtaining informed consent, the endoscope was                         passed under direct vision. Throughout the procedure,                         the patient's blood pressure, pulse, and oxygen                         saturations were monitored continuously. The Endoscope                         was introduced through the mouth, and advanced to the                         second part of duodenum. The upper GI endoscopy was  accomplished without difficulty. The patient tolerated                         the procedure well. Findings:      The examined esophagus was normal.      The entire examined stomach was normal. Biopsies were taken with a cold       forceps for Helicobacter pylori testing. Estimated blood loss was       minimal.      The examined duodenum was normal. Biopsies for histology were taken with       a cold forceps for evaluation of celiac disease. Estimated blood loss       was minimal. Impression:            - Normal esophagus.                        - Normal stomach. Biopsied.                        - Normal examined duodenum.  Biopsied. Recommendation:        - Discharge patient to home.                        - Resume previous diet.                        - Continue present medications.                        - Await pathology results.                        - Perform a colonoscopy today. Procedure Code(s):     --- Professional ---                        [phone], Esophagogastroduodenoscopy, flexible,                         transoral; with biopsy, single or multiple Diagnosis Code(s):     --- Professional ---                        D50.9, Iron deficiency anemia, unspecified CPT copyright 2019 American Medical Association. All rights reserved. The codes documented in this report are preliminary and upon coder review may  be revised to meet current compliance requirements. Andrey Farmer MD, MD 05/27/2020 10:55:50 AM Number of Addenda: 0 Note Initiated On: 05/27/2020 9:27 AM Estimated Blood Loss:  Estimated blood loss was minimal.      [facility]

## 2020-05-27 NOTE — Anesthesia Postprocedure Evaluation (Signed)
Anesthesia Post Note  Patient: Christine Ballard  Procedure(s) Performed: COLONOSCOPY WITH PROPOFOL (N/A ) ESOPHAGOGASTRODUODENOSCOPY (EGD) WITH PROPOFOL (N/A )  Patient location during evaluation: Phase II Anesthesia Type: General Level of consciousness: awake and alert, awake and oriented Pain management: pain level controlled Vital Signs Assessment: post-procedure vital signs reviewed and stable Respiratory status: spontaneous breathing, nonlabored ventilation and respiratory function stable Cardiovascular status: blood pressure returned to baseline and stable Postop Assessment: no apparent nausea or vomiting Anesthetic complications: no   No complications documented.   Last Vitals:  Vitals:   05/27/20 1110 05/27/20 1116  BP:  (!) 141/64  Pulse: (!) 55 61  Resp: 15 14  Temp:    SpO2: 98% 99%    Last Pain:  Vitals:   05/27/20 1050  TempSrc: Oral  PainSc:                  Phill Mutter

## 2020-05-27 NOTE — Op Note (Signed)
[facility] Gastroenterology Patient Name: Christine Ballard Procedure Date: 05/27/2020 9:27 AM MRN: [id number] Account #: [id number] Date of Birth: 04/17/1940 Admit Type: Outpatient Age: 80 Room: [facility] ENDO ROOM 3 Gender: Female Note Status: Finalized Procedure:             Colonoscopy Indications:           Iron deficiency anemia Providers:             Andrey Farmer MD, MD Medicines:             Monitored Anesthesia Care Complications:         No immediate complications. Procedure:             Pre-Anesthesia Assessment:                        - Prior to the procedure, a History and Physical was                         performed, and patient medications and allergies were                         reviewed. The patient is competent. The risks and                         benefits of the procedure and the sedation options and                         risks were discussed with the patient. All questions                         were answered and informed consent was obtained.                         Patient identification and proposed procedure were                         verified by the physician, the nurse, the anesthetist                         and the technician in the endoscopy suite. Mental                         Status Examination: alert and oriented. Airway                         Examination: normal oropharyngeal airway and neck                         mobility. Respiratory Examination: clear to                         auscultation. CV Examination: normal. Prophylactic                         Antibiotics: The patient does not require prophylactic                         antibiotics. Prior Anticoagulants: The patient has  taken no previous anticoagulant or antiplatelet                         agents. ASA Grade Assessment: II - A patient with mild                         systemic disease. After reviewing the risks and                          benefits, the patient was deemed in satisfactory                         condition to undergo the procedure. The anesthesia                         plan was to use monitored anesthesia care (MAC).                         Immediately prior to administration of medications,                         the patient was re-assessed for adequacy to receive                         sedatives. The heart rate, respiratory rate, oxygen                         saturations, blood pressure, adequacy of pulmonary                         ventilation, and response to care were monitored                         throughout the procedure. The physical status of the                         patient was re-assessed after the procedure.                        After obtaining informed consent, the colonoscope was                         passed under direct vision. Throughout the procedure,                         the patient's blood pressure, pulse, and oxygen                         saturations were monitored continuously. The                         Colonoscope was introduced through the anus and                         advanced to the the terminal ileum. The colonoscopy                         was performed without difficulty. The patient  tolerated the procedure well. The quality of the bowel                         preparation was good. Findings:      The perianal and digital rectal examinations were normal.      The terminal ileum appeared normal.      The entire examined colon appeared normal on direct and retroflexion       views. Impression:            - The examined portion of the ileum was normal.                        - The entire examined colon is normal on direct and                         retroflexion views.                        - No specimens collected. Recommendation:        - Discharge patient to home.                        - Resume previous diet.                         - Continue present medications.                        - Repeat colonoscopy is not recommended due to current                         age (54 years or older) for surveillance.                        - To visualize the small bowel, perform video capsule                         endoscopy at appointment to be scheduled. Procedure Code(s):     --- Professional ---                        [phone], Colonoscopy, flexible; diagnostic, including                         collection of specimen(s) by brushing or washing, when                         performed (separate procedure) Diagnosis Code(s):     --- Professional ---                        D50.9, Iron deficiency anemia, unspecified CPT copyright 2019 American Medical Association. All rights reserved. The codes documented in this report are preliminary and upon coder review may  be revised to meet current compliance requirements. Andrey Farmer MD, MD 05/27/2020 10:58:24 AM Number of Addenda: 0 Note Initiated On: 05/27/2020 9:27 AM Scope Withdrawal Time: 0 hours 9 minutes 44 seconds  Total Procedure Duration: 0 hours 13 minutes 19 seconds  Estimated Blood Loss:  Estimated blood loss: none.      [facility]

## 2020-05-27 NOTE — Anesthesia Preprocedure Evaluation (Addendum)
Anesthesia Evaluation  Patient identified by MRN, date of birth, ID band Patient awake    Reviewed: Allergy & Precautions, NPO status , Patient's Chart, lab work & pertinent test results  History of Anesthesia Complications Negative for: history of anesthetic complications  Airway Mallampati: III  TM Distance: >3 FB Neck ROM: Full    Dental  (+) Dental Advidsory Given   Pulmonary neg shortness of breath, asthma , neg sleep apnea, COPD,  COPD inhaler, neg recent URI,    breath sounds clear to auscultation- rhonchi (-) wheezing      Cardiovascular hypertension, Pt. on medications (-) angina(-) CAD, (-) Past MI, (-) Cardiac Stents and (-) CABG  Rhythm:Regular Rate:Normal - Systolic murmurs and - Diastolic murmurs    Neuro/Psych  Headaches, neg Seizures PSYCHIATRIC DISORDERS Anxiety Depression  Neuromuscular disease    GI/Hepatic Neg liver ROS, Bowel prep,GERD  Medicated,  Endo/Other  negative endocrine ROSneg diabetes  Renal/GU CRFRenal disease     Musculoskeletal  (+) Arthritis ,   Abdominal (+) - obese,   Peds  Hematology negative hematology ROS (+) anemia ,   Anesthesia Other Findings Allergic rhinitis Allergy-induced asthma     Comment:  seasonal allergies Anxiety Arthritis     Comment:  chronic hip pain,right Cancer (HCC)     Comment:  skin on back Cataracts, bilateral     Comment:  "small" Chronic kidney disease     Comment:  mild renal insufficiency Constipation COPD (chronic obstructive pulmonary disease) (HCC) Depression Dyspnea GERD (gastroesophageal reflux disease) Headache     Comment:  occular bronchitis     Comment:  "about twice a year" History of hematuria Hypertension Hypoglycemia Osteopenia Torn meniscus     Comment:  bilateral Vertigo     Comment:  2-3x/yr Wears glasses   Reproductive/Obstetrics                            Lab Results  Component Value  Date   WBC 6.7 04/19/2020   HGB 11.8 (L) 04/19/2020   HCT 37.5 04/19/2020   MCV 84.3 04/19/2020   PLT 259 04/19/2020    Anesthesia Physical  Anesthesia Plan  ASA: III  Anesthesia Plan: General   Post-op Pain Management:  Regional for Post-op pain   Induction: Intravenous  PONV Risk Score and Plan: 2 and TIVA and Propofol infusion  Airway Management Planned: Natural Airway and Nasal Cannula  Additional Equipment:   Intra-op Plan:   Post-operative Plan: Extubation in OR  Informed Consent: I have reviewed the patients History and Physical, chart, labs and discussed the procedure including the risks, benefits and alternatives for the proposed anesthesia with the patient or authorized representative who has indicated his/her understanding and acceptance.     Dental advisory given  Plan Discussed with: CRNA and Anesthesiologist  Anesthesia Plan Comments:        Anesthesia Quick Evaluation

## 2020-05-30 ENCOUNTER — Encounter: Payer: Self-pay | Admitting: Gastroenterology

## 2020-05-31 LAB — SURGICAL PATHOLOGY

## 2020-06-20 ENCOUNTER — Other Ambulatory Visit: Payer: Self-pay | Admitting: Neurosurgery

## 2020-06-20 DIAGNOSIS — Z981 Arthrodesis status: Secondary | ICD-10-CM

## 2020-06-20 DIAGNOSIS — M542 Cervicalgia: Secondary | ICD-10-CM

## 2020-06-22 ENCOUNTER — Encounter: Payer: Self-pay | Admitting: Internal Medicine

## 2020-07-01 ENCOUNTER — Ambulatory Visit
Admission: RE | Admit: 2020-07-01 | Discharge: 2020-07-01 | Disposition: A | Discharge status: Home / Self Care | Payer: Medicare PPO | Source: Ambulatory Visit | Attending: Neurosurgery | Admitting: Neurosurgery

## 2020-07-01 ENCOUNTER — Other Ambulatory Visit: Payer: Self-pay

## 2020-07-01 DIAGNOSIS — Z981 Arthrodesis status: Secondary | ICD-10-CM

## 2020-07-01 DIAGNOSIS — M542 Cervicalgia: Secondary | ICD-10-CM

## 2020-07-18 ENCOUNTER — Other Ambulatory Visit: Payer: Self-pay | Admitting: *Deleted

## 2020-07-18 ENCOUNTER — Other Ambulatory Visit: Payer: Self-pay

## 2020-07-18 DIAGNOSIS — E611 Iron deficiency: Secondary | ICD-10-CM

## 2020-07-19 ENCOUNTER — Other Ambulatory Visit: Payer: Self-pay

## 2020-07-19 ENCOUNTER — Inpatient Hospital Stay (HOSPITAL_BASED_OUTPATIENT_CLINIC_OR_DEPARTMENT_OTHER): Payer: Medicare PPO | Admitting: Oncology

## 2020-07-19 ENCOUNTER — Inpatient Hospital Stay: Payer: Medicare PPO | Attending: Oncology

## 2020-07-19 ENCOUNTER — Inpatient Hospital Stay: Payer: Medicare PPO

## 2020-07-19 VITALS — BP 121/69 | HR 63 | Temp 98.2°F | Resp 18 | Wt 144.4 lb

## 2020-07-19 DIAGNOSIS — K909 Intestinal malabsorption, unspecified: Secondary | ICD-10-CM | POA: Diagnosis not present

## 2020-07-19 DIAGNOSIS — E611 Iron deficiency: Secondary | ICD-10-CM

## 2020-07-19 DIAGNOSIS — D509 Iron deficiency anemia, unspecified: Secondary | ICD-10-CM | POA: Insufficient documentation

## 2020-07-19 DIAGNOSIS — D649 Anemia, unspecified: Secondary | ICD-10-CM

## 2020-07-19 DIAGNOSIS — E538 Deficiency of other specified B group vitamins: Secondary | ICD-10-CM | POA: Diagnosis not present

## 2020-07-19 DIAGNOSIS — Z79899 Other long term (current) drug therapy: Secondary | ICD-10-CM | POA: Insufficient documentation

## 2020-07-19 LAB — CBC WITH DIFFERENTIAL/PLATELET
Abs Immature Granulocytes: 0.02 10*3/uL (ref 0.00–0.07)
Basophils Absolute: 0 10*3/uL (ref 0.0–0.1)
Basophils Relative: 1 %
Eosinophils Absolute: 0.1 10*3/uL (ref 0.0–0.5)
Eosinophils Relative: 2 %
HCT: 36.7 % (ref 36.0–46.0)
Hemoglobin: 11.9 g/dL — ABNORMAL LOW (ref 12.0–15.0)
Immature Granulocytes: 0 %
Lymphocytes Relative: 29 %
Lymphs Abs: 2 10*3/uL (ref 0.7–4.0)
MCH: 29.4 pg (ref 26.0–34.0)
MCHC: 32.4 g/dL (ref 30.0–36.0)
MCV: 90.6 fL (ref 80.0–100.0)
Monocytes Absolute: 0.6 10*3/uL (ref 0.1–1.0)
Monocytes Relative: 9 %
Neutro Abs: 4.2 10*3/uL (ref 1.7–7.7)
Neutrophils Relative %: 59 %
Platelets: 235 10*3/uL (ref 150–400)
RBC: 4.05 MIL/uL (ref 3.87–5.11)
RDW: 13.6 % (ref 11.5–15.5)
WBC: 7 10*3/uL (ref 4.0–10.5)
nRBC: 0 % (ref 0.0–0.2)

## 2020-07-19 MED ORDER — IRON SUCROSE 20 MG/ML IV SOLN: 200.0000 mg | Freq: Once | INTRAVENOUS | Status: DC

## 2020-07-19 MED ORDER — SODIUM CHLORIDE 0.9 % IV SOLN: 200.0000 mg | Freq: Once | INTRAVENOUS | Status: DC

## 2020-07-19 MED ORDER — SODIUM CHLORIDE 0.9 % IV SOLN
Freq: Once | INTRAVENOUS | Status: DC
  Filled 2020-07-19: qty 250

## 2020-07-19 NOTE — Addendum Note (Signed)
Addended by: Jacqualyn Posey B on: 07/19/2020 03:27 PM   Modules accepted: Orders

## 2020-07-19 NOTE — Progress Notes (Signed)
[facility] NOTE  Patient Care Team: Ebbie Ridge, MD as PCP - General (Cardiology) Cammie Sickle, MD as Consulting Physician (Internal Medicine) Efrain Sella, MD as Consulting Physician (Gastroenterology)  CHIEF COMPLAINTS/PURPOSE OF CONSULTATION: ANEMIA   HEMATOLOGY HISTORY:  # MICROCYTIC ANEMIA [FEB 2022- Hb 9.6; Ferritin-6] > 25 years Hx of hematuria [[place]; urology]-cystoscopy;  EGD-none;  colonoscopy-Oct 2018; polyps ; capsule-Bone marrow Biopsy-NONE  HISTORY OF PRESENTING ILLNESS:  Christine Ballard 80 y.o.  female iron deficient anemia is here for follow-up.  She was last seen in clinic on 04/19/2020 and received IV Venofer.  She had a colonoscopy/EGD on 05/27/2020 which showed essentially normal colon and esophagus.  In the interim, she reports doing pretty good.  Denies any acute symptoms.  She is currently taking B12 supplements but is not taking iron.  She feels she continues to improve daily.  Review of Systems  Constitutional: Negative.  Negative for chills, fever, malaise/fatigue and weight loss.  HENT:  Negative for congestion, ear pain and tinnitus.   Eyes: Negative.  Negative for blurred vision and double vision.  Respiratory: Negative.  Negative for cough, sputum production and shortness of breath.   Cardiovascular: Negative.  Negative for chest pain, palpitations and leg swelling.  Gastrointestinal: Negative.  Negative for abdominal pain, constipation, diarrhea, nausea and vomiting.  Genitourinary:  Negative for dysuria, frequency and urgency.  Musculoskeletal:  Negative for back pain and falls.  Skin: Negative.  Negative for rash.  Neurological: Negative.  Negative for weakness and headaches.  Endo/Heme/Allergies: Negative.  Does not bruise/bleed easily.  Psychiatric/Behavioral: Negative.  Negative for depression. The patient is not nervous/anxious and does not have insomnia.    MEDICAL HISTORY:  Past Medical History:   Diagnosis Date   Allergic rhinitis    Allergy-induced asthma    seasonal allergies   Anxiety    Arthritis    chronic hip pain,right   Cancer (HCC)    skin on back   Cataracts, bilateral    "small"   Chronic kidney disease    mild renal insufficiency   Constipation    COPD (chronic obstructive pulmonary disease) (HCC)    Depression    Dyspnea    GERD (gastroesophageal reflux disease)    Headache    occular   History of bronchitis    "about twice a year"   History of hematuria    Hypertension    Hypoglycemia    distant past   Osteopenia    Osteopenia    Torn meniscus    bilateral   Vertigo    2-3x/yr   Wears glasses     SURGICAL HISTORY: Past Surgical History:  Procedure Laterality Date   ABDOMINAL HYSTERECTOMY     ANTERIOR CERVICAL DECOMP/DISCECTOMY FUSION N/A 06/26/2016   Procedure: Cervical Two-Three, Cervical Three-Four, Cervical Four-Five, Cervical Five-Six Anterior Discectomy with Fusion and Plate Fixation;  Surgeon: Ditty, Kevan Ny, MD;  Location: [facility];  Service: Neurosurgery;  Laterality: N/A;  C3-6 Anterior discectomy with fusion and plate fixation   BASAL CELL CARCINOMA EXCISION     on back   BLEPHAROPLASTY     CARPAL TUNNEL RELEASE Bilateral    CATARACT EXTRACTION W/PHACO Right 02/03/2019   Procedure: CATARACT EXTRACTION PHACO AND INTRAOCULAR LENS PLACEMENT ([place]) RIGHT 5.39 00:38.1;  Surgeon: Birder Robson, MD;  Location: [facility];  Service: Ophthalmology;  Laterality: Right;   CATARACT EXTRACTION W/PHACO Left 02/24/2019   Procedure: CATARACT EXTRACTION PHACO AND INTRAOCULAR LENS PLACEMENT ([facility])  LEFT;  Surgeon: Birder Robson, MD;  Location: [facility];  Service: Ophthalmology;  Laterality: Left;  CDE 4.86 U/S 0:26.1   COLONOSCOPY     COLONOSCOPY WITH PROPOFOL N/A 10/16/2016   Procedure: COLONOSCOPY WITH PROPOFOL;  Surgeon: Lollie Sails, MD;  Location: [facility] ENDOSCOPY;  Service: Endoscopy;  Laterality: N/A;    COLONOSCOPY WITH PROPOFOL N/A 05/27/2020   Procedure: COLONOSCOPY WITH PROPOFOL;  Surgeon: [name], MD;  Location: [facility] ENDOSCOPY;  Service: Endoscopy;  Laterality: N/A;   ESOPHAGOGASTRODUODENOSCOPY (EGD) WITH PROPOFOL N/A 05/27/2020   Procedure: ESOPHAGOGASTRODUODENOSCOPY (EGD) WITH PROPOFOL;  Surgeon: [name], MD;  Location: [facility] ENDOSCOPY;  Service: Endoscopy;  Laterality: N/A;   EYE SURGERY     NASAL SINUS SURGERY     ORIF WRIST FRACTURE Left 12/02/2019   Procedure: OPEN REDUCTION INTERNAL FIXATION (ORIF) WRIST FRACTURE;  Surgeon: Corky Mull, MD;  Location: [facility];  Service: Orthopedics;  Laterality: Left;   PARTIAL KNEE ARTHROPLASTY Right 05/26/2019   Procedure: UNICOMPARTMENTAL KNEE;  Surgeon: Corky Mull, MD;  Location: [facility];  Service: Orthopedics;  Laterality: Right;   REPAIR EXTENSOR TENDON Left 07/10/2018   Procedure: REPAIR EXTENSOR TENDON LEFT LONG FINGER REALIGNMENT;  Surgeon: Hessie Knows, MD;  Location: [facility];  Service: Orthopedics;  Laterality: Left;   TONSILLECTOMY      SOCIAL HISTORY: Social History   Socioeconomic History   Marital status: Married    Spouse name: kenneth   Number of children: Not on file   Years of education: Not on file   Highest education level: Not on file  Occupational History   Not on file  Tobacco Use   Smoking status: Never   Smokeless tobacco: Never  Vaping Use   Vaping Use: Never used  Substance and Sexual Activity   Alcohol use: No   Drug use: No   Sexual activity: Not on file  Other Topics Concern   Not on file  Social History Narrative   Lives in [place] with husband. Daughter in [place]. No alcohol. Never smoked. Worked at [employer].    Social Determinants of Health   Financial Resource Strain: Not on file  Food Insecurity: Not on file  Transportation Needs: Not on file  Physical Activity: Not on file  Stress: Not on file  Social Connections: Not on  file  Intimate Partner Violence: Not on file    FAMILY HISTORY: Family History  Problem Relation Age of Onset   Breast cancer Maternal Aunt    Breast cancer Cousin     ALLERGIES:  is allergic to bisacodyl, ciprofloxacin, procaine, penicillins, and sulfa antibiotics.  MEDICATIONS:  Current Outpatient Medications  Medication Sig Dispense Refill   acetaminophen (TYLENOL) 650 MG CR tablet Take 650 mg by mouth See admin instructions. Take 1 tablet (650 mg) by mouth scheduled twice daily, may take an additional dose at mid afternoon if needed for pain.     amLODipine (NORVASC) 5 MG tablet Take 2.5 mg by mouth daily.     Aromatic Inhalants (VICKS BABYRUB) OINT Apply 1 application topically.      buPROPion (WELLBUTRIN) 75 MG tablet Take 75 mg by mouth daily.     Calcium Carb-Cholecalciferol (CALCIUM + D3 PO) Take 1 tablet by mouth daily.     cetirizine (ZYRTEC) 10 MG tablet Take 10 mg by mouth daily as needed for allergies.     clindamycin (CLEOCIN) 150 MG capsule Prior to dental work     Cyanocobalamin (  VITAMIN B 12) 100 MCG LOZG Take by mouth.     docusate sodium (COLACE) 100 MG capsule Take 100-200 mg by mouth daily as needed for mild constipation.      DULoxetine (CYMBALTA) 60 MG capsule Take 60 mg by mouth daily.      Dupilumab 300 MG/2ML SOPN Inject 300 mg into the skin every 14 (fourteen) days. FRIDAYS     fluticasone (FLONASE) 50 MCG/ACT nasal spray Place 1 spray into the nose daily as needed for allergies.      meclizine (ANTIVERT) 12.5 MG tablet Take 12.5 mg by mouth 3 (three) times daily as needed for dizziness (vertigo).      Menthol-Methyl Salicylate (MUSCLE RUB) 10-15 % CREA Apply 1 application topically 3 (three) times daily as needed (knee pain/arthritis.).      montelukast (SINGULAIR) 10 MG tablet Take 10 mg by mouth at bedtime.     omeprazole (PRILOSEC) 40 MG capsule Take 40 mg by mouth every evening.      polyethylene glycol (MIRALAX / GLYCOLAX) 17 g packet Take 17 g by  mouth daily.     VITAMIN D PO Take 1,000 Int'l Units/day by mouth daily.     No current facility-administered medications for this visit.      PHYSICAL EXAMINATION:   Vitals:   07/19/20 1357  BP: 121/69  Pulse: 63  Resp: 18  Temp: 98.2 F (36.8 C)  SpO2: 100%   Filed Weights   07/19/20 1357  Weight: 144 lb 7 oz (65.5 kg)    Physical Exam Constitutional:      Appearance: Normal appearance.  HENT:     Head: Normocephalic and atraumatic.  Eyes:     Pupils: Pupils are equal, round, and reactive to light.  Cardiovascular:     Rate and Rhythm: Normal rate and regular rhythm.     Heart sounds: Normal heart sounds. No murmur heard. Pulmonary:     Effort: Pulmonary effort is normal.     Breath sounds: Normal breath sounds. No wheezing.  Abdominal:     General: Bowel sounds are normal. There is no distension.     Palpations: Abdomen is soft.     Tenderness: There is no abdominal tenderness.  Musculoskeletal:        General: Normal range of motion.     Cervical back: Normal range of motion.  Skin:    General: Skin is warm and dry.     Findings: No rash.  Neurological:     Mental Status: She is alert and oriented to person, place, and time.     Gait: Gait is intact.  Psychiatric:        Mood and Affect: Mood and affect normal.        Cognition and Memory: Memory normal.        Judgment: Judgment normal.    LABORATORY DATA:  I have reviewed the data as listed Lab Results  Component Value Date   WBC 7.0 07/19/2020   HGB 11.9 (L) 07/19/2020   HCT 36.7 07/19/2020   MCV 90.6 07/19/2020   PLT 235 07/19/2020   No results for input(s): NA, K, CL, CO2, GLUCOSE, BUN, CREATININE, CALCIUM, GFRNONAA, GFRAA, PROT, ALBUMIN, AST, ALT, ALKPHOS, BILITOT, BILIDIR, IBILI in the last 8760 hours.    CT CERVICAL SPINE WO CONTRAST  Result Date: 07/03/2020 CLINICAL DATA:  Neck pain EXAM: CT CERVICAL SPINE WITHOUT CONTRAST TECHNIQUE: Multidetector CT imaging of the cervical spine  was performed without intravenous contrast. Multiplanar CT image  reconstructions were also generated. COMPARISON:  04/10/2017 FINDINGS: Alignment: 2 mm of grade 1 degenerative anterolisthesis at C7-T1. Skull base and vertebrae: Stable small ossicle between the odontoid and basion eccentric to the right. Anterior C1-2 severe articular space narrowing and associated spurring with mild subcortical sclerosis similar to prior. ACDF at C2-C3-C4-C5-C6 with interbody spacer devices. Fused facets bilaterally at C2-C3-C4-C5. The C5-6 facet joints do not appear fused at this time. Stable mild sclerosis of the C6 vertebral body. Compared to previous the C6 screws have backed out from the plate by about 1.5 mm, with associated spurring no extending around the screw heads for example on images 28 through 36 of series 6. No abnormal lucency along the screws noted. No fracture or acute bony findings. Soft tissues and spinal canal: Aortic and branch vessel atherosclerotic calcifications. Small bilateral cervical lymph nodes are not pathologically enlarged. Disc levels:  C2-3: No appreciable impingement.  Fused level. C3-4: No appreciable impingement.  Fused level. C4-5: Moderate right and mild left foraminal stenosis due to intervertebral spurring. Fused level. C5-6: Moderate right and mild left foraminal stenosis due to intervertebral spurring. Fused level. C6-7: Mild to moderate right and mild left foraminal stenosis due to uncinate spurring. Mild central narrowing of the thecal sac due to disc bulge. C7-T1: No impingement. Upper chest: No additional findings. Other: No supplemental non-categorized findings. IMPRESSION: 1. Prior ACDF at C2-C3-C4-C5-C6. The screws at the C6 level have backed out by about 1.5 cm, with the screw heads currently mostly surrounded by encasing osteophyte. No lucency around the threaded portions of the screws. 2. Intervertebral/uncinate spurring results in moderate foraminal impingement at C4-5 and C5-6  along with mild to moderate impingement at C6-7. Disc bulge at C6-7 contributes to mild central narrowing of the thecal sac. Electronically Signed   By: Van Clines M.D.   On: 07/03/2020 10:22    No problem-specific Assessment & Plan notes found for this encounter.  Plan/Assessment:  Iron deficient anemia- Unclear etiology-malabsorption Unable to tolerate p.o. iron. She has received intermittent IV iron infusions last given on 04/19/2020. Had EGD/colonoscopy which was essentially unremarkable on 05/27/2020. Labs from today show hemoglobin of 11.9. Proceed with 1 dose of IV Venofer. RTC in 3 months for repeat labs and assessment with possible iron  B12 deficiency- We will recheck her B12 level at her next visit. Continue oral B12 supplements.    Disposition- Proceed with IV Venofer today. RTC in 3 months for follow-up with labs (CBC, ferritin, iron panel and B12), MD assessment and possible IV Venofer.   All questions were answered. The patient knows to call the clinic with any problems, questions or concerns.  I spent 20 minutes dedicated to the care of this patient (face-to-face and non-face-to-face) on the date of the encounter to include what is described in the assessment and plan.    Jacquelin Hawking, NP 07/19/2020 2:07 PM

## 2020-07-19 NOTE — Progress Notes (Signed)
Unable to obtain IV access for venofer infusion today. Patient rescheduled for 7/28 at 1pm

## 2020-07-27 ENCOUNTER — Other Ambulatory Visit: Payer: Self-pay | Admitting: *Deleted

## 2020-07-27 DIAGNOSIS — E611 Iron deficiency: Secondary | ICD-10-CM

## 2020-07-28 ENCOUNTER — Inpatient Hospital Stay: Payer: Medicare PPO

## 2020-07-28 VITALS — BP 133/70 | HR 65 | Temp 97.8°F | Resp 17

## 2020-07-28 DIAGNOSIS — D509 Iron deficiency anemia, unspecified: Secondary | ICD-10-CM | POA: Diagnosis not present

## 2020-07-28 DIAGNOSIS — D649 Anemia, unspecified: Secondary | ICD-10-CM

## 2020-07-28 DIAGNOSIS — E611 Iron deficiency: Secondary | ICD-10-CM

## 2020-07-28 LAB — VITAMIN B12: Vitamin B-12: 929 pg/mL — ABNORMAL HIGH (ref 180–914)

## 2020-07-28 MED ORDER — IRON SUCROSE 20 MG/ML IV SOLN
200.0000 mg | Freq: Once | INTRAVENOUS | Status: AC
  Administered 2020-07-28: 200 mg via INTRAVENOUS
  Filled 2020-07-28: qty 10

## 2020-07-28 MED ORDER — SODIUM CHLORIDE 0.9 % IV SOLN: 200.0000 mg | Freq: Once | INTRAVENOUS | Status: DC

## 2020-07-28 MED ORDER — SODIUM CHLORIDE 0.9 % IV SOLN
Freq: Once | INTRAVENOUS | Status: AC
  Filled 2020-07-28: qty 250

## 2020-07-28 NOTE — Patient Instructions (Signed)

## 2020-10-17 ENCOUNTER — Telehealth: Payer: Self-pay | Admitting: Internal Medicine

## 2020-10-17 ENCOUNTER — Other Ambulatory Visit: Payer: Self-pay | Admitting: Infectious Diseases

## 2020-10-17 DIAGNOSIS — Z1231 Encounter for screening mammogram for malignant neoplasm of breast: Secondary | ICD-10-CM

## 2020-10-17 NOTE — Telephone Encounter (Signed)
Dr. Jacinto Reap- please advise. Labs are normal. Can we move apts out by 3 months?     CBC w/auto Differential (5 Part) Order: [id number]  Ref Range & Units 3 d ago  WBC (White Blood Cell Count) 4.1 - 10.2 10^3/uL 6.6   RBC (Red Blood Cell Count) 4.04 - 5.48 10^6/uL 4.14   Hemoglobin 12.0 - 15.0 gm/dL 12.4   Hematocrit 35.0 - 47.0 % 38.2   MCV (Mean Corpuscular Volume) 80.0 - 100.0 fl 92.3   MCH (Mean Corpuscular Hemoglobin) 27.0 - 31.2 pg 30.0   MCHC (Mean Corpuscular Hemoglobin Concentration) 32.0 - 36.0 gm/dL 32.5   Platelet Count 150 - 450 10^3/uL 245   RDW-CV (Red Cell Distribution Width) 11.6 - 14.8 % 13.4   MPV (Mean Platelet Volume) 9.4 - 12.4 fl 9.6   Neutrophils 1.50 - 7.80 10^3/uL 4.21   Lymphocytes 1.00 - 3.60 10^3/uL 1.62   Monocytes 0.00 - 1.50 10^3/uL 0.55   Eosinophils 0.00 - 0.55 10^3/uL 0.15   Basophils 0.00 - 0.09 10^3/uL 0.03   Neutrophil % 32.0 - 70.0 % 63.9   Lymphocyte % 10.0 - 50.0 % 24.7   Monocyte % 4.0 - 13.0 % 8.4   Eosinophil % 1.0 - 5.0 % 2.3   Basophil% 0.0 - 2.0 % 0.5   Immature Granulocyte % <=0.7 % 0.2   Immature Granulocyte Count <=0.06 10^3/L 0.01

## 2020-10-17 NOTE — Telephone Encounter (Signed)
Pt called to report that she had labs drawn at [facility] last week and doesn't thinks she needs them drawn again for her appt. She is asking for someone to look and see and let her know if she doesn't need them drawn again,

## 2020-10-18 NOTE — Telephone Encounter (Signed)
I spoke with Dr. Jacinto Reap- ok to r/s pt's apts by 6 months. Colette- pls r/s apts.

## 2020-10-19 NOTE — Telephone Encounter (Signed)
Colette- I have r/s pt's apts out for 6 months

## 2020-10-21 ENCOUNTER — Inpatient Hospital Stay: Payer: Medicare PPO | Admitting: Internal Medicine

## 2020-10-21 ENCOUNTER — Inpatient Hospital Stay: Payer: Medicare PPO

## 2020-11-07 ENCOUNTER — Other Ambulatory Visit: Payer: Self-pay

## 2020-11-07 ENCOUNTER — Ambulatory Visit
Admission: RE | Admit: 2020-11-07 | Discharge: 2020-11-07 | Disposition: A | Discharge status: Home / Self Care | Payer: Medicare PPO | Source: Ambulatory Visit | Attending: Infectious Diseases | Admitting: Infectious Diseases

## 2020-11-07 DIAGNOSIS — Z1231 Encounter for screening mammogram for malignant neoplasm of breast: Secondary | ICD-10-CM | POA: Insufficient documentation

## 2020-11-10 ENCOUNTER — Other Ambulatory Visit: Payer: Self-pay | Admitting: Infectious Diseases

## 2020-11-10 DIAGNOSIS — N6489 Other specified disorders of breast: Secondary | ICD-10-CM

## 2020-11-10 DIAGNOSIS — R928 Other abnormal and inconclusive findings on diagnostic imaging of breast: Secondary | ICD-10-CM

## 2020-11-23 ENCOUNTER — Ambulatory Visit
Admission: RE | Admit: 2020-11-23 | Discharge: 2020-11-23 | Disposition: A | Discharge status: Home / Self Care | Payer: Medicare PPO | Source: Ambulatory Visit | Attending: Infectious Diseases | Admitting: Infectious Diseases

## 2020-11-23 ENCOUNTER — Other Ambulatory Visit: Payer: Self-pay

## 2020-11-23 DIAGNOSIS — R928 Other abnormal and inconclusive findings on diagnostic imaging of breast: Secondary | ICD-10-CM

## 2020-11-23 DIAGNOSIS — N6489 Other specified disorders of breast: Secondary | ICD-10-CM | POA: Insufficient documentation

## 2021-04-19 ENCOUNTER — Telehealth: Payer: Self-pay | Admitting: *Deleted

## 2021-04-19 NOTE — Telephone Encounter (Signed)
CBC order cancelled for upcoming appt ?

## 2021-04-19 NOTE — Telephone Encounter (Signed)
Patient called stating she had labs drawn at [facility], [facility] eis asking not to have duplicate labs drawn at appointment this Friday. ( The only things that would be duplicate is the CBC) ? ?CBC w/auto Differential (5 Part) ?Specimen:  Blood ? Ref Range & Units 1 d ago  ?WBC (White Blood Cell Count) 4.1 - 10.2 10?3/uL 5.2   ?RBC (Red Blood Cell Count) 4.04 - 5.48 10?6/uL 3.91 Low    ?Hemoglobin 12.0 - 15.0 gm/dL 10.7 Low    ?Hematocrit 35.0 - 47.0 % 34.1 Low    ?MCV (Mean Corpuscular Volume) 80.0 - 100.0 fl 87.2   ?MCH (Mean Corpuscular Hemoglobin) 27.0 - 31.2 pg 27.4   ?MCHC (Mean Corpuscular Hemoglobin Concentration) 32.0 - 36.0 gm/dL 31.4 Low    ?Platelet Count 150 - 450 10?3/uL 267   ?RDW-CV (Red Cell Distribution Width) 11.6 - 14.8 % 13.1   ?MPV (Mean Platelet Volume) 9.4 - 12.4 fl 9.7   ?Neutrophils 1.50 - 7.80 10?3/uL 3.11   ?Lymphocytes 1.00 - 3.60 10?3/uL 1.52   ?Monocytes 0.00 - 1.50 10?3/uL 0.46   ?Eosinophils 0.00 - 0.55 10?3/uL 0.10   ?Basophils 0.00 - 0.09 10?3/uL 0.03   ?Neutrophil % 32.0 - 70.0 % 59.4   ?Lymphocyte % 10.0 - 50.0 % 29.1   ?Monocyte % 4.0 - 13.0 % 8.8   ?Eosinophil % 1.0 - 5.0 % 1.9   ?Basophil% 0.0 - 2.0 % 0.6   ?Immature Granulocyte % <=0.7 % 0.2   ?Immature Granulocyte Count <=0.06 10^3/?L 0.01   ?Resulting Agency  [facility]  ?Specimen Collected: 04/18/21 09:42 Last Resulted: 04/18/21 10:31  ?Received From: [facility]  Result Received: 04/19/21 11:13  ?Contains abnormal data Comprehensive Metabolic Panel (CMP) ?Specimen:  Blood ? Ref Range & Units 1 d ago  ?Glucose 70 - 110 mg/dL 105   ?Sodium 136 - 145 mmol/L 140   ?Potassium 3.6 - 5.1 mmol/L 4.8   ?Chloride 97 - 109 mmol/L 106   ?Carbon Dioxide (CO2) 22.0 - 32.0 mmol/L 28.1   ?Urea Nitrogen (BUN) 7 - 25 mg/dL 15   ?Creatinine 0.6 - 1.1 mg/dL 0.9   ?Glomerular Filtration Rate (eGFR), MDRD Estimate >60 mL/min/1.73sq m 60 Low    ?Calcium 8.7 - 10.3 mg/dL 9.4   ?AST  8 - 39 U/L 11   ?ALT  5 - 38 U/L 8   ?Alk  Phos (alkaline Phosphatase) 34 - 104 U/L 95   ?Albumin 3.5 - 4.8 g/dL 4.3   ?Bilirubin, Total 0.3 - 1.2 mg/dL 0.4   ?Protein, Total 6.1 - 7.9 g/dL 6.5   ?A/G Ratio 1.0 - 5.0 gm/dL 2.0   ?Resulting Agency  [facility]  ?Specimen Collected: 04/18/21 09:42 Last Resulted: 04/18/21 15:01  ?Received From: [facility]  Result Received: 04/19/21 11:13  ?Lipid Panel w/calc LDL ?Specimen:  Blood ? Ref Range & Units 1 d ago  ?Cholesterol, Total 100 - 200 mg/dL 207 High    ?Triglyceride 35 - 199 mg/dL 203 High    ?HDL (High Density Lipoprotein) Cholesterol 35.0 - 85.0 mg/dL 41.4   ?LDL Calculated 0 - 130 mg/dL 125   ?VLDL Cholesterol mg/dL 41   ?Cholesterol/HDL Ratio  5.0   ?Resulting Agency  [facility]  ?Specimen Collected: 04/18/21 09:42 Last Resulted: 04/18/21 14:54  ?Received From: [facility]  Result Received: 04/19/21 11:13  ?Microalbumin/Creatinine Ratio, Random Urine ?Specimen:  Urine ? Ref  Range & Units 1 d ago Comments  ?Creatinine, Random Urine 37.0 - 250.0 mg/dL 159.0    ?Urine Albumin, Random mg/L 54    ?Urine Albumin/Creatinine Ratio <30.0 ug/mg 34.0 High   Urine:         Spot collection  ?             (?g/mg creatinine)    ? ? ?Normal               < 30  ? ? ?Moderately          30-299          ?increased  ? ? ?Clinical             >=300  ?albuminuria  ?Resulting Agency  [facility]   ?Specimen Collected: 04/18/21 09:42 Last Resulted: 04/18/21 14:49  ?Received From: [facility]  Result Received: 04/19/21 11:13  ? ?

## 2021-04-20 ENCOUNTER — Other Ambulatory Visit: Payer: Self-pay

## 2021-04-20 DIAGNOSIS — E611 Iron deficiency: Secondary | ICD-10-CM

## 2021-04-21 ENCOUNTER — Inpatient Hospital Stay: Payer: Medicare PPO | Attending: Internal Medicine

## 2021-04-21 ENCOUNTER — Inpatient Hospital Stay: Payer: Medicare PPO

## 2021-04-21 ENCOUNTER — Inpatient Hospital Stay: Payer: Medicare PPO | Admitting: Internal Medicine

## 2021-04-21 ENCOUNTER — Encounter: Payer: Self-pay | Admitting: Internal Medicine

## 2021-04-21 VITALS — BP 132/72 | HR 65 | Temp 98.4°F

## 2021-04-21 DIAGNOSIS — E611 Iron deficiency: Secondary | ICD-10-CM

## 2021-04-21 DIAGNOSIS — E538 Deficiency of other specified B group vitamins: Secondary | ICD-10-CM | POA: Insufficient documentation

## 2021-04-21 DIAGNOSIS — D509 Iron deficiency anemia, unspecified: Secondary | ICD-10-CM | POA: Diagnosis not present

## 2021-04-21 LAB — CBC WITH DIFFERENTIAL/PLATELET
Abs Immature Granulocytes: 0.01 10*3/uL (ref 0.00–0.07)
Basophils Absolute: 0 10*3/uL (ref 0.0–0.1)
Basophils Relative: 1 %
Eosinophils Absolute: 0.1 10*3/uL (ref 0.0–0.5)
Eosinophils Relative: 2 %
HCT: 34.6 % — ABNORMAL LOW (ref 36.0–46.0)
Hemoglobin: 11 g/dL — ABNORMAL LOW (ref 12.0–15.0)
Immature Granulocytes: 0 %
Lymphocytes Relative: 33 %
Lymphs Abs: 1.9 10*3/uL (ref 0.7–4.0)
MCH: 27.6 pg (ref 26.0–34.0)
MCHC: 31.8 g/dL (ref 30.0–36.0)
MCV: 86.7 fL (ref 80.0–100.0)
Monocytes Absolute: 0.5 10*3/uL (ref 0.1–1.0)
Monocytes Relative: 9 %
Neutro Abs: 3.1 10*3/uL (ref 1.7–7.7)
Neutrophils Relative %: 55 %
Platelets: 260 10*3/uL (ref 150–400)
RBC: 3.99 MIL/uL (ref 3.87–5.11)
RDW: 12.9 % (ref 11.5–15.5)
WBC: 5.6 10*3/uL (ref 4.0–10.5)
nRBC: 0 % (ref 0.0–0.2)

## 2021-04-21 LAB — IRON AND TIBC
Iron: 36 ug/dL (ref 28–170)
Saturation Ratios: 7 % — ABNORMAL LOW (ref 10.4–31.8)
TIBC: 487 ug/dL — ABNORMAL HIGH (ref 250–450)
UIBC: 451 ug/dL

## 2021-04-21 LAB — FERRITIN: Ferritin: 7 ng/mL — ABNORMAL LOW (ref 11–307)

## 2021-04-21 LAB — VITAMIN B12: Vitamin B-12: 360 pg/mL (ref 180–914)

## 2021-04-21 MED ORDER — SODIUM CHLORIDE 0.9 % IV SOLN: 200.0000 mg | Freq: Once | INTRAVENOUS | Status: DC

## 2021-04-21 MED ORDER — IRON SUCROSE 20 MG/ML IV SOLN
200.0000 mg | Freq: Once | INTRAVENOUS | Status: AC
  Administered 2021-04-21: 200 mg via INTRAVENOUS
  Filled 2021-04-21: qty 10

## 2021-04-21 MED ORDER — SODIUM CHLORIDE 0.9 % IV SOLN
Freq: Once | INTRAVENOUS | Status: AC
  Filled 2021-04-21: qty 250

## 2021-04-21 NOTE — Patient Instructions (Signed)

## 2021-04-21 NOTE — Progress Notes (Signed)
[place] ?CONSULT NOTE ? ?Patient Care Team: ?Ebbie Ridge, MD as PCP - General (Cardiology) ?Cammie Sickle, MD as Consulting Physician (Internal Medicine) ?Alice Reichert, Benay Pike, MD as Consulting Physician (Gastroenterology) ? ?CHIEF COMPLAINTS/PURPOSE OF CONSULTATION: ANEMIA ? ? ?HEMATOLOGY HISTORY: ? ?# MICROCYTIC ANEMIA [FEB 2022- Hb 9.6; Ferritin-6] > 25 years Hx of hematuria [[place]; urology]-cystoscopy;  EGD-/ colonoscopy-2022 polyps ; capsule-Bone marrow Biopsy-NONE ? ?HISTORY OF PRESENTING ILLNESS:  ?Christine Ballard 81 y.o.  female iron deficient anemia of unclear etiology is here for follow-up. ? ?Patient states that she gets short of breath on exertion.  Fatigue.  Poor tolerance to oral iron.  No nausea no vomiting. ? ?Review of Systems  ?Constitutional:  Positive for malaise/fatigue. Negative for chills, fever and weight loss.  ?HENT:  Negative for congestion, ear pain and tinnitus.   ?Eyes: Negative.  Negative for blurred vision and double vision.  ?Respiratory:  Positive for shortness of breath. Negative for cough and sputum production.   ?Cardiovascular: Negative.  Negative for chest pain, palpitations and leg swelling.  ?Gastrointestinal: Negative.  Negative for abdominal pain, constipation, diarrhea, nausea and vomiting.  ?Genitourinary:  Negative for dysuria, frequency and urgency.  ?Musculoskeletal:  Negative for back pain and falls.  ?Skin: Negative.  Negative for rash.  ?Neurological: Negative.  Negative for weakness and headaches.  ?Endo/Heme/Allergies: Negative.  Does not bruise/bleed easily.  ?Psychiatric/Behavioral: Negative.  Negative for depression. The patient is not nervous/anxious and does not have insomnia.   ? ?MEDICAL HISTORY:  ?Past Medical History:  ?Diagnosis Date  ? Allergic rhinitis   ? Allergy-induced asthma   ? seasonal allergies  ? Anxiety   ? Arthritis   ? chronic hip pain,right  ? Cancer [facility])   ? skin on back  ? Cataracts, bilateral   ? "small"  ?  Chronic kidney disease   ? mild renal insufficiency  ? Constipation   ? COPD (chronic obstructive pulmonary disease) ([place])   ? Depression   ? Dyspnea   ? GERD (gastroesophageal reflux disease)   ? Headache   ? occular  ? History of bronchitis   ? "about twice a year"  ? History of hematuria   ? Hypertension   ? Hypoglycemia   ? distant past  ? Osteopenia   ? Osteopenia   ? Torn meniscus   ? bilateral  ? Vertigo   ? 2-3x/yr  ? Wears glasses   ? ? ?SURGICAL HISTORY: ?Past Surgical History:  ?Procedure Laterality Date  ? ABDOMINAL HYSTERECTOMY    ? ANTERIOR CERVICAL DECOMP/DISCECTOMY FUSION N/A 06/26/2016  ? Procedure: Cervical Two-Three, Cervical Three-Four, Cervical Four-Five, Cervical Five-Six Anterior Discectomy with Fusion and Plate Fixation;  Surgeon: Ditty, Kevan Ny, MD;  Location: [place];  Service: Neurosurgery;  Laterality: N/A;  C3-6 Anterior discectomy with fusion and plate fixation  ? BASAL CELL CARCINOMA EXCISION    ? on back  ? BLEPHAROPLASTY    ? CARPAL TUNNEL RELEASE Bilateral   ? CATARACT EXTRACTION W/PHACO Right 02/03/2019  ? Procedure: CATARACT EXTRACTION PHACO AND INTRAOCULAR LENS PLACEMENT ([facility]) RIGHT 5.39 00:38.1;  Surgeon: Birder Robson, MD;  Location: [place];  Service: Ophthalmology;  Laterality: Right;  ? CATARACT EXTRACTION W/PHACO Left 02/24/2019  ? Procedure: CATARACT EXTRACTION PHACO AND INTRAOCULAR LENS PLACEMENT ([place])  LEFT;  Surgeon: Birder Robson, MD;  Location: [place];  Service: Ophthalmology;  Laterality: Left;  CDE 4.86 ?U/S 0:26.1  ? COLONOSCOPY    ? COLONOSCOPY WITH PROPOFOL N/A  10/16/2016  ? Procedure: COLONOSCOPY WITH PROPOFOL;  Surgeon: Lollie Sails, MD;  Location: [facility] ENDOSCOPY;  Service: Endoscopy;  Laterality: N/A;  ? COLONOSCOPY WITH PROPOFOL N/A 05/27/2020  ? Procedure: COLONOSCOPY WITH PROPOFOL;  Surgeon: [name], MD;  Location: [facility] ENDOSCOPY;  Service: Endoscopy;  Laterality: N/A;  ? ESOPHAGOGASTRODUODENOSCOPY (EGD)  WITH PROPOFOL N/A 05/27/2020  ? Procedure: ESOPHAGOGASTRODUODENOSCOPY (EGD) WITH PROPOFOL;  Surgeon: [name], MD;  Location: [facility] ENDOSCOPY;  Service: Endoscopy;  Laterality: N/A;  ? EYE SURGERY    ? NASAL SINUS SURGERY    ? ORIF WRIST FRACTURE Left 12/02/2019  ? Procedure: OPEN REDUCTION INTERNAL FIXATION (ORIF) WRIST FRACTURE;  Surgeon: Corky Mull, MD;  Location: [facility] ORS;  Service: Orthopedics;  Laterality: Left;  ? PARTIAL KNEE ARTHROPLASTY Right 05/26/2019  ? Procedure: UNICOMPARTMENTAL KNEE;  Surgeon: Corky Mull, MD;  Location: [facility] ORS;  Service: Orthopedics;  Laterality: Right;  ? REPAIR EXTENSOR TENDON Left 07/10/2018  ? Procedure: REPAIR EXTENSOR TENDON LEFT LONG FINGER REALIGNMENT;  Surgeon: Hessie Knows, MD;  Location: [facility] ORS;  Service: Orthopedics;  Laterality: Left;  ? TONSILLECTOMY    ? ? ?SOCIAL HISTORY: ?Social History  ? ?Socioeconomic History  ? Marital status: Married  ?  Spouse name: Chrissie Noa  ? Number of children: Not on file  ? Years of education: Not on file  ? Highest education level: Not on file  ?Occupational History  ? Not on file  ?Tobacco Use  ? Smoking status: Never  ? Smokeless tobacco: Never  ?Vaping Use  ? Vaping Use: Never used  ?Substance and Sexual Activity  ? Alcohol use: No  ? Drug use: No  ? Sexual activity: Not on file  ?Other Topics Concern  ? Not on file  ?Social History Narrative  ? Lives in [place] with husband. Daughter in [place]. No alcohol. Never smoked. Worked at [employer].   ? ?Social Determinants of Health  ? ?Financial Resource Strain: Not on file  ?Food Insecurity: Not on file  ?Transportation Needs: Not on file  ?Physical Activity: Not on file  ?Stress: Not on file  ?Social Connections: Not on file  ?Intimate Partner Violence: Not on file  ? ? ?FAMILY HISTORY: ?Family History  ?Problem Relation Age of Onset  ? Breast cancer Maternal Aunt   ? Breast cancer Cousin   ? ? ?ALLERGIES:  is allergic to bisacodyl,  ciprofloxacin, procaine, penicillins, and sulfa antibiotics. ? ?MEDICATIONS:  ?Current Outpatient Medications  ?Medication Sig Dispense Refill  ? acetaminophen (TYLENOL) 650 MG CR tablet Take 650 mg by mouth See admin instructions. Take 1 tablet (650 mg) by mouth scheduled twice daily, may take an additional dose at mid afternoon if needed for pain.    ? amLODipine (NORVASC) 5 MG tablet Take 2.5 mg by mouth daily.    ? Aromatic Inhalants (VICKS BABYRUB) OINT Apply 1 application topically.     ? buPROPion (WELLBUTRIN) 75 MG tablet Take 75 mg by mouth daily.    ? Calcium Carb-Cholecalciferol (CALCIUM + D3 PO) Take 1 tablet by mouth daily.    ? cetirizine (ZYRTEC) 10 MG tablet Take 10 mg by mouth daily as needed for allergies.    ? Cyanocobalamin (VITAMIN B 12) 100 MCG LOZG Take by mouth.    ? docusate sodium (COLACE) 100 MG capsule Take 100-200 mg by mouth daily as needed for mild constipation.     ? DULoxetine (CYMBALTA) 60 MG capsule Take 60 mg by mouth daily.     ?  fluticasone (FLONASE) 50 MCG/ACT nasal spray Place 1 spray into the nose daily as needed for allergies.     ? meclizine (ANTIVERT) 12.5 MG tablet Take 12.5 mg by mouth 3 (three) times daily as needed for dizziness (vertigo).     ? Menthol-Methyl Salicylate (MUSCLE RUB) 10-15 % CREA Apply 1 application topically 3 (three) times daily as needed (knee pain/arthritis.).     ? montelukast (SINGULAIR) 10 MG tablet Take 10 mg by mouth at bedtime.    ? omeprazole (PRILOSEC) 40 MG capsule Take 40 mg by mouth every evening.     ? polyethylene glycol (MIRALAX / GLYCOLAX) 17 g packet Take 17 g by mouth daily.    ? VITAMIN D PO Take 1,000 Int'l Units/day by mouth daily.    ? clindamycin (CLEOCIN) 150 MG capsule Prior to dental work    ? Dupilumab 300 MG/2ML SOPN Inject 300 mg into the skin every 14 (fourteen) days. FRIDAYS (Patient not taking: Reported on 04/21/2021)    ? ?No current facility-administered medications for this visit.  ? ? ? ? ?PHYSICAL  EXAMINATION: ? ? ?Vitals:  ? 04/21/21 1300  ?BP: 134/78  ?Pulse: 73  ?Resp: 16  ?Temp: 99.3 ?F (37.4 ?C)  ?SpO2: 97%  ? ?Filed Weights  ? 04/21/21 1300  ?Weight: 143 lb (64.9 kg)  ? ? ?Physical Exam ?Constitutional:

## 2021-04-21 NOTE — Progress Notes (Signed)
Patient tolerated Venofer infusion well, no questions/concerns voiced. Patient monitored post-transfusion, stable at discharge. AVS given.   ?

## 2021-04-21 NOTE — Progress Notes (Signed)
Patient feeling fatigued, SOBr, and dizziness all the time.  ?

## 2021-04-21 NOTE — Assessment & Plan Note (Addendum)
#  Iron deficient anemia-hemoglobin ~9/ferritin-7 [FEB 2022; PCP]; patient symptomatic.  Poor tolerance to p.o. iron. ? ?# Today- Hb-10.7 [PCP-April, 9767]; proceed with IV iron infusion today.EGD/colonosopy-  May, 2022 [Dr.Locklear]- No evidence of any GI blood loss. ? Capsule study.  ? ?# B12 low- pills once a day [PCP].   ? ?# DISPOSITION: ?# venofer today ?# venofer weekly x2 more ?# Follow up in 3 months-MD; labs- cbc/possible venofer- Dr.B ?

## 2021-04-26 ENCOUNTER — Inpatient Hospital Stay: Payer: Medicare PPO

## 2021-04-26 VITALS — BP 140/67 | HR 66 | Temp 98.4°F | Resp 17

## 2021-04-26 DIAGNOSIS — D509 Iron deficiency anemia, unspecified: Secondary | ICD-10-CM | POA: Diagnosis not present

## 2021-04-26 DIAGNOSIS — E611 Iron deficiency: Secondary | ICD-10-CM

## 2021-04-26 MED ORDER — SODIUM CHLORIDE 0.9 % IV SOLN
Freq: Once | INTRAVENOUS | Status: AC
  Filled 2021-04-26: qty 250

## 2021-04-26 MED ORDER — SODIUM CHLORIDE 0.9 % IV SOLN: 200.0000 mg | Freq: Once | INTRAVENOUS | Status: DC

## 2021-04-26 MED ORDER — IRON SUCROSE 20 MG/ML IV SOLN
200.0000 mg | Freq: Once | INTRAVENOUS | Status: AC
  Administered 2021-04-26: 200 mg via INTRAVENOUS
  Filled 2021-04-26: qty 10

## 2021-04-26 NOTE — Progress Notes (Signed)
Patient tolerated Venofer infusion well, no questions/concerns voiced. Patient stable at discharge. AVS given.   ?

## 2021-04-26 NOTE — Patient Instructions (Signed)

## 2021-04-27 ENCOUNTER — Inpatient Hospital Stay: Payer: Medicare PPO

## 2021-05-04 ENCOUNTER — Inpatient Hospital Stay: Payer: Medicare PPO | Attending: Internal Medicine

## 2021-05-04 VITALS — BP 137/71 | HR 70 | Temp 98.0°F | Resp 18

## 2021-05-04 DIAGNOSIS — E611 Iron deficiency: Secondary | ICD-10-CM

## 2021-05-04 DIAGNOSIS — D509 Iron deficiency anemia, unspecified: Secondary | ICD-10-CM | POA: Insufficient documentation

## 2021-05-04 MED ORDER — IRON SUCROSE 20 MG/ML IV SOLN
200.0000 mg | Freq: Once | INTRAVENOUS | Status: AC
  Administered 2021-05-04: 200 mg via INTRAVENOUS

## 2021-05-04 MED ORDER — SODIUM CHLORIDE 0.9 % IV SOLN
Freq: Once | INTRAVENOUS | Status: AC
  Filled 2021-05-04: qty 250

## 2021-05-04 MED ORDER — SODIUM CHLORIDE 0.9 % IV SOLN: 200.0000 mg | Freq: Once | INTRAVENOUS | Status: DC

## 2021-05-05 ENCOUNTER — Ambulatory Visit: Payer: Medicare PPO

## 2021-07-17 ENCOUNTER — Inpatient Hospital Stay: Payer: Medicare PPO | Attending: Internal Medicine

## 2021-07-17 ENCOUNTER — Encounter: Payer: Self-pay | Admitting: Oncology

## 2021-07-17 ENCOUNTER — Inpatient Hospital Stay: Payer: Medicare PPO

## 2021-07-17 ENCOUNTER — Ambulatory Visit: Payer: Medicare PPO

## 2021-07-17 ENCOUNTER — Ambulatory Visit: Payer: Medicare PPO | Admitting: Internal Medicine

## 2021-07-17 ENCOUNTER — Inpatient Hospital Stay: Payer: Medicare PPO | Admitting: Oncology

## 2021-07-17 VITALS — BP 124/61 | HR 71 | Temp 98.7°F | Resp 20 | Wt 145.5 lb

## 2021-07-17 VITALS — BP 137/67 | HR 62 | Temp 97.9°F | Resp 18

## 2021-07-17 DIAGNOSIS — E611 Iron deficiency: Secondary | ICD-10-CM

## 2021-07-17 DIAGNOSIS — E538 Deficiency of other specified B group vitamins: Secondary | ICD-10-CM | POA: Insufficient documentation

## 2021-07-17 DIAGNOSIS — D649 Anemia, unspecified: Secondary | ICD-10-CM

## 2021-07-17 DIAGNOSIS — D509 Iron deficiency anemia, unspecified: Secondary | ICD-10-CM | POA: Diagnosis present

## 2021-07-17 DIAGNOSIS — Z79899 Other long term (current) drug therapy: Secondary | ICD-10-CM | POA: Diagnosis not present

## 2021-07-17 LAB — CBC WITH DIFFERENTIAL/PLATELET
Abs Immature Granulocytes: 0.02 10*3/uL (ref 0.00–0.07)
Basophils Absolute: 0 10*3/uL (ref 0.0–0.1)
Basophils Relative: 1 %
Eosinophils Absolute: 0.1 10*3/uL (ref 0.0–0.5)
Eosinophils Relative: 2 %
HCT: 36.3 % (ref 36.0–46.0)
Hemoglobin: 11.8 g/dL — ABNORMAL LOW (ref 12.0–15.0)
Immature Granulocytes: 0 %
Lymphocytes Relative: 31 %
Lymphs Abs: 2 10*3/uL (ref 0.7–4.0)
MCH: 29.3 pg (ref 26.0–34.0)
MCHC: 32.5 g/dL (ref 30.0–36.0)
MCV: 90.1 fL (ref 80.0–100.0)
Monocytes Absolute: 0.6 10*3/uL (ref 0.1–1.0)
Monocytes Relative: 10 %
Neutro Abs: 3.8 10*3/uL (ref 1.7–7.7)
Neutrophils Relative %: 56 %
Platelets: 253 10*3/uL (ref 150–400)
RBC: 4.03 MIL/uL (ref 3.87–5.11)
RDW: 14.5 % (ref 11.5–15.5)
WBC: 6.6 10*3/uL (ref 4.0–10.5)
nRBC: 0 % (ref 0.0–0.2)

## 2021-07-17 LAB — BASIC METABOLIC PANEL
Anion gap: 3 — ABNORMAL LOW (ref 5–15)
BUN: 23 mg/dL (ref 8–23)
CO2: 24 mmol/L (ref 22–32)
Calcium: 9 mg/dL (ref 8.9–10.3)
Chloride: 109 mmol/L (ref 98–111)
Creatinine, Ser: 1.21 mg/dL — ABNORMAL HIGH (ref 0.44–1.00)
GFR, Estimated: 46 mL/min — ABNORMAL LOW (ref >60)
Glucose, Bld: 105 mg/dL — ABNORMAL HIGH (ref 70–99)
Potassium: 4.4 mmol/L (ref 3.5–5.1)
Sodium: 136 mmol/L (ref 135–145)

## 2021-07-17 MED ORDER — SODIUM CHLORIDE 0.9 % IV SOLN
200.0000 mg | Freq: Once | INTRAVENOUS | Status: AC
  Administered 2021-07-17: 200 mg via INTRAVENOUS
  Filled 2021-07-17: qty 10

## 2021-07-17 MED ORDER — SODIUM CHLORIDE 0.9 % IV SOLN
Freq: Once | INTRAVENOUS | Status: AC
  Filled 2021-07-17: qty 250

## 2021-07-17 NOTE — Progress Notes (Signed)
Christine Ballard NOTE  Patient Care Team: Ebbie Ridge, MD as PCP - General (Cardiology) Cammie Sickle, MD as Consulting Physician (Internal Medicine) Efrain Sella, MD as Consulting Physician (Gastroenterology)  CHIEF COMPLAINTS/PURPOSE OF CONSULTATION: ANEMIA   HEMATOLOGY HISTORY:  # MICROCYTIC ANEMIA [FEB 2022- Hb 9.6; Ferritin-6] > 25 years Hx of hematuria [[place]; urology]-cystoscopy;  EGD-/ colonoscopy-2022 polyps ; capsule-Bone marrow Biopsy-NONE  HISTORY OF PRESENTING ILLNESS:  Patient is here for follow-up for iron deficiency anemia of unknown origin.  Last received IV Venofer from 04/21/2021 - 05/04/2021.  Received 3 doses.  Unable to tolerate oral iron due to GI upset. Continues to feel tired all the time. Recently took in her two teenage grandchildren d/t a mold concerns at their home. Feels like that is likely contributing. Had 1-2 episodes of dizziness when she stands.   Review of Systems  Constitutional:  Positive for malaise/fatigue. Negative for chills, fever and weight loss.  HENT:  Negative for congestion, ear pain and tinnitus.   Eyes: Negative.  Negative for blurred vision and double vision.  Respiratory: Negative.  Negative for cough, sputum production and shortness of breath.   Cardiovascular: Negative.  Negative for chest pain, palpitations and leg swelling.  Gastrointestinal: Negative.  Negative for abdominal pain, constipation, diarrhea, nausea and vomiting.  Genitourinary:  Negative for dysuria, frequency and urgency.  Musculoskeletal:  Negative for back pain and falls.  Skin: Negative.  Negative for rash.  Neurological:  Positive for dizziness and weakness. Negative for headaches.  Endo/Heme/Allergies: Negative.  Does not bruise/bleed easily.  Psychiatric/Behavioral: Negative.  Negative for depression. The patient is not nervous/anxious and does not have insomnia.     MEDICAL HISTORY:  Past Medical History:  Diagnosis Date    Allergic rhinitis    Allergy-induced asthma    seasonal allergies   Anxiety    Arthritis    chronic hip pain,right   Cancer ([facility])    skin on back   Cataracts, bilateral    "small"   Chronic kidney disease    mild renal insufficiency   Constipation    COPD (chronic obstructive pulmonary disease) ([facility])    Depression    Dyspnea    GERD (gastroesophageal reflux disease)    Headache    occular   History of bronchitis    "about twice a year"   History of hematuria    Hypertension    Hypoglycemia    distant past   Osteopenia    Osteopenia    Torn meniscus    bilateral   Vertigo    2-3x/yr   Wears glasses     SURGICAL HISTORY: Past Surgical History:  Procedure Laterality Date   ABDOMINAL HYSTERECTOMY     ANTERIOR CERVICAL DECOMP/DISCECTOMY FUSION N/A 06/26/2016   Procedure: Cervical Two-Three, Cervical Three-Four, Cervical Four-Five, Cervical Five-Six Anterior Discectomy with Fusion and Plate Fixation;  Surgeon: Ditty, Kevan Ny, MD;  Location: [place];  Service: Neurosurgery;  Laterality: N/A;  C3-6 Anterior discectomy with fusion and plate fixation   BASAL CELL CARCINOMA EXCISION     on back   BLEPHAROPLASTY     CARPAL TUNNEL RELEASE Bilateral    CATARACT EXTRACTION W/PHACO Right 02/03/2019   Procedure: CATARACT EXTRACTION PHACO AND INTRAOCULAR LENS PLACEMENT ([place]) RIGHT 5.39 00:38.1;  Surgeon: Birder Robson, MD;  Location: [place];  Service: Ophthalmology;  Laterality: Right;   CATARACT EXTRACTION W/PHACO Left 02/24/2019   Procedure: CATARACT EXTRACTION PHACO AND INTRAOCULAR LENS PLACEMENT ([facility])  LEFT;  Surgeon: Birder Robson, MD;  Location: [place];  Service: Ophthalmology;  Laterality: Left;  CDE 4.86 U/S 0:26.1   COLONOSCOPY     COLONOSCOPY WITH PROPOFOL N/A 10/16/2016   Procedure: COLONOSCOPY WITH PROPOFOL;  Surgeon: Lollie Sails, MD;  Location: [facility] ENDOSCOPY;  Service: Endoscopy;  Laterality: N/A;   COLONOSCOPY WITH PROPOFOL  N/A 05/27/2020   Procedure: COLONOSCOPY WITH PROPOFOL;  Surgeon: [name], MD;  Location: [facility] ENDOSCOPY;  Service: Endoscopy;  Laterality: N/A;   ESOPHAGOGASTRODUODENOSCOPY (EGD) WITH PROPOFOL N/A 05/27/2020   Procedure: ESOPHAGOGASTRODUODENOSCOPY (EGD) WITH PROPOFOL;  Surgeon: [name], MD;  Location: [facility] ENDOSCOPY;  Service: Endoscopy;  Laterality: N/A;   EYE SURGERY     NASAL SINUS SURGERY     ORIF WRIST FRACTURE Left 12/02/2019   Procedure: OPEN REDUCTION INTERNAL FIXATION (ORIF) WRIST FRACTURE;  Surgeon: Corky Mull, MD;  Location: [facility];  Service: Orthopedics;  Laterality: Left;   PARTIAL KNEE ARTHROPLASTY Right 05/26/2019   Procedure: UNICOMPARTMENTAL KNEE;  Surgeon: Corky Mull, MD;  Location: [facility];  Service: Orthopedics;  Laterality: Right;   REPAIR EXTENSOR TENDON Left 07/10/2018   Procedure: REPAIR EXTENSOR TENDON LEFT LONG FINGER REALIGNMENT;  Surgeon: Hessie Knows, MD;  Location: [facility];  Service: Orthopedics;  Laterality: Left;   TONSILLECTOMY      SOCIAL HISTORY: Social History   Socioeconomic History   Marital status: Married    Spouse name: kenneth   Number of children: Not on file   Years of education: Not on file   Highest education level: Not on file  Occupational History   Not on file  Tobacco Use   Smoking status: Never   Smokeless tobacco: Never  Vaping Use   Vaping Use: Never used  Substance and Sexual Activity   Alcohol use: No   Drug use: No   Sexual activity: Not on file  Other Topics Concern   Not on file  Social History Narrative   Lives in [place] with husband. Daughter in [place]. No alcohol. Never smoked. Worked at [employer].    Social Determinants of Health   Financial Resource Strain: Not on file  Food Insecurity: Not on file  Transportation Needs: Not on file  Physical Activity: Not on file  Stress: Not on file  Social Connections: Not on file  Intimate Partner  Violence: Not on file    FAMILY HISTORY: Family History  Problem Relation Age of Onset   Breast cancer Maternal Aunt    Breast cancer Cousin     ALLERGIES:  is allergic to bisacodyl, ciprofloxacin, procaine, penicillins, and sulfa antibiotics.  MEDICATIONS:  Current Outpatient Medications  Medication Sig Dispense Refill   acetaminophen (TYLENOL) 650 MG CR tablet Take 650 mg by mouth See admin instructions. Take 1 tablet (650 mg) by mouth scheduled twice daily, may take an additional dose at mid afternoon if needed for pain.     amLODipine (NORVASC) 5 MG tablet Take 2.5 mg by mouth daily.     Aromatic Inhalants (VICKS BABYRUB) OINT Apply 1 application topically.      buPROPion (WELLBUTRIN) 75 MG tablet Take 75 mg by mouth daily.     cetirizine (ZYRTEC) 10 MG tablet Take 10 mg by mouth daily as needed for allergies.     clindamycin (CLEOCIN) 150 MG capsule Prior to dental work     Cyanocobalamin (VITAMIN B 12) 100 MCG LOZG Take by mouth.     docusate sodium (COLACE) 100 MG  capsule Take 100-200 mg by mouth daily as needed for mild constipation.      DULoxetine (CYMBALTA) 60 MG capsule Take 60 mg by mouth daily.      fluticasone (FLONASE) 50 MCG/ACT nasal spray Place 1 spray into the nose daily as needed for allergies.      meclizine (ANTIVERT) 12.5 MG tablet Take 12.5 mg by mouth 3 (three) times daily as needed for dizziness (vertigo).      Menthol-Methyl Salicylate (MUSCLE RUB) 10-15 % CREA Apply 1 application topically 3 (three) times daily as needed (knee pain/arthritis.).      montelukast (SINGULAIR) 10 MG tablet Take 10 mg by mouth at bedtime.     omeprazole (PRILOSEC) 40 MG capsule Take 40 mg by mouth every evening.      polyethylene glycol (MIRALAX / GLYCOLAX) 17 g packet Take 17 g by mouth daily.     VITAMIN D PO Take 1,000 Int'l Units/day by mouth daily.     Calcium Carb-Cholecalciferol (CALCIUM + D3 PO) Take 1 tablet by mouth daily.     Dupilumab 300 MG/2ML SOPN Inject 300 mg  into the skin every 14 (fourteen) days. FRIDAYS (Patient not taking: Reported on 04/21/2021)     No current facility-administered medications for this visit.      PHYSICAL EXAMINATION:   Vitals:   07/17/21 1314  BP: 124/61  Pulse: 71  Resp: 20  Temp: 98.7 F (37.1 C)  SpO2: 100%   Filed Weights   07/17/21 1314  Weight: 145 lb 8 oz (66 kg)    Physical Exam Constitutional:      Appearance: Normal appearance.  HENT:     Head: Normocephalic and atraumatic.  Eyes:     Pupils: Pupils are equal, round, and reactive to light.  Cardiovascular:     Rate and Rhythm: Normal rate and regular rhythm.     Heart sounds: Normal heart sounds. No murmur heard. Pulmonary:     Effort: Pulmonary effort is normal.     Breath sounds: Normal breath sounds. No wheezing.  Abdominal:     General: Bowel sounds are normal. There is no distension.     Palpations: Abdomen is soft.     Tenderness: There is no abdominal tenderness.  Musculoskeletal:        General: Normal range of motion.     Cervical back: Normal range of motion.  Skin:    General: Skin is warm and dry.     Findings: No rash.  Neurological:     Mental Status: She is alert and oriented to person, place, and time.     Gait: Gait is intact.  Psychiatric:        Mood and Affect: Mood and affect normal.        Cognition and Memory: Memory normal.     LABORATORY DATA:  I have reviewed the data as listed Lab Results  Component Value Date   WBC 6.6 07/17/2021   HGB 11.8 (L) 07/17/2021   HCT 36.3 07/17/2021   MCV 90.1 07/17/2021   PLT 253 07/17/2021   Recent Labs    07/17/21 1303  NA 136  K 4.4  CL 109  CO2 24  GLUCOSE 105*  BUN 23  CREATININE 1.21*  CALCIUM 9.0  GFRNONAA 46*      No results found.  No problem-specific Assessment & Plan notes found for this encounter.  Plan/Assessment:  Iron deficient anemia- Unclear etiology-malabsorption Unable to tolerate p.o. iron. She has received intermittent IV  iron infusions last given on 05/23/21 Had EGD/colonoscopy which was essentially unremarkable on 05/27/2020. Labs from today show hemoglobin of 11.8.  Proceed with 1 dose of IV Venofer. RTC in 3 months for repeat labs and assessment with possible iron  B12 deficiency- B12 level 360.  Continue oral B12 supplements.  Elevated creatinine- Creatinine 1.21. Encouraged fluids.  Discussed changing positions slowly.    Disposition- RTC in 3 months for follow-up with labs (CBC, ferritin, iron panel and B12), MD assessment and possible IV Venofer.   All questions were answered. The patient knows to call the clinic with any problems, questions or concerns.  I spent 25 minutes dedicated to the care of this patient (face-to-face and non-face-to-face) on the date of the encounter to include what is described in the assessment and plan.   Jacquelin Hawking, NP 07/17/2021 1:47 PM

## 2021-10-16 ENCOUNTER — Other Ambulatory Visit: Payer: Self-pay

## 2021-10-16 DIAGNOSIS — E611 Iron deficiency: Secondary | ICD-10-CM

## 2021-10-16 MED FILL — Iron Sucrose Inj 20 MG/ML (Fe Equiv): INTRAVENOUS | Qty: 10 | Status: AC

## 2021-10-17 ENCOUNTER — Inpatient Hospital Stay: Payer: Medicare PPO

## 2021-10-17 ENCOUNTER — Inpatient Hospital Stay: Payer: Medicare PPO | Admitting: Nurse Practitioner

## 2021-10-17 ENCOUNTER — Inpatient Hospital Stay: Payer: Medicare PPO | Attending: Internal Medicine

## 2021-10-17 ENCOUNTER — Encounter: Payer: Self-pay | Admitting: Nurse Practitioner

## 2021-10-17 VITALS — BP 119/73 | HR 69 | Temp 97.0°F | Resp 16 | Wt 141.0 lb

## 2021-10-17 DIAGNOSIS — E611 Iron deficiency: Secondary | ICD-10-CM

## 2021-10-17 DIAGNOSIS — R252 Cramp and spasm: Secondary | ICD-10-CM | POA: Insufficient documentation

## 2021-10-17 DIAGNOSIS — Z79899 Other long term (current) drug therapy: Secondary | ICD-10-CM | POA: Insufficient documentation

## 2021-10-17 DIAGNOSIS — J4489 Other specified chronic obstructive pulmonary disease: Secondary | ICD-10-CM | POA: Insufficient documentation

## 2021-10-17 DIAGNOSIS — D509 Iron deficiency anemia, unspecified: Secondary | ICD-10-CM | POA: Diagnosis present

## 2021-10-17 DIAGNOSIS — Z803 Family history of malignant neoplasm of breast: Secondary | ICD-10-CM | POA: Diagnosis not present

## 2021-10-17 DIAGNOSIS — K219 Gastro-esophageal reflux disease without esophagitis: Secondary | ICD-10-CM | POA: Diagnosis not present

## 2021-10-17 DIAGNOSIS — N189 Chronic kidney disease, unspecified: Secondary | ICD-10-CM | POA: Diagnosis not present

## 2021-10-17 DIAGNOSIS — I129 Hypertensive chronic kidney disease with stage 1 through stage 4 chronic kidney disease, or unspecified chronic kidney disease: Secondary | ICD-10-CM | POA: Insufficient documentation

## 2021-10-17 DIAGNOSIS — E538 Deficiency of other specified B group vitamins: Secondary | ICD-10-CM | POA: Diagnosis not present

## 2021-10-17 DIAGNOSIS — M8580 Other specified disorders of bone density and structure, unspecified site: Secondary | ICD-10-CM | POA: Insufficient documentation

## 2021-10-17 LAB — CBC WITH DIFFERENTIAL/PLATELET
Abs Immature Granulocytes: 0.01 10*3/uL (ref 0.00–0.07)
Basophils Absolute: 0 10*3/uL (ref 0.0–0.1)
Basophils Relative: 1 %
Eosinophils Absolute: 0.1 10*3/uL (ref 0.0–0.5)
Eosinophils Relative: 2 %
HCT: 34.6 % — ABNORMAL LOW (ref 36.0–46.0)
Hemoglobin: 11.1 g/dL — ABNORMAL LOW (ref 12.0–15.0)
Immature Granulocytes: 0 %
Lymphocytes Relative: 30 %
Lymphs Abs: 1.6 10*3/uL (ref 0.7–4.0)
MCH: 29 pg (ref 26.0–34.0)
MCHC: 32.1 g/dL (ref 30.0–36.0)
MCV: 90.3 fL (ref 80.0–100.0)
Monocytes Absolute: 0.5 10*3/uL (ref 0.1–1.0)
Monocytes Relative: 10 %
Neutro Abs: 3.1 10*3/uL (ref 1.7–7.7)
Neutrophils Relative %: 57 %
Platelets: 241 10*3/uL (ref 150–400)
RBC: 3.83 MIL/uL — ABNORMAL LOW (ref 3.87–5.11)
RDW: 13.7 % (ref 11.5–15.5)
WBC: 5.4 10*3/uL (ref 4.0–10.5)
nRBC: 0 % (ref 0.0–0.2)

## 2021-10-17 LAB — IRON AND TIBC
Iron: 63 ug/dL (ref 28–170)
Saturation Ratios: 14 % (ref 10.4–31.8)
TIBC: 437 ug/dL (ref 250–450)
UIBC: 374 ug/dL

## 2021-10-17 LAB — FERRITIN: Ferritin: 12 ng/mL (ref 11–307)

## 2021-10-17 LAB — VITAMIN B12: Vitamin B-12: 635 pg/mL (ref 180–914)

## 2021-10-17 MED ORDER — SODIUM CHLORIDE 0.9 % IV SOLN
200.0000 mg | Freq: Once | INTRAVENOUS | Status: AC
  Administered 2021-10-17: 200 mg via INTRAVENOUS
  Filled 2021-10-17: qty 200

## 2021-10-17 MED ORDER — SODIUM CHLORIDE 0.9 % IV SOLN
Freq: Once | INTRAVENOUS | Status: AC
  Filled 2021-10-17: qty 250

## 2021-10-17 NOTE — Patient Instructions (Signed)
[facility]  Discharge Instructions: Thank you for choosing [facility] to provide your oncology and hematology care.  If you have a lab appointment with the Cancer Center, please go directly to the Cancer Center and check in at the registration area.  Wear comfortable clothing and clothing appropriate for easy access to any Portacath or PICC line.   We strive to give you quality time with your provider. You may need to reschedule your appointment if you arrive late (15 or more minutes).  Arriving late affects you and other patients whose appointments are after yours.  Also, if you miss three or more appointments without notifying the office, you may be dismissed from the clinic at the provider's discretion.      For prescription refill requests, have your pharmacy contact our office and allow 72 hours for refills to be completed.    Today you received the following chemotherapy and/or immunotherapy agents venofer      To help prevent nausea and vomiting after your treatment, we encourage you to take your nausea medication as directed.  BELOW ARE SYMPTOMS THAT SHOULD BE REPORTED IMMEDIATELY: *FEVER GREATER THAN 100.4 F (38 C) OR HIGHER *CHILLS OR SWEATING *NAUSEA AND VOMITING THAT IS NOT CONTROLLED WITH YOUR NAUSEA MEDICATION *UNUSUAL SHORTNESS OF BREATH *UNUSUAL BRUISING OR BLEEDING *URINARY PROBLEMS (pain or burning when urinating, or frequent urination) *BOWEL PROBLEMS (unusual diarrhea, constipation, pain near the anus) TENDERNESS IN MOUTH AND THROAT WITH OR WITHOUT PRESENCE OF ULCERS (sore throat, sores in mouth, or a toothache) UNUSUAL RASH, SWELLING OR PAIN  UNUSUAL VAGINAL DISCHARGE OR ITCHING   Items with * indicate a potential emergency and should be followed up as soon as possible or go to the Emergency Department if any problems should occur.  Please show the CHEMOTHERAPY ALERT CARD or IMMUNOTHERAPY ALERT CARD at check-in to the  Emergency Department and triage nurse.  Should you have questions after your visit or need to cancel or reschedule your appointment, please contact [facility]  [phone] and follow the prompts.  Office hours are 8:00 a.m. to 4:30 p.m. Monday - Friday. Please note that voicemails left after 4:00 p.m. may not be returned until the following business day.  We are closed weekends and major holidays. You have access to a nurse at all times for urgent questions. Please call the main number to the clinic [phone] and follow the prompts.  For any non-urgent questions, you may also contact your provider using MyChart. We now offer e-Visits for anyone 18 and older to request care online for non-urgent symptoms. For details visit [email or web address].   Also download the MyChart app! Go to the app store, search "MyChart", open the app, select [facility], and log in with your MyChart username and password.  Masks are optional in the cancer centers. If you would like for your care team to wear a mask while they are taking care of you, please let them know. For doctor visits, patients may have with them one support person who is at least 80 years old. At this time, visitors are not allowed in the infusion area.  Iron Sucrose Injection What is this medication? IRON SUCROSE (EYE ern SOO krose) treats low levels of iron (iron deficiency anemia) in people with kidney disease. Iron is a mineral that plays an important role in making red blood cells, which carry oxygen from your lungs to the rest of your body. This medicine may be   used for other purposes; ask your health care provider or pharmacist if you have questions. COMMON BRAND NAME(S): Venofer What should I tell my care team before I take this medication? They need to know if you have any of these conditions: Anemia not caused by low iron levels Heart disease High levels of iron in the blood Kidney disease Liver  disease An unusual or allergic reaction to iron, other medications, foods, dyes, or preservatives Pregnant or trying to get pregnant Breast-feeding How should I use this medication? This medication is for infusion into a vein. It is given in a hospital or clinic setting. Talk to your care team about the use of this medication in children. While this medication may be prescribed for children as young as 2 years for selected conditions, precautions do apply. Overdosage: If you think you have taken too much of this medicine contact a poison control center or emergency room at once. NOTE: This medicine is only for you. Do not share this medicine with others. What if I miss a dose? It is important not to miss your dose. Call your care team if you are unable to keep an appointment. What may interact with this medication? Do not take this medication with any of the following: Deferoxamine Dimercaprol Other iron products This medication may also interact with the following: Chloramphenicol Deferasirox This list may not describe all possible interactions. Give your health care provider a list of all the medicines, herbs, non-prescription drugs, or dietary supplements you use. Also tell them if you smoke, drink alcohol, or use illegal drugs. Some items may interact with your medicine. What should I watch for while using this medication? Visit your care team regularly. Tell your care team if your symptoms do not start to get better or if they get worse. You may need blood work done while you are taking this medication. You may need to follow a special diet. Talk to your care team. Foods that contain iron include: whole grains/cereals, dried fruits, beans, or peas, leafy green vegetables, and organ meats (liver, kidney). What side effects may I notice from receiving this medication? Side effects that you should report to your care team as soon as possible: Allergic reactions--skin rash, itching, hives,  swelling of the face, lips, tongue, or throat Low blood pressure--dizziness, feeling faint or lightheaded, blurry vision Shortness of breath Side effects that usually do not require medical attention (report to your care team if they continue or are bothersome): Flushing Headache Joint pain Muscle pain Nausea Pain, redness, or irritation at injection site This list may not describe all possible side effects. Call your doctor for medical advice about side effects. You may report side effects to FDA at 1-800-FDA-1088. Where should I keep my medication? This medication is given in a hospital or clinic and will not be stored at home. NOTE: This sheet is a summary. It may not cover all possible information. If you have questions about this medicine, talk to your doctor, pharmacist, or health care provider.  2023 Elsevier/Gold Standard (2007-02-08 00:00:00)

## 2021-10-17 NOTE — Progress Notes (Signed)
[facility] NOTE  Patient Care Team: Ebbie Ridge, MD as PCP - General (Cardiology) Cammie Sickle, MD as Consulting Physician (Internal Medicine) Efrain Sella, MD as Consulting Physician (Gastroenterology)  CHIEF COMPLAINTS/PURPOSE OF CONSULTATION: ANEMIA  HEMATOLOGY HISTORY:  # MICROCYTIC ANEMIA [FEB 2022- Hb 9.6; Ferritin-6] > 25 years Hx of hematuria [[place]; urology]-cystoscopy;  EGD-/ colonoscopy-2022 polyps ; capsule-Bone marrow Biopsy-NONE  HISTORY OF PRESENTING ILLNESS:  Christine Ballard 81 y.o.  female iron deficient anemia who returns to clinic for follow up and consideration of IV venofer. She has ongoing shortness of breath with exertion and fatigue. Reports leg cramps and ice cravings. Her son has been ill and her son and teenage grandsons have moved in with her.   Review of Systems  Constitutional:  Positive for malaise/fatigue. Negative for chills, fever and weight loss.  HENT:  Negative for congestion, ear pain and tinnitus.   Eyes:  Negative for blurred vision and double vision.  Respiratory:  Positive for shortness of breath. Negative for cough and sputum production.   Cardiovascular:  Negative for chest pain, palpitations and leg swelling.  Gastrointestinal:  Negative for abdominal pain, constipation, diarrhea, nausea and vomiting.  Genitourinary:  Negative for dysuria, frequency and urgency.  Musculoskeletal:  Positive for myalgias. Negative for back pain and falls.  Skin:  Negative for rash.  Neurological:  Negative for weakness and headaches.  Endo/Heme/Allergies:  Does not bruise/bleed easily.  Psychiatric/Behavioral:  Negative for depression. The patient is not nervous/anxious and does not have insomnia.     MEDICAL HISTORY:  Past Medical History:  Diagnosis Date   Allergic rhinitis    Allergy-induced asthma    seasonal allergies   Anxiety    Arthritis    chronic hip pain,right   Cancer (HCC)    skin on back    Cataracts, bilateral    "small"   Chronic kidney disease    mild renal insufficiency   Constipation    COPD (chronic obstructive pulmonary disease) (HCC)    Depression    Dyspnea    GERD (gastroesophageal reflux disease)    Headache    occular   History of bronchitis    "about twice a year"   History of hematuria    Hypertension    Hypoglycemia    distant past   Osteopenia    Osteopenia    Torn meniscus    bilateral   Vertigo    2-3x/yr   Wears glasses     SURGICAL HISTORY: Past Surgical History:  Procedure Laterality Date   ABDOMINAL HYSTERECTOMY     ANTERIOR CERVICAL DECOMP/DISCECTOMY FUSION N/A 06/26/2016   Procedure: Cervical Two-Three, Cervical Three-Four, Cervical Four-Five, Cervical Five-Six Anterior Discectomy with Fusion and Plate Fixation;  Surgeon: Ditty, Kevan Ny, MD;  Location: [place];  Service: Neurosurgery;  Laterality: N/A;  C3-6 Anterior discectomy with fusion and plate fixation   BASAL CELL CARCINOMA EXCISION     on back   BLEPHAROPLASTY     CARPAL TUNNEL RELEASE Bilateral    CATARACT EXTRACTION W/PHACO Right 02/03/2019   Procedure: CATARACT EXTRACTION PHACO AND INTRAOCULAR LENS PLACEMENT ([place]) RIGHT 5.39 00:38.1;  Surgeon: Birder Robson, MD;  Location: [place];  Service: Ophthalmology;  Laterality: Right;   CATARACT EXTRACTION W/PHACO Left 02/24/2019   Procedure: CATARACT EXTRACTION PHACO AND INTRAOCULAR LENS PLACEMENT ([place])  LEFT;  Surgeon: Birder Robson, MD;  Location: [place];  Service: Ophthalmology;  Laterality: Left;  CDE 4.86 U/S 0:26.1  COLONOSCOPY     COLONOSCOPY WITH PROPOFOL N/A 10/16/2016   Procedure: COLONOSCOPY WITH PROPOFOL;  Surgeon: Lollie Sails, MD;  Location: [facility];  Service: Endoscopy;  Laterality: N/A;   COLONOSCOPY WITH PROPOFOL N/A 05/27/2020   Procedure: COLONOSCOPY WITH PROPOFOL;  Surgeon: [name], MD;  Location: [facility]COPY;  Service: Endoscopy;  Laterality: N/A;    ESOPHAGOGASTRODUODENOSCOPY (EGD) WITH PROPOFOL N/A 05/27/2020   Procedure: ESOPHAGOGASTRODUODENOSCOPY (EGD) WITH PROPOFOL;  Surgeon: [name], MD;  Location: [facility] END[facility]COPY;  Service: Endoscopy;  Laterality: N/A;   EYE SURGERY     NASAL SINUS SURGERY     ORIF WRIST FRACTURE Left 12/02/2019   Procedure: OPEN REDUCTION INTERNAL FIXATION (ORIF) WRIST FRACTURE;  Surgeon: Corky Mull, MD;  Location: [facility];  Service: Orthopedics;  Laterality: Left;   PARTIAL KNEE ARTHROPLASTY Right 05/26/2019   Procedure: UNICOMPARTMENTAL KNEE;  Surgeon: Corky Mull, MD;  Location: [facility];  Service: Orthopedics;  Laterality: Right;   REPAIR EXTENSOR TENDON Left 07/10/2018   Procedure: REPAIR EXTENSOR TENDON LEFT LONG FINGER REALIGNMENT;  Surgeon: Hessie Knows, MD;  Location: [facility];  Service: Orthopedics;  Laterality: Left;   TONSILLECTOMY      SOCIAL HISTORY: Social History   Socioeconomic History   Marital status: Married    Spouse name: kenneth   Number of children: Not on file   Years of education: Not on file   Highest education level: Not on file  Occupational History   Not on file  Tobacco Use   Smoking status: Never   Smokeless tobacco: Never  Vaping Use   Vaping Use: Never used  Substance and Sexual Activity   Alcohol use: No   Drug use: No   Sexual activity: Not on file  Other Topics Concern   Not on file  Social History Narrative   Lives in [place] with husband. Daughter in [place]. No alcohol. Never smoked. Worked at [employer].    Social Determinants of Health   Financial Resource Strain: Not on file  Food Insecurity: Not on file  Transportation Needs: Not on file  Physical Activity: Not on file  Stress: Not on file  Social Connections: Not on file  Intimate Partner Violence: Not on file    FAMILY HISTORY: Family History  Problem Relation Age of Onset   Breast cancer Maternal Aunt    Breast cancer Cousin      ALLERGIES:  is allergic to bisacodyl, ciprofloxacin, procaine, penicillins, and sulfa antibiotics.  MEDICATIONS:  Current Outpatient Medications  Medication Sig Dispense Refill   acetaminophen (TYLENOL) 650 MG CR tablet Take 650 mg by mouth See admin instructions. Take 1 tablet (650 mg) by mouth scheduled twice daily, may take an additional dose at mid afternoon if needed for pain.     amLODipine (NORVASC) 5 MG tablet Take 2.5 mg by mouth daily.     Aromatic Inhalants (VICKS BABYRUB) OINT Apply 1 application topically.      buPROPion (WELLBUTRIN) 75 MG tablet Take 75 mg by mouth daily.     cetirizine (ZYRTEC) 10 MG tablet Take 10 mg by mouth daily as needed for allergies.     Cyanocobalamin (VITAMIN B 12) 100 MCG LOZG Take by mouth.     docusate sodium (COLACE) 100 MG capsule Take 100-200 mg by mouth daily as needed for mild constipation.      DULoxetine (CYMBALTA) 60 MG capsule Take 60 mg by mouth daily.      fluticasone (FLONASE)  50 MCG/ACT nasal spray Place 1 spray into the nose daily as needed for allergies.      meclizine (ANTIVERT) 12.5 MG tablet Take 12.5 mg by mouth 3 (three) times daily as needed for dizziness (vertigo).      Menthol-Methyl Salicylate (MUSCLE RUB) 10-15 % CREA Apply 1 application topically 3 (three) times daily as needed (knee pain/arthritis.).      montelukast (SINGULAIR) 10 MG tablet Take 10 mg by mouth at bedtime.     omeprazole (PRILOSEC) 40 MG capsule Take 40 mg by mouth every evening.      polyethylene glycol (MIRALAX / GLYCOLAX) 17 g packet Take 17 g by mouth daily.     traMADol (ULTRAM) 50 MG tablet Take 50 mg by mouth every 6 (six) hours as needed.     VITAMIN D PO Take 1,000 Int'l Units/day by mouth daily.     clindamycin (CLEOCIN) 150 MG capsule Prior to dental work (Patient not taking: Reported on 10/17/2021)     No current facility-administered medications for this visit.     PHYSICAL EXAMINATION: Vitals:   10/17/21 1345  BP: 119/73  Pulse:  69  Resp: 16  Temp: (!) 97 F (36.1 C)  SpO2: 99%   Filed Weights   10/17/21 1345  Weight: 141 lb (64 kg)    Physical Exam Constitutional:      Appearance: She is not ill-appearing.  Cardiovascular:     Rate and Rhythm: Normal rate and regular rhythm.     Heart sounds: No murmur heard. Pulmonary:     Effort: Pulmonary effort is normal.     Breath sounds: Normal breath sounds. No wheezing.  Abdominal:     General: Bowel sounds are normal. There is no distension.     Palpations: Abdomen is soft.     Tenderness: There is no abdominal tenderness.  Skin:    General: Skin is warm and dry.     Coloration: Skin is pale.  Neurological:     Mental Status: She is alert and oriented to person, place, and time.     Gait: Gait is intact.  Psychiatric:        Mood and Affect: Mood and affect normal.        Behavior: Behavior normal.        Cognition and Memory: Memory normal.     LABORATORY DATA:  I have reviewed the data as listed    Latest Ref Rng & Units 10/17/2021    1:23 PM 07/17/2021    1:03 PM 04/21/2021   12:40 PM  CBC  WBC 4.0 - 10.5 K/uL 5.4  6.6  5.6   Hemoglobin 12.0 - 15.0 g/dL 11.1  11.8  11.0   Hematocrit 36.0 - 46.0 % 34.6  36.3  34.6   Platelets 150 - 400 K/uL 241  253  260    Iron/TIBC/Ferritin/ %Sat    Component Value Date/Time   IRON 36 04/21/2021 1240   TIBC 487 (H) 04/21/2021 1240   FERRITIN 7 (L) 04/21/2021 1240   IRONPCTSAT 7 (L) 04/21/2021 1240   No results found.  No problem-specific Assessment & Plan notes found for this encounter.  Plan/Assessment:  Iron deficient anemia- hmg ~ 9, ferritin 08 February 2020 with pcp. Symptomatic. Poor tolerance to oral iron. Previously had EGD, colonoscopy in May 2022 with Dr. Haig Prophet. No evidence of GI blood loss. ? Capsule study. Last Venofer x 1 in July 2023. Hmg has improved to 11.1. Persistent symptoms. Proceed with venofer today.  Additional doses based on ferritin and iron studies which are still  pending.   B12 deficiency- on oral b12 per pcp. Levels today are pending. Goal > 500.     Disposition-  Proceed with IV Venofer today 3 mo- labs (cbc, ferritin, iron studies), Dr Rogue Bussing or APP, possible venofer- la   All questions were answered. The patient knows to call the clinic with any problems, questions or concerns.  Verlon Au, NP 10/17/2021

## 2021-10-17 NOTE — Progress Notes (Signed)
Pt in for follow up, reports energy levels are some improved but still fatigued at times.

## 2021-10-27 ENCOUNTER — Other Ambulatory Visit: Payer: Self-pay | Admitting: Infectious Diseases

## 2021-10-27 DIAGNOSIS — Z1231 Encounter for screening mammogram for malignant neoplasm of breast: Secondary | ICD-10-CM

## 2021-12-04 ENCOUNTER — Ambulatory Visit
Admission: RE | Admit: 2021-12-04 | Discharge: 2021-12-04 | Disposition: A | Discharge status: Home / Self Care | Payer: Medicare PPO | Source: Ambulatory Visit | Attending: Infectious Diseases | Admitting: Infectious Diseases

## 2021-12-04 DIAGNOSIS — Z1231 Encounter for screening mammogram for malignant neoplasm of breast: Secondary | ICD-10-CM | POA: Diagnosis present

## 2022-01-17 ENCOUNTER — Inpatient Hospital Stay: Payer: Medicare PPO

## 2022-01-17 ENCOUNTER — Inpatient Hospital Stay: Payer: Medicare PPO | Attending: Internal Medicine

## 2022-01-17 ENCOUNTER — Inpatient Hospital Stay: Payer: Medicare PPO | Admitting: Internal Medicine

## 2022-01-17 ENCOUNTER — Encounter: Payer: Self-pay | Admitting: Internal Medicine

## 2022-01-17 VITALS — BP 148/79 | HR 64 | Temp 98.1°F | Resp 16 | Wt 141.3 lb

## 2022-01-17 DIAGNOSIS — E611 Iron deficiency: Secondary | ICD-10-CM

## 2022-01-17 DIAGNOSIS — D509 Iron deficiency anemia, unspecified: Secondary | ICD-10-CM | POA: Insufficient documentation

## 2022-01-17 DIAGNOSIS — R109 Unspecified abdominal pain: Secondary | ICD-10-CM | POA: Diagnosis not present

## 2022-01-17 DIAGNOSIS — R1011 Right upper quadrant pain: Secondary | ICD-10-CM | POA: Diagnosis not present

## 2022-01-17 LAB — CBC WITH DIFFERENTIAL/PLATELET
Abs Immature Granulocytes: 0.01 10*3/uL (ref 0.00–0.07)
Basophils Absolute: 0 10*3/uL (ref 0.0–0.1)
Basophils Relative: 1 %
Eosinophils Absolute: 0.1 10*3/uL (ref 0.0–0.5)
Eosinophils Relative: 2 %
HCT: 33.9 % — ABNORMAL LOW (ref 36.0–46.0)
Hemoglobin: 10.8 g/dL — ABNORMAL LOW (ref 12.0–15.0)
Immature Granulocytes: 0 %
Lymphocytes Relative: 31 %
Lymphs Abs: 1.7 10*3/uL (ref 0.7–4.0)
MCH: 28.4 pg (ref 26.0–34.0)
MCHC: 31.9 g/dL (ref 30.0–36.0)
MCV: 89.2 fL (ref 80.0–100.0)
Monocytes Absolute: 0.6 10*3/uL (ref 0.1–1.0)
Monocytes Relative: 11 %
Neutro Abs: 3 10*3/uL (ref 1.7–7.7)
Neutrophils Relative %: 55 %
Platelets: 253 10*3/uL (ref 150–400)
RBC: 3.8 MIL/uL — ABNORMAL LOW (ref 3.87–5.11)
RDW: 13.1 % (ref 11.5–15.5)
WBC: 5.4 10*3/uL (ref 4.0–10.5)
nRBC: 0 % (ref 0.0–0.2)

## 2022-01-17 LAB — IRON AND TIBC
Iron: 78 ug/dL (ref 28–170)
Saturation Ratios: 16 % (ref 10.4–31.8)
TIBC: 480 ug/dL — ABNORMAL HIGH (ref 250–450)
UIBC: 402 ug/dL

## 2022-01-17 LAB — VITAMIN B12: Vitamin B-12: 457 pg/mL (ref 180–914)

## 2022-01-17 LAB — FERRITIN: Ferritin: 8 ng/mL — ABNORMAL LOW (ref 11–307)

## 2022-01-17 MED ORDER — SODIUM CHLORIDE 0.9 % IV SOLN
200.0000 mg | Freq: Once | INTRAVENOUS | Status: AC
  Administered 2022-01-17: 200 mg via INTRAVENOUS
  Filled 2022-01-17: qty 200

## 2022-01-17 MED ORDER — SODIUM CHLORIDE 0.9 % IV SOLN
Freq: Once | INTRAVENOUS | Status: AC
  Filled 2022-01-17: qty 250

## 2022-01-17 NOTE — Progress Notes (Signed)
Patient having issues with balance for a while.

## 2022-01-17 NOTE — Assessment & Plan Note (Addendum)
#  Iron deficient anemia-hemoglobin ~9/ferritin-7 [FEB 2022; PCP]; patient symptomatic.  Poor tolerance to p.o. iron.  # Today- Hb-10.8  proceed with IV iron infusion today.   # Etiology of iron deficiency anemia -unclear.EGD/colonosopy-  May, 2022 [Dr.Locklear]- No evidence of any GI blood loss. Discussed re:  Capsule study.  Discussed with Dr.Locklear/   # right flank pain- for last 3-4 months-progressively getting worse.  Previous UA negative for blood.  Question stone versus others including malignancy.  Check stat CT stone protocol.  Discussed with the patient.  # B12 low- pills once a day [PCP].    # DISPOSITION: # STAT CT scan  # venofer today # venofer weekly x2 more # Follow up in 3 months-MD; labs- cbc/bmp-possible venofer- Dr.B

## 2022-01-17 NOTE — Patient Instructions (Signed)
Iron Sucrose Injection What is this medication? IRON SUCROSE (EYE ern SOO krose) treats low levels of iron (iron deficiency anemia) in people with kidney disease. Iron is a mineral that plays an important role in making red blood cells, which carry oxygen from your lungs to the rest of your body. This medicine may be used for other purposes; ask your health care provider or pharmacist if you have questions. COMMON BRAND NAME(S): Venofer What should I tell my care team before I take this medication? They need to know if you have any of these conditions: Anemia not caused by low iron levels Heart disease High levels of iron in the blood Kidney disease Liver disease An unusual or allergic reaction to iron, other medications, foods, dyes, or preservatives Pregnant or trying to get pregnant Breastfeeding How should I use this medication? This medication is for infusion into a vein. It is given in a hospital or clinic setting. Talk to your care team about the use of this medication in children. While this medication may be prescribed for children as young as 2 years for selected conditions, precautions do apply. Overdosage: If you think you have taken too much of this medicine contact a poison control center or emergency room at once. NOTE: This medicine is only for you. Do not share this medicine with others. What if I miss a dose? Keep appointments for follow-up doses. It is important not to miss your dose. Call your care team if you are unable to keep an appointment. What may interact with this medication? Do not take this medication with any of the following: Deferoxamine Dimercaprol Other iron products This medication may also interact with the following: Chloramphenicol Deferasirox This list may not describe all possible interactions. Give your health care provider a list of all the medicines, herbs, non-prescription drugs, or dietary supplements you use. Also tell them if you smoke,  drink alcohol, or use illegal drugs. Some items may interact with your medicine. What should I watch for while using this medication? Visit your care team regularly. Tell your care team if your symptoms do not start to get better or if they get worse. You may need blood work done while you are taking this medication. You may need to follow a special diet. Talk to your care team. Foods that contain iron include: whole grains/cereals, dried fruits, beans, or peas, leafy green vegetables, and organ meats (liver, kidney). What side effects may I notice from receiving this medication? Side effects that you should report to your care team as soon as possible: Allergic reactions--skin rash, itching, hives, swelling of the face, lips, tongue, or throat Low blood pressure--dizziness, feeling faint or lightheaded, blurry vision Shortness of breath Side effects that usually do not require medical attention (report to your care team if they continue or are bothersome): Flushing Headache Joint pain Muscle pain Nausea Pain, redness, or irritation at injection site This list may not describe all possible side effects. Call your doctor for medical advice about side effects. You may report side effects to FDA at 1-800-FDA-1088. Where should I keep my medication? This medication is given in a hospital or clinic and will not be stored at home. NOTE: This sheet is a summary. It may not cover all possible information. If you have questions about this medicine, talk to your doctor, pharmacist, or health care provider.  2023 Elsevier/Gold Standard (2020-03-31 00:00:00)

## 2022-01-17 NOTE — Progress Notes (Signed)
[place] NOTE  Patient Care Team: Ebbie Ridge, MD as PCP - General (Cardiology) Cammie Sickle, MD as Consulting Physician (Internal Medicine) Efrain Sella, MD as Consulting Physician (Gastroenterology)  CHIEF COMPLAINTS/PURPOSE OF CONSULTATION: ANEMIA   HEMATOLOGY HISTORY:  # MICROCYTIC ANEMIA [FEB 2022- Hb 9.6; Ferritin-6] > 25 years Hx of hematuria [[place]; urology]-cystoscopy;  EGD-/ colonoscopy-2022 polyps ; capsule-Bone marrow Biopsy-NONE  HISTORY OF PRESENTING ILLNESS: Ambulating independently.  Accompanied by husband-Parkinson  Christine Ballard 82 y.o.  female iron deficient anemia of unclear etiology is here for follow-up.  Patient complains of worsening right flank pain for the last 3 to 4 months.  It is tender especially with movement.  Denies any blood in urine.   Patient states that she gets short of breath on exertion.  Fatigue.  Poor tolerance to oral iron.  No nausea no vomiting.  Mild weight loss otherwise appetite is fair.  No nausea no vomiting.  Review of Systems  Constitutional:  Positive for malaise/fatigue. Negative for chills, fever and weight loss.  HENT:  Negative for congestion, ear pain and tinnitus.   Eyes: Negative.  Negative for blurred vision and double vision.  Respiratory:  Positive for shortness of breath. Negative for cough and sputum production.   Cardiovascular: Negative.  Negative for chest pain, palpitations and leg swelling.  Gastrointestinal: Negative.  Negative for abdominal pain, constipation, diarrhea, nausea and vomiting.  Genitourinary:  Positive for flank pain. Negative for dysuria, frequency and urgency.  Musculoskeletal:  Negative for back pain and falls.  Skin: Negative.  Negative for rash.  Neurological: Negative.  Negative for weakness and headaches.  Endo/Heme/Allergies: Negative.  Does not bruise/bleed easily.  Psychiatric/Behavioral: Negative.  Negative for depression. The patient is  not nervous/anxious and does not have insomnia.     MEDICAL HISTORY:  Past Medical History:  Diagnosis Date  . Allergic rhinitis   . Allergy-induced asthma    seasonal allergies  . Anxiety   . Arthritis    chronic hip pain,right  . Cancer ([place])    skin on back  . Cataracts, bilateral    "small"  . Chronic kidney disease    mild renal insufficiency  . Constipation   . COPD (chronic obstructive pulmonary disease) ([place])   . Depression   . Dyspnea   . GERD (gastroesophageal reflux disease)   . Headache    occular  . History of bronchitis    "about twice a year"  . History of hematuria   . Hypertension   . Hypoglycemia    distant past  . Osteopenia   . Osteopenia   . Torn meniscus    bilateral  . Vertigo    2-3x/yr  . Wears glasses     SURGICAL HISTORY: Past Surgical History:  Procedure Laterality Date  . ABDOMINAL HYSTERECTOMY    . ANTERIOR CERVICAL DECOMP/DISCECTOMY FUSION N/A 06/26/2016   Procedure: Cervical Two-Three, Cervical Three-Four, Cervical Four-Five, Cervical Five-Six Anterior Discectomy with Fusion and Plate Fixation;  Surgeon: Ditty, Kevan Ny, MD;  Location: [facility];  Service: Neurosurgery;  Laterality: N/A;  C3-6 Anterior discectomy with fusion and plate fixation  . BASAL CELL CARCINOMA EXCISION     on back  . BLEPHAROPLASTY    . CARPAL TUNNEL RELEASE Bilateral   . CATARACT EXTRACTION W/PHACO Right 02/03/2019   Procedure: CATARACT EXTRACTION PHACO AND INTRAOCULAR LENS PLACEMENT (IOC) RIGHT 5.39 00:38.1;  Surgeon: Birder Robson, MD;  Location: [facility];  Service: Ophthalmology;  Laterality:  Right;  Marland Kitchen CATARACT EXTRACTION W/PHACO Left 02/24/2019   Procedure: CATARACT EXTRACTION PHACO AND INTRAOCULAR LENS PLACEMENT ([place])  LEFT;  Surgeon: Birder Robson, MD;  Location: [facility];  Service: Ophthalmology;  Laterality: Left;  CDE 4.86 U/S 0:26.1  . COLONOSCOPY    . COLONOSCOPY WITH PROPOFOL N/A 10/16/2016   Procedure: COLONOSCOPY  WITH PROPOFOL;  Surgeon: Lollie Sails, MD;  Location[facility] ENDOSCOPY;  Service: Endoscopy;  Laterality: N/A;  . COLONOSCOPY WITH PROPOFOL N/A 05/27/2020   Procedure: COLONOSCOPY WITH PROPOFOL;  Surgeon: [name], MD;  Location: [facility] ENDOSCOPY;  Service: Endoscopy;  Laterality: N/A;  . ESOPHAGOGASTRODUODENOSCOPY (EGD) WITH PROPOFOL N/A 05/27/2020   Procedure: ESOPHAGOGASTRODUODENOSCOPY (EGD) WITH PROPOFOL;  Surgeon: [name], MD;  Location: [facility] ENDOSCOPY;  Service: Endoscopy;  Laterality: N/A;  . EYE SURGERY    . NASAL SINUS SURGERY    . ORIF WRIST FRACTURE Left 12/02/2019   Procedure: OPEN REDUCTION INTERNAL FIXATION (ORIF) WRIST FRACTURE;  Surgeon: Corky Mull, MD;  Location: [facility];  Service: Orthopedics;  Laterality: Left;  . PARTIAL KNEE ARTHROPLASTY Right 05/26/2019   Procedure: UNICOMPARTMENTAL KNEE;  Surgeon: Corky Mull, MD;  Location: [facility];  Service: Orthopedics;  Laterality: Right;  . REPAIR EXTENSOR TENDON Left 07/10/2018   Procedure: REPAIR EXTENSOR TENDON LEFT LONG FINGER REALIGNMENT;  Surgeon: Hessie Knows, MD;  Location: [facility];  Service: Orthopedics;  Laterality: Left;  . TONSILLECTOMY      SOCIAL HISTORY: Social History   Socioeconomic History  . Marital status: Married    Spouse name: Chrissie Noa  . Number of children: Not on file  . Years of education: Not on file  . Highest education level: Not on file  Occupational History  . Not on file  Tobacco Use  . Smoking status: Never  . Smokeless tobacco: Never  Vaping Use  . Vaping Use: Never used  Substance and Sexual Activity  . Alcohol use: No  . Drug use: No  . Sexual activity: Not on file  Other Topics Concern  . Not on file  Social History Narrative   Lives in [place] with husband. Daughter in [place]. No alcohol. Never smoked. Worked at [employer].    Social Determinants of Health   Financial Resource Strain: Not on file  Food  Insecurity: Not on file  Transportation Needs: Not on file  Physical Activity: Not on file  Stress: Not on file  Social Connections: Not on file  Intimate Partner Violence: Not on file    FAMILY HISTORY: Family History  Problem Relation Age of Onset  . Breast cancer Maternal Aunt   . Breast cancer Cousin     ALLERGIES:  is allergic to bisacodyl, ciprofloxacin, procaine, penicillins, and sulfa antibiotics.  MEDICATIONS:  Current Outpatient Medications  Medication Sig Dispense Refill  . acetaminophen (TYLENOL) 650 MG CR tablet Take 650 mg by mouth See admin instructions. Take 1 tablet (650 mg) by mouth scheduled twice daily, may take an additional dose at mid afternoon if needed for pain.    Marland Kitchen amLODipine (NORVASC) 5 MG tablet Take 2.5 mg by mouth daily.    . Aromatic Inhalants (VICKS BABYRUB) OINT Apply 1 application topically.     Marland Kitchen buPROPion (WELLBUTRIN) 75 MG tablet Take 75 mg by mouth daily.    . cetirizine (ZYRTEC) 10 MG tablet Take 10 mg by mouth daily as needed for allergies.    . Cyanocobalamin (VITAMIN B 12) 100 MCG LOZG Take by mouth.    Marland Kitchen  docusate sodium (COLACE) 100 MG capsule Take 100-200 mg by mouth daily as needed for mild constipation.     . DULoxetine (CYMBALTA) 60 MG capsule Take 60 mg by mouth daily.     . fluticasone (FLONASE) 50 MCG/ACT nasal spray Place 1 spray into the nose daily as needed for allergies.     . Menthol-Methyl Salicylate (MUSCLE RUB) 10-15 % CREA Apply 1 application topically 3 (three) times daily as needed (knee pain/arthritis.).     Marland Kitchen montelukast (SINGULAIR) 10 MG tablet Take 10 mg by mouth at bedtime.    Marland Kitchen omeprazole (PRILOSEC) 40 MG capsule Take 40 mg by mouth every evening.     . polyethylene glycol (MIRALAX / GLYCOLAX) 17 g packet Take 17 g by mouth daily.    Marland Kitchen VITAMIN D PO Take 1,000 Int'l Units/day by mouth daily.    . clindamycin (CLEOCIN) 150 MG capsule Prior to dental work (Patient not taking: Reported on 10/17/2021)    . meclizine  (ANTIVERT) 12.5 MG tablet Take 12.5 mg by mouth 3 (three) times daily as needed for dizziness (vertigo).     . traMADol (ULTRAM) 50 MG tablet Take 50 mg by mouth every 6 (six) hours as needed.     No current facility-administered medications for this visit.   Facility-Administered Medications Ordered in Other Visits  Medication Dose Route Frequency Provider Last Rate Last Admin  . 0.9 %  sodium chloride infusion   Intravenous Once Charlaine Dalton R, MD      . iron sucrose (VENOFER) 200 mg in sodium chloride 0.9 % 100 mL IVPB  200 mg Intravenous Once Charlaine Dalton R, MD          PHYSICAL EXAMINATION:   Vitals:   01/17/22 1300  BP: (!) 148/79  Pulse: 64  Resp: 16  Temp: 98.1 F (36.7 C)  SpO2: 98%   Filed Weights   01/17/22 1300  Weight: 141 lb 4.8 oz (64.1 kg)    Physical Exam Constitutional:      Appearance: Normal appearance.  HENT:     Head: Normocephalic and atraumatic.  Eyes:     Pupils: Pupils are equal, round, and reactive to light.  Cardiovascular:     Rate and Rhythm: Normal rate and regular rhythm.     Heart sounds: Normal heart sounds. No murmur heard. Pulmonary:     Effort: Pulmonary effort is normal.     Breath sounds: Normal breath sounds. No wheezing.  Abdominal:     General: Bowel sounds are normal. There is no distension.     Palpations: Abdomen is soft.     Tenderness: There is no abdominal tenderness.  Musculoskeletal:        General: Normal range of motion.     Cervical back: Normal range of motion.  Skin:    General: Skin is warm and dry.     Findings: No rash.  Neurological:     Mental Status: She is alert and oriented to person, place, and time.     Gait: Gait is intact.  Psychiatric:        Mood and Affect: Mood and affect normal.        Cognition and Memory: Memory normal.        Judgment: Judgment normal.    LABORATORY DATA:  I have reviewed the data as listed Lab Results  Component Value Date   WBC 5.4 01/17/2022    HGB 10.8 (L) 01/17/2022   HCT 33.9 (L) 01/17/2022   MCV  89.2 01/17/2022   PLT 253 01/17/2022   Recent Labs    07/17/21 1303  NA 136  K 4.4  CL 109  CO2 24  GLUCOSE 105*  BUN 23  CREATININE 1.21*  CALCIUM 9.0  GFRNONAA 46*      No results found.  Iron deficiency #Iron deficient anemia-hemoglobin ~9/ferritin-7 [FEB 2022; PCP]; patient symptomatic.  Poor tolerance to p.o. iron.  # Today- Hb-10.8  proceed with IV iron infusion today.   # Etiology of iron deficiency anemia -unclear.EGD/colonosopy-  May, 2022 [Dr.Locklear]- No evidence of any GI blood loss. Discussed re:  Capsule study.  Discussed with Dr.Locklear/   # right flank pain- for last 3-4 months-progressively getting worse.  Previous UA negative for blood.  Question stone versus others including malignancy.  Check stat CT stone protocol.  Discussed with the patient.  # B12 low- pills once a day [PCP].    # DISPOSITION: # STAT CT scan  # venofer today # venofer weekly x2 more # Follow up in 3 months-MD; labs- cbc/bmp-possible venofer- Dr.B  Plan/Assessment:  Iron deficient anemia- Unclear etiology-malabsorption Unable to tolerate p.o. iron. She has received intermittent IV iron infusions last given on 04/19/2020. Had EGD/colonoscopy which was essentially unremarkable on 05/27/2020. Labs from today show hemoglobin of 11.9. Proceed with 1 dose of IV Venofer. RTC in 3 months for repeat labs and assessment with possible iron  B12 deficiency- We will recheck her B12 level at her next visit. Continue oral B12 supplements.    Disposition- Proceed with IV Venofer today. RTC in 3 months for follow-up with labs (CBC, ferritin, iron panel and B12), MD assessment and possible IV Venofer.   All questions were answered. The patient knows to call the clinic with any problems, questions or concerns.  I spent 20 minutes dedicated to the care of this patient (face-to-face and non-face-to-face) on the date of the encounter  to include what is described in the assessment and plan.    Cammie Sickle, MD 01/17/2022 2:08 PM

## 2022-01-18 ENCOUNTER — Ambulatory Visit
Admission: RE | Admit: 2022-01-18 | Discharge: 2022-01-18 | Disposition: A | Discharge status: Home / Self Care | Payer: Medicare PPO | Source: Ambulatory Visit | Attending: Internal Medicine | Admitting: Internal Medicine

## 2022-01-18 DIAGNOSIS — R109 Unspecified abdominal pain: Secondary | ICD-10-CM | POA: Diagnosis present

## 2022-01-18 DIAGNOSIS — R1011 Right upper quadrant pain: Secondary | ICD-10-CM | POA: Insufficient documentation

## 2022-01-24 ENCOUNTER — Telehealth: Payer: Self-pay

## 2022-01-24 NOTE — Telephone Encounter (Signed)
Patient notified

## 2022-01-24 NOTE — Telephone Encounter (Signed)
-----  Message from Cammie Sickle, MD sent at 01/24/2022  2:46 PM EST ----- H/M- Please inform patient that no acute cause of her abdominal pain was noted on the CT scan.  I suspect it was a muscle pull.  Recommend Tylenol/Motrin every 6-8 hours as needed.  If not improved reach to PCP.  Thanks GB

## 2022-01-26 ENCOUNTER — Inpatient Hospital Stay: Payer: Medicare PPO

## 2022-01-26 VITALS — BP 132/61 | HR 68 | Temp 97.2°F | Resp 18

## 2022-01-26 DIAGNOSIS — E611 Iron deficiency: Secondary | ICD-10-CM

## 2022-01-26 DIAGNOSIS — D509 Iron deficiency anemia, unspecified: Secondary | ICD-10-CM | POA: Diagnosis not present

## 2022-01-26 MED ORDER — SODIUM CHLORIDE 0.9 % IV SOLN
200.0000 mg | Freq: Once | INTRAVENOUS | Status: AC
  Administered 2022-01-26: 200 mg via INTRAVENOUS
  Filled 2022-01-26: qty 10

## 2022-01-26 MED ORDER — SODIUM CHLORIDE 0.9 % IV SOLN
Freq: Once | INTRAVENOUS | Status: AC
  Filled 2022-01-26: qty 250

## 2022-01-26 NOTE — Patient Instructions (Signed)
Iron Sucrose Injection What is this medication? IRON SUCROSE (EYE ern SOO krose) treats low levels of iron (iron deficiency anemia) in people with kidney disease. Iron is a mineral that plays an important role in making red blood cells, which carry oxygen from your lungs to the rest of your body. This medicine may be used for other purposes; ask your health care provider or pharmacist if you have questions. COMMON BRAND NAME(S): Venofer What should I tell my care team before I take this medication? They need to know if you have any of these conditions: Anemia not caused by low iron levels Heart disease High levels of iron in the blood Kidney disease Liver disease An unusual or allergic reaction to iron, other medications, foods, dyes, or preservatives Pregnant or trying to get pregnant Breastfeeding How should I use this medication? This medication is for infusion into a vein. It is given in a hospital or clinic setting. Talk to your care team about the use of this medication in children. While this medication may be prescribed for children as young as 2 years for selected conditions, precautions do apply. Overdosage: If you think you have taken too much of this medicine contact a poison control center or emergency room at once. NOTE: This medicine is only for you. Do not share this medicine with others. What if I miss a dose? Keep appointments for follow-up doses. It is important not to miss your dose. Call your care team if you are unable to keep an appointment. What may interact with this medication? Do not take this medication with any of the following: Deferoxamine Dimercaprol Other iron products This medication may also interact with the following: Chloramphenicol Deferasirox This list may not describe all possible interactions. Give your health care provider a list of all the medicines, herbs, non-prescription drugs, or dietary supplements you use. Also tell them if you smoke,  drink alcohol, or use illegal drugs. Some items may interact with your medicine. What should I watch for while using this medication? Visit your care team regularly. Tell your care team if your symptoms do not start to get better or if they get worse. You may need blood work done while you are taking this medication. You may need to follow a special diet. Talk to your care team. Foods that contain iron include: whole grains/cereals, dried fruits, beans, or peas, leafy green vegetables, and organ meats (liver, kidney). What side effects may I notice from receiving this medication? Side effects that you should report to your care team as soon as possible: Allergic reactions--skin rash, itching, hives, swelling of the face, lips, tongue, or throat Low blood pressure--dizziness, feeling faint or lightheaded, blurry vision Shortness of breath Side effects that usually do not require medical attention (report to your care team if they continue or are bothersome): Flushing Headache Joint pain Muscle pain Nausea Pain, redness, or irritation at injection site This list may not describe all possible side effects. Call your doctor for medical advice about side effects. You may report side effects to FDA at 1-800-FDA-1088. Where should I keep my medication? This medication is given in a hospital or clinic and will not be stored at home. NOTE: This sheet is a summary. It may not cover all possible information. If you have questions about this medicine, talk to your doctor, pharmacist, or health care provider.  2023 Elsevier/Gold Standard (2020-03-31 00:00:00)

## 2022-02-02 ENCOUNTER — Inpatient Hospital Stay: Payer: Medicare PPO | Attending: Internal Medicine

## 2022-02-02 VITALS — BP 128/66 | HR 78 | Temp 98.0°F | Resp 17

## 2022-02-02 DIAGNOSIS — E611 Iron deficiency: Secondary | ICD-10-CM

## 2022-02-02 DIAGNOSIS — D509 Iron deficiency anemia, unspecified: Secondary | ICD-10-CM | POA: Insufficient documentation

## 2022-02-02 MED ORDER — SODIUM CHLORIDE 0.9% FLUSH
10.0000 mL | Freq: Once | INTRAVENOUS | Status: AC | PRN
  Administered 2022-02-02: 10 mL
  Filled 2022-02-02: qty 10

## 2022-02-02 MED ORDER — SODIUM CHLORIDE 0.9 % IV SOLN
200.0000 mg | Freq: Once | INTRAVENOUS | Status: AC
  Administered 2022-02-02: 200 mg via INTRAVENOUS
  Filled 2022-02-02: qty 200

## 2022-02-02 MED ORDER — SODIUM CHLORIDE 0.9 % IV SOLN
Freq: Once | INTRAVENOUS | Status: AC
  Filled 2022-02-02: qty 250

## 2022-02-02 NOTE — Progress Notes (Signed)
Patient tolerated Venofer infusion well, no questions/concerns voiced. Monitored 30 min post transfusion. Patient stable at discharge. VSS. AVS given.    

## 2022-02-02 NOTE — Patient Instructions (Signed)

## 2022-04-18 ENCOUNTER — Inpatient Hospital Stay: Payer: Medicare PPO

## 2022-04-18 ENCOUNTER — Inpatient Hospital Stay: Payer: Medicare PPO | Attending: Internal Medicine | Admitting: Internal Medicine

## 2022-04-18 ENCOUNTER — Encounter: Payer: Self-pay | Admitting: Internal Medicine

## 2022-04-18 VITALS — BP 136/66 | HR 72 | Temp 98.8°F | Resp 16 | Wt 139.0 lb

## 2022-04-18 DIAGNOSIS — D509 Iron deficiency anemia, unspecified: Secondary | ICD-10-CM | POA: Diagnosis not present

## 2022-04-18 DIAGNOSIS — E611 Iron deficiency: Secondary | ICD-10-CM

## 2022-04-18 LAB — BASIC METABOLIC PANEL
Anion gap: 7 (ref 5–15)
BUN: 17 mg/dL (ref 8–23)
CO2: 24 mmol/L (ref 22–32)
Calcium: 9.3 mg/dL (ref 8.9–10.3)
Chloride: 107 mmol/L (ref 98–111)
Creatinine, Ser: 1.1 mg/dL — ABNORMAL HIGH (ref 0.44–1.00)
GFR, Estimated: 51 mL/min — ABNORMAL LOW (ref >60)
Glucose, Bld: 106 mg/dL — ABNORMAL HIGH (ref 70–99)
Potassium: 4.4 mmol/L (ref 3.5–5.1)
Sodium: 138 mmol/L (ref 135–145)

## 2022-04-18 LAB — CBC WITH DIFFERENTIAL/PLATELET
Abs Immature Granulocytes: 0.02 10*3/uL (ref 0.00–0.07)
Basophils Absolute: 0 10*3/uL (ref 0.0–0.1)
Basophils Relative: 1 %
Eosinophils Absolute: 0.1 10*3/uL (ref 0.0–0.5)
Eosinophils Relative: 1 %
HCT: 35 % — ABNORMAL LOW (ref 36.0–46.0)
Hemoglobin: 11.1 g/dL — ABNORMAL LOW (ref 12.0–15.0)
Immature Granulocytes: 0 %
Lymphocytes Relative: 30 %
Lymphs Abs: 2.1 10*3/uL (ref 0.7–4.0)
MCH: 29.2 pg (ref 26.0–34.0)
MCHC: 31.7 g/dL (ref 30.0–36.0)
MCV: 92.1 fL (ref 80.0–100.0)
Monocytes Absolute: 0.6 10*3/uL (ref 0.1–1.0)
Monocytes Relative: 8 %
Neutro Abs: 4.3 10*3/uL (ref 1.7–7.7)
Neutrophils Relative %: 60 %
Platelets: 250 10*3/uL (ref 150–400)
RBC: 3.8 MIL/uL — ABNORMAL LOW (ref 3.87–5.11)
RDW: 13.8 % (ref 11.5–15.5)
WBC: 7.1 10*3/uL (ref 4.0–10.5)
nRBC: 0 % (ref 0.0–0.2)

## 2022-04-18 MED ORDER — SODIUM CHLORIDE 0.9 % IV SOLN
200.0000 mg | Freq: Once | INTRAVENOUS | Status: AC
  Administered 2022-04-18: 200 mg via INTRAVENOUS
  Filled 2022-04-18: qty 200

## 2022-04-18 MED ORDER — SODIUM CHLORIDE 0.9 % IV SOLN
Freq: Once | INTRAVENOUS | Status: AC
  Filled 2022-04-18: qty 250

## 2022-04-18 NOTE — Progress Notes (Signed)
Patient here for oncology follow-up appointment,  concerns of Fatigue and dizziness

## 2022-04-18 NOTE — Progress Notes (Signed)
[facility] CONSULT NOTE  Patient Care Team: Gayla Doss, MD as PCP - General (Cardiology) Earna Coder, MD as Consulting Physician (Internal Medicine) Stanton Kidney, MD as Consulting Physician (Gastroenterology)  CHIEF COMPLAINTS/PURPOSE OF CONSULTATION: ANEMIA   HEMATOLOGY HISTORY:  # MICROCYTIC ANEMIA [FEB 2022- Hb 9.6; Ferritin-6] > 25 years Hx of hematuria [[place]; urology]-cystoscopy;  EGD-/ colonoscopy-2022 polyps ; capsule-Bone marrow Biopsy-NONE  HISTORY OF PRESENTING ILLNESS: Ambulating independently.  Accompanied by husband-Parkinson  Christine Ballard 82 y.o.  female iron deficient anemia of unclear etiology is here for follow-up.  Patient expresses concerns of Fatigue and dizzines.  Patient states that she gets short of breath on exertion.  Last visit patient had a CAT scan for right flank pain negative for any acute process. However, continues to have intermittent pain.   Poor tolerance to oral iron.  No nausea no vomiting.  Mild weight loss otherwise appetite is fair.  No nausea no vomiting.  Review of Systems  Constitutional:  Positive for malaise/fatigue. Negative for chills, fever and weight loss.  HENT:  Negative for congestion, ear pain and tinnitus.   Eyes: Negative.  Negative for blurred vision and double vision.  Respiratory:  Positive for shortness of breath. Negative for cough and sputum production.   Cardiovascular: Negative.  Negative for chest pain, palpitations and leg swelling.  Gastrointestinal: Negative.  Negative for abdominal pain, constipation, diarrhea, nausea and vomiting.  Genitourinary:  Positive for flank pain. Negative for dysuria, frequency and urgency.  Musculoskeletal:  Negative for back pain and falls.  Skin: Negative.  Negative for rash.  Neurological: Negative.  Negative for weakness and headaches.  Endo/Heme/Allergies: Negative.  Does not bruise/bleed easily.  Psychiatric/Behavioral: Negative.  Negative  for depression. The patient is not nervous/anxious and does not have insomnia.     MEDICAL HISTORY:  Past Medical History:  Diagnosis Date   Allergic rhinitis    Allergy-induced asthma    seasonal allergies   Anxiety    Arthritis    chronic hip pain,right   Cancer    skin on back   Cataracts, bilateral    "small"   Chronic kidney disease    mild renal insufficiency   Constipation    COPD (chronic obstructive pulmonary disease)    Depression    Dyspnea    GERD (gastroesophageal reflux disease)    Headache    occular   History of bronchitis    "about twice a year"   History of hematuria    Hypertension    Hypoglycemia    distant past   Osteopenia    Osteopenia    Torn meniscus    bilateral   Vertigo    2-3x/yr   Wears glasses     SURGICAL HISTORY: Past Surgical History:  Procedure Laterality Date   ABDOMINAL HYSTERECTOMY     ANTERIOR CERVICAL DECOMP/DISCECTOMY FUSION N/A 06/26/2016   Procedure: Cervical Two-Three, Cervical Three-Four, Cervical Four-Five, Cervical Five-Six Anterior Discectomy with Fusion and Plate Fixation;  Surgeon: Ditty, Loura Halt, MD;  Location: [facility];  Service: Neurosurgery;  Laterality: N/A;  C3-6 Anterior discectomy with fusion and plate fixation   BASAL CELL CARCINOMA EXCISION     on back   BLEPHAROPLASTY     CARPAL TUNNEL RELEASE Bilateral    CATARACT EXTRACTION W/PHACO Right 02/03/2019   Procedure: CATARACT EXTRACTION PHACO AND INTRAOCULAR LENS PLACEMENT (I[facility]) RIGHT 5.39 00:38.1;  Surgeon: Galen Manila, MD;  Location: [facility];  Service: Ophthalmology;  Laterality: Right;  CATARACT EXTRACTION W/PHACO Left 02/24/2019   Procedure: CATARACT EXTRACTION PHACO AND INTRAOCULAR LENS PLACEMENT (IOC)  LEFT;  Surgeon: Galen Manila, MD;  Location: [facility];  Service: Ophthalmology;  Laterality: Left;  CDE 4.86 U/S 0:26.1   COLONOSCOPY     COLONOSCOPY WITH PROPOFOL N/A 10/16/2016   Procedure: COLONOSCOPY WITH  PROPOFOL;  Surgeon: Christena Deem, MD;  Location: [facility] END[facility]COPY;  Service: Endoscopy;  Laterality: N/A;   COLONOSCOPY WITH PROPOFOL N/A 05/27/2020   Procedure: COLONOSCOPY WITH PROPOFOL;  Surgeon: Regis Bill, MD;  Location: [facility] ENDOSCOPY;  Service: Endoscopy;  Laterality: N/A;   ESOPHAGOGASTRODUODENOSCOPY (EGD) WITH PROPOFOL N/A 05/27/2020   Procedure: ESOPHAGOGASTRODUODENOSCOPY (EGD) WITH PROPOFOL;  Surgeon: Regis Bill, MD;  Location: [facility];  Service: Endoscopy;  Laterality: N/A;   EYE SURGERY     NASAL SINUS SURGERY     ORIF WRIST FRACTURE Left 12/02/2019   Procedure: OPEN REDUCTION INTERNAL FIXATION (ORIF) WRIST FRACTURE;  Surgeon: Christena Flake, MD;  Location: [facility];  Service: Orthopedics;  Laterality: Left;   PARTIAL KNEE ARTHROPLASTY Right 05/26/2019   Procedure: UNICOMPARTMENTAL KNEE;  Surgeon: Christena Flake, MD;  Location: [facility];  Service: Orthopedics;  Laterality: Right;   REPAIR EXTENSOR TENDON Left 07/10/2018   Procedure: REPAIR EXTENSOR TENDON LEFT LONG FINGER REALIGNMENT;  Surgeon: Kennedy Bucker, MD;  Location: [facility];  Service: Orthopedics;  Laterality: Left;   TONSILLECTOMY      SOCIAL HISTORY: Social History   Socioeconomic History   Marital status: Married    Spouse name: kenneth   Number of children: Not on file   Years of education: Not on file   Highest education level: Not on file  Occupational History   Not on file  Tobacco Use   Smoking status: Never   Smokeless tobacco: Never  Vaping Use   Vaping Use: Never used  Substance and Sexual Activity   Alcohol use: No   Drug use: No   Sexual activity: Not on file  Other Topics Concern   Not on file  Social History Narrative   Lives in [place] with husband. Daughter in [place]. No alcohol. Never smoked. Worked at [employer].    Social Determinants of Health   Financial Resource Strain: Not on file  Food Insecurity: Not on file   Transportation Needs: Not on file  Physical Activity: Not on file  Stress: Not on file  Social Connections: Not on file  Intimate Partner Violence: Not on file    FAMILY HISTORY: Family History  Problem Relation Age of Onset   Breast cancer Maternal Aunt    Breast cancer Cousin     ALLERGIES:  is allergic to bisacodyl, ciprofloxacin, procaine, penicillins, and sulfa antibiotics.  MEDICATIONS:  Current Outpatient Medications  Medication Sig Dispense Refill   acetaminophen (TYLENOL) 650 MG CR tablet Take 650 mg by mouth See admin instructions. Take 1 tablet (650 mg) by mouth scheduled twice daily, may take an additional dose at mid afternoon if needed for pain.     amLODipine (NORVASC) 2.5 MG tablet Take 2.5 mg by mouth daily.     Aromatic Inhalants (VICKS BABYRUB) OINT Apply 1 application topically.      buPROPion (WELLBUTRIN) 75 MG tablet Take 75 mg by mouth daily.     cetirizine (ZYRTEC) 10 MG tablet Take 10 mg by mouth daily as needed for allergies.     Cyanocobalamin (VITAMIN B 12) 100 MCG LOZG Take by mouth.  docusate sodium (COLACE) 100 MG capsule Take 100-200 mg by mouth daily as needed for mild constipation.      DULoxetine (CYMBALTA) 60 MG capsule Take 60 mg by mouth daily.      fluticasone (FLONASE) 50 MCG/ACT nasal spray Place 1 spray into the nose daily as needed for allergies.      meclizine (ANTIVERT) 12.5 MG tablet Take 12.5 mg by mouth 3 (three) times daily as needed for dizziness (vertigo).      Menthol-Methyl Salicylate (MUSCLE RUB) 10-15 % CREA Apply 1 application topically 3 (three) times daily as needed (knee pain/arthritis.).      montelukast (SINGULAIR) 10 MG tablet Take 10 mg by mouth at bedtime.     omeprazole (PRILOSEC) 40 MG capsule Take 40 mg by mouth every evening.      polyethylene glycol (MIRALAX / GLYCOLAX) 17 g packet Take 17 g by mouth daily.     VITAMIN D PO Take 1,000 Int'l Units/day by mouth daily.     amLODipine (NORVASC) 5 MG tablet Take  2.5 mg by mouth daily.     clindamycin (CLEOCIN) 150 MG capsule Prior to dental work (Patient not taking: Reported on 10/17/2021)     No current facility-administered medications for this visit.   Facility-Administered Medications Ordered in Other Visits  Medication Dose Route Frequency Provider Last Rate Last Admin   iron sucrose (VENOFER) 200 mg in sodium chloride 0.9 % 100 mL IVPB  200 mg Intravenous Once Earna Coder, MD 440 mL/hr at 04/18/22 1422 200 mg at 04/18/22 1422      PHYSICAL EXAMINATION:   Vitals:   04/18/22 1334  BP: 136/66  Pulse: 72  Resp: 16  Temp: 98.8 F (37.1 C)  SpO2: 99%   Filed Weights   04/18/22 1334  Weight: 139 lb (63 kg)    Physical Exam Constitutional:      Appearance: Normal appearance.  HENT:     Head: Normocephalic and atraumatic.  Eyes:     Pupils: Pupils are equal, round, and reactive to light.  Cardiovascular:     Rate and Rhythm: Normal rate and regular rhythm.     Heart sounds: Normal heart sounds. No murmur heard. Pulmonary:     Effort: Pulmonary effort is normal.     Breath sounds: Normal breath sounds. No wheezing.  Abdominal:     General: Bowel sounds are normal. There is no distension.     Palpations: Abdomen is soft.     Tenderness: There is no abdominal tenderness.  Musculoskeletal:        General: Normal range of motion.     Cervical back: Normal range of motion.  Skin:    General: Skin is warm and dry.     Findings: No rash.  Neurological:     Mental Status: She is alert and oriented to person, place, and time.     Gait: Gait is intact.  Psychiatric:        Mood and Affect: Mood and affect normal.        Cognition and Memory: Memory normal.        Judgment: Judgment normal.     LABORATORY DATA:  I have reviewed the data as listed Lab Results  Component Value Date   WBC 7.1 04/18/2022   HGB 11.1 (L) 04/18/2022   HCT 35.0 (L) 04/18/2022   MCV 92.1 04/18/2022   PLT 250 04/18/2022   Recent Labs     07/17/21 1303 04/18/22 1319  NA 136  138  K 4.4 4.4  CL 109 107  CO2 24 24  GLUCOSE 105* 106*  BUN 23 17  CREATININE 1.21* 1.10*  CALCIUM 9.0 9.3  GFRNONAA 46* 51*      No results found.  Iron deficiency #Iron deficient anemia-hemoglobin ~9/ferritin-7 [FEB 2022; PCP]; patient symptomatic.  Poor tolerance to p.o. iron.  # Today- Hb-11.3   proceed with IV iron infusion today.   # Etiology of iron deficiency anemia -unclear.EGD/colonosopy-  May, 2022 [Dr.Locklear]- No evidence of any GI blood loss. Discussed re:  Capsule study.  Discussed with Dr.Locklear/ call Dr. Mart Piggs office regarding camera/capsule study to look for bleeding in the small bowel.   # B12 low- pills once a day [PCP].    # DISPOSITION: # venofer today # Follow up in 3 months-MD; labs- cbc/bmp;b12 levels iron studies;ferritin; -possible venofer- Dr.B     Earna Coder, MD 04/18/2022 2:24 PM

## 2022-04-18 NOTE — Patient Instructions (Signed)
#   Please call Dr. Mart Piggs office regarding camera/capsule study to look for bleeding in the small bowel.

## 2022-04-18 NOTE — Assessment & Plan Note (Addendum)
#  Iron deficient anemia-hemoglobin ~9/ferritin-7 [FEB 2022; PCP]; patient symptomatic.  Poor tolerance to p.o. iron.  # Today- Hb-11.3   proceed with IV iron infusion today.   # Etiology of iron deficiency anemia -unclear.EGD/colonosopy-  May, 2022 [Dr.Locklear]- No evidence of any GI blood loss. Discussed re:  Capsule study.  Discussed with Dr.Locklear/ call Dr. Mart Piggs office regarding camera/capsule study to look for bleeding in the small bowel.   # B12 low- pills once a day [PCP].    # DISPOSITION: # venofer today # Follow up in 3 months-MD; labs- cbc/bmp;b12 levels iron studies;ferritin; -possible venofer- Dr.B

## 2022-07-18 ENCOUNTER — Ambulatory Visit: Payer: Medicare PPO | Admitting: Internal Medicine

## 2022-07-18 ENCOUNTER — Ambulatory Visit: Payer: Medicare PPO

## 2022-07-18 ENCOUNTER — Other Ambulatory Visit: Payer: Medicare PPO

## 2022-07-24 ENCOUNTER — Inpatient Hospital Stay: Payer: Medicare PPO | Attending: Internal Medicine

## 2022-07-24 ENCOUNTER — Inpatient Hospital Stay: Payer: Medicare PPO

## 2022-07-24 ENCOUNTER — Encounter: Payer: Self-pay | Admitting: Internal Medicine

## 2022-07-24 ENCOUNTER — Inpatient Hospital Stay: Payer: Medicare PPO | Admitting: Internal Medicine

## 2022-07-24 VITALS — BP 140/72 | HR 65 | Temp 97.6°F | Resp 20 | Wt 140.7 lb

## 2022-07-24 DIAGNOSIS — D509 Iron deficiency anemia, unspecified: Secondary | ICD-10-CM | POA: Diagnosis present

## 2022-07-24 DIAGNOSIS — E611 Iron deficiency: Secondary | ICD-10-CM

## 2022-07-24 DIAGNOSIS — Z79899 Other long term (current) drug therapy: Secondary | ICD-10-CM | POA: Diagnosis not present

## 2022-07-24 LAB — CBC WITH DIFFERENTIAL (CANCER CENTER ONLY)
Abs Immature Granulocytes: 0.01 10*3/uL (ref 0.00–0.07)
Basophils Absolute: 0 10*3/uL (ref 0.0–0.1)
Basophils Relative: 1 %
Eosinophils Absolute: 0.1 10*3/uL (ref 0.0–0.5)
Eosinophils Relative: 1 %
HCT: 30.1 % — ABNORMAL LOW (ref 36.0–46.0)
Hemoglobin: 9.3 g/dL — ABNORMAL LOW (ref 12.0–15.0)
Immature Granulocytes: 0 %
Lymphocytes Relative: 34 %
Lymphs Abs: 1.7 10*3/uL (ref 0.7–4.0)
MCH: 27.1 pg (ref 26.0–34.0)
MCHC: 30.9 g/dL (ref 30.0–36.0)
MCV: 87.8 fL (ref 80.0–100.0)
Monocytes Absolute: 0.5 10*3/uL (ref 0.1–1.0)
Monocytes Relative: 10 %
Neutro Abs: 2.7 10*3/uL (ref 1.7–7.7)
Neutrophils Relative %: 54 %
Platelet Count: 280 10*3/uL (ref 150–400)
RBC: 3.43 MIL/uL — ABNORMAL LOW (ref 3.87–5.11)
RDW: 13.1 % (ref 11.5–15.5)
WBC Count: 5.1 10*3/uL (ref 4.0–10.5)
nRBC: 0 % (ref 0.0–0.2)

## 2022-07-24 LAB — IRON AND TIBC
Iron: 46 ug/dL (ref 28–170)
Saturation Ratios: 9 % — ABNORMAL LOW (ref 10.4–31.8)
TIBC: 494 ug/dL — ABNORMAL HIGH (ref 250–450)
UIBC: 448 ug/dL

## 2022-07-24 LAB — BASIC METABOLIC PANEL - CANCER CENTER ONLY
Anion gap: 7 (ref 5–15)
BUN: 18 mg/dL (ref 8–23)
CO2: 24 mmol/L (ref 22–32)
Calcium: 8.9 mg/dL (ref 8.9–10.3)
Chloride: 106 mmol/L (ref 98–111)
Creatinine: 1.19 mg/dL — ABNORMAL HIGH (ref 0.44–1.00)
GFR, Estimated: 46 mL/min — ABNORMAL LOW (ref >60)
Glucose, Bld: 112 mg/dL — ABNORMAL HIGH (ref 70–99)
Potassium: 4.6 mmol/L (ref 3.5–5.1)
Sodium: 137 mmol/L (ref 135–145)

## 2022-07-24 LAB — FERRITIN: Ferritin: 5 ng/mL — ABNORMAL LOW (ref 11–307)

## 2022-07-24 LAB — VITAMIN B12: Vitamin B-12: 560 pg/mL (ref 180–914)

## 2022-07-24 MED ORDER — SODIUM CHLORIDE 0.9 % IV SOLN
Freq: Once | INTRAVENOUS | Status: AC
  Filled 2022-07-24: qty 250

## 2022-07-24 MED ORDER — SODIUM CHLORIDE 0.9 % IV SOLN
200.0000 mg | Freq: Once | INTRAVENOUS | Status: AC
  Administered 2022-07-24: 200 mg via INTRAVENOUS
  Filled 2022-07-24: qty 200

## 2022-07-24 NOTE — Assessment & Plan Note (Addendum)
#  Iron deficient anemia-hemoglobin ~9/ferritin-7 [FEB 2022; PCP]; patient symptomatic.  Poor tolerance to p.o. iron.  # Today- Hb- 9.3 - proceed with IV iron infusion today.   # Etiology of iron deficiency anemia -unclear. EGD/colonosopy-  May, 2022 [Dr.Locklear]- No evidence of any GI blood loss. CT JAN 2024- NED. Discussed re:  Capsule study.  Previously, discussed with GI-capsule study to look for bleeding in the small bowel. June 2024- Hemoccult- positive; awaiting re-eval pending.  # B12 low- pills once a day [PCP].    # DISPOSITION: # venofer today # venofer weekly x 2 more # Follow up in 4 months-MD; labs- cbc/bmp;b12 levels iron studies;ferritin; -possible venofer- Dr.B

## 2022-07-24 NOTE — Progress Notes (Signed)
Patient has no concerns today. 

## 2022-07-24 NOTE — Progress Notes (Signed)
[facility] CONSULT NOTE  Patient Care Team: Gayla Doss, MD as PCP - General (Cardiology) Earna Coder, MD as Consulting Physician (Internal Medicine) Stanton Kidney, MD as Consulting Physician (Gastroenterology)  CHIEF COMPLAINTS/PURPOSE OF CONSULTATION: ANEMIA   HEMATOLOGY HISTORY:  # MICROCYTIC ANEMIA [FEB 2022- Hb 9.6; Ferritin-6] > 25 years Hx of hematuria [[place]; urology]-cystoscopy;  EGD-/ colonoscopy-2022 polyps ; capsule-Bone marrow Biopsy-NONE  HISTORY OF PRESENTING ILLNESS: Ambulating independently.  Alone.   Christine Ballard 82 y.o.  female iron deficient anemia of unclear etiology is here for follow-up.  States that she had "positive stool test". Patient continues of Fatigue and dizzines.  Patient states that she gets short of breath on exertion.   No nausea no vomiting.  Mild weight loss otherwise appetite is fair.  No nausea no vomiting.  Review of Systems  Constitutional:  Positive for malaise/fatigue. Negative for chills, fever and weight loss.  HENT:  Negative for congestion, ear pain and tinnitus.   Eyes: Negative.  Negative for blurred vision and double vision.  Respiratory:  Positive for shortness of breath. Negative for cough and sputum production.   Cardiovascular: Negative.  Negative for chest pain, palpitations and leg swelling.  Gastrointestinal: Negative.  Negative for abdominal pain, constipation, diarrhea, nausea and vomiting.  Genitourinary:  Positive for flank pain. Negative for dysuria, frequency and urgency.  Musculoskeletal:  Negative for back pain and falls.  Skin: Negative.  Negative for rash.  Neurological: Negative.  Negative for weakness and headaches.  Endo/Heme/Allergies: Negative.  Does not bruise/bleed easily.  Psychiatric/Behavioral: Negative.  Negative for depression. The patient is not nervous/anxious and does not have insomnia.     MEDICAL HISTORY:  Past Medical History:  Diagnosis Date   Allergic  rhinitis    Allergy-induced asthma    seasonal allergies   Anxiety    Arthritis    chronic hip pain,right   Cancer (HCC)    skin on back   Cataracts, bilateral    "small"   Chronic kidney disease    mild renal insufficiency   Constipation    COPD (chronic obstructive pulmonary disease) (HCC)    Depression    Dyspnea    GERD (gastroesophageal reflux disease)    Headache    occular   History of bronchitis    "about twice a year"   History of hematuria    Hypertension    Hypoglycemia    distant past   Osteopenia    Osteopenia    Torn meniscus    bilateral   Vertigo    2-3x/yr   Wears glasses     SURGICAL HISTORY: Past Surgical History:  Procedure Laterality Date   ABDOMINAL HYSTERECTOMY     ANTERIOR CERVICAL DECOMP/DISCECTOMY FUSION N/A 06/26/2016   Procedure: Cervical Two-Three, Cervical Three-Four, Cervical Four-Five, Cervical Five-Six Anterior Discectomy with Fusion and Plate Fixation;  Surgeon: Ditty, Loura Halt, MD;  Location: [facility] OR;  Service: Neurosurgery;  Laterality: N/A;  C3-6 Anterior discectomy with fusion and plate fixation   BASAL CELL CARCINOMA EXCISION     on back   BLEPHAROPLASTY     CARPAL TUNNEL RELEASE Bilateral    CATARACT EXTRACTION W/PHACO Right 02/03/2019   Procedure: CATARACT EXTRACTION PHACO AND INTRAOCULAR LENS PLACEMENT (IOC) RIGHT 5.39 00:38.1;  Surgeon: Galen Manila, MD;  Location: [facility];  Service: Ophthalmology;  Laterality: Right;   CATARACT EXTRACTION W/PHACO Left 02/24/2019   Procedure: CATARACT EXTRACTION PHACO AND INTRAOCULAR LENS PLACEMENT (IOC)  LEFT;  Surgeon:  Galen Manila, MD;  Location: [facility];  Service: Ophthalmology;  Laterality: Left;  CDE 4.86 U/S 0:26.1   COLONOSCOPY     COLONOSCOPY WITH PROPOFOL N/A 10/16/2016   Procedure: COLONOSCOPY WITH PROPOFOL;  Surgeon: Christena Deem, MD;  Location: [facility];  Service: Endoscopy;  Laterality: N/A;   COLONOSCOPY WITH PROPOFOL N/A  05/27/2020   Procedure: COLONOSCOPY WITH PROPOFOL;  Surgeon: Regis Bill, MD;  Location: [facility];  Service: Endoscopy;  Laterality: N/A;   ESOPHAGOGASTRODUODENOSCOPY (EGD) WITH PROPOFOL N/A 05/27/2020   Procedure: ESOPHAGOGASTRODUODENOSCOPY (EGD) WITH PROPOFOL;  Surgeon: Regis Bill, MD;  Location: [facility];  Service: Endoscopy;  Laterality: N/A;   EYE SURGERY     NASAL SINUS SURGERY     ORIF WRIST FRACTURE Left 12/02/2019   Procedure: OPEN REDUCTION INTERNAL FIXATION (ORIF) WRIST FRACTURE;  Surgeon: Christena Flake, MD;  Location: [facility];  Service: Orthopedics;  Laterality: Left;   PARTIAL KNEE ARTHROPLASTY Right 05/26/2019   Procedure: UNICOMPARTMENTAL KNEE;  Surgeon: Christena Flake, MD;  Location: [facility];  Service: Orthopedics;  Laterality: Right;   REPAIR EXTENSOR TENDON Left 07/10/2018   Procedure: REPAIR EXTENSOR TENDON LEFT LONG FINGER REALIGNMENT;  Surgeon: Kennedy Bucker, MD;  Location: [facility];  Service: Orthopedics;  Laterality: Left;   TONSILLECTOMY      SOCIAL HISTORY: Social History   Socioeconomic History   Marital status: Married    Spouse name: kenneth   Number of children: Not on file   Years of education: Not on file   Highest education level: Not on file  Occupational History   Not on file  Tobacco Use   Smoking status: Never   Smokeless tobacco: Never  Vaping Use   Vaping status: Never Used  Substance and Sexual Activity   Alcohol use: No   Drug use: No   Sexual activity: Not on file  Other Topics Concern   Not on file  Social History Narrative   Lives in [place] with husband. Daughter in [place]. No alcohol. Never smoked. Worked at [employer].    Social Determinants of Health   Financial Resource Strain: Low Risk  (05/01/2022)   Received from [facility], [facility]   Overall Financial Resource Strain (CARDIA)    Difficulty of Paying Living Expenses:  Not hard at all  Food Insecurity: No Food Insecurity (05/01/2022)   Received from [facility], [facility]   Hunger Vital Sign    Worried About Running Out of Food in the Last Year: Never true    Ran Out of Food in the Last Year: Never true  Transportation Needs: No Transportation Needs (05/01/2022)   Received from [facility], [facility]   [facility] - Transportation    In the past 12 months, has lack of transportation kept you from medical appointments or from getting medications?: No    Lack of Transportation (Non-Medical): No  Physical Activity: Not on file  Stress: Not on file  Social Connections: Not on file  Intimate Partner Violence: Not on file    FAMILY HISTORY: Family History  Problem Relation Age of Onset   Breast cancer Maternal Aunt    Breast cancer Cousin     ALLERGIES:  is allergic to bisacodyl, ciprofloxacin, procaine, penicillins, and sulfa antibiotics.  MEDICATIONS:  Current Outpatient Medications  Medication Sig Dispense Refill   acetaminophen (TYLENOL) 650 MG CR tablet Take 650 mg by  mouth See admin instructions. Take 1 tablet (650 mg) by mouth scheduled twice daily, may take an additional dose at mid afternoon if needed for pain.     amLODipine (NORVASC) 2.5 MG tablet Take 2.5 mg by mouth daily.     Aromatic Inhalants (VICKS BABYRUB) OINT Apply 1 application topically.      buPROPion (WELLBUTRIN) 75 MG tablet Take 75 mg by mouth daily.     cetirizine (ZYRTEC) 10 MG tablet Take 10 mg by mouth daily as needed for allergies.     Cyanocobalamin (VITAMIN B 12) 100 MCG LOZG Take by mouth.     docusate sodium (COLACE) 100 MG capsule Take 100-200 mg by mouth daily as needed for mild constipation.      DULoxetine (CYMBALTA) 60 MG capsule Take 60 mg by mouth daily.      fluticasone (FLONASE) 50 MCG/ACT nasal spray Place 1 spray into the nose daily as needed for allergies.      meclizine (ANTIVERT)  12.5 MG tablet Take 12.5 mg by mouth 3 (three) times daily as needed for dizziness (vertigo).      Menthol-Methyl Salicylate (MUSCLE RUB) 10-15 % CREA Apply 1 application topically 3 (three) times daily as needed (knee pain/arthritis.).      montelukast (SINGULAIR) 10 MG tablet Take 10 mg by mouth at bedtime.     omeprazole (PRILOSEC) 40 MG capsule Take 40 mg by mouth every evening.      polyethylene glycol (MIRALAX / GLYCOLAX) 17 g packet Take 17 g by mouth daily.     VITAMIN D PO Take 1,000 Int'l Units/day by mouth daily.     amLODipine (NORVASC) 5 MG tablet Take 2.5 mg by mouth daily.     clindamycin (CLEOCIN) 150 MG capsule Prior to dental work (Patient not taking: Reported on 10/17/2021)     No current facility-administered medications for this visit.   Facility-Administered Medications Ordered in Other Visits  Medication Dose Route Frequency Provider Last Rate Last Admin   iron sucrose (VENOFER) 200 mg in sodium chloride 0.9 % 100 mL IVPB  200 mg Intravenous Once Louretta Shorten R, MD          PHYSICAL EXAMINATION:   Vitals:   07/24/22 1312  BP: (!) 140/72  Pulse: 65  Resp: 20  Temp: 97.6 F (36.4 C)  SpO2: 100%    Filed Weights   07/24/22 1312  Weight: 140 lb 11.2 oz (63.8 kg)     Physical Exam Constitutional:      Appearance: Normal appearance.  HENT:     Head: Normocephalic and atraumatic.  Eyes:     Pupils: Pupils are equal, round, and reactive to light.  Cardiovascular:     Rate and Rhythm: Normal rate and regular rhythm.     Heart sounds: Normal heart sounds. No murmur heard. Pulmonary:     Effort: Pulmonary effort is normal.     Breath sounds: Normal breath sounds. No wheezing.  Abdominal:     General: Bowel sounds are normal. There is no distension.     Palpations: Abdomen is soft.     Tenderness: There is no abdominal tenderness.  Musculoskeletal:        General: Normal range of motion.     Cervical back: Normal range of motion.  Skin:     General: Skin is warm and dry.     Findings: No rash.  Neurological:     Mental Status: She is alert and oriented to person, place, and time.  Gait: Gait is intact.  Psychiatric:        Mood and Affect: Mood and affect normal.        Cognition and Memory: Memory normal.        Judgment: Judgment normal.     LABORATORY DATA:  I have reviewed the data as listed Lab Results  Component Value Date   WBC 5.1 07/24/2022   HGB 9.3 (L) 07/24/2022   HCT 30.1 (L) 07/24/2022   MCV 87.8 07/24/2022   PLT 280 07/24/2022   Recent Labs    04/18/22 1319 07/24/22 1249  NA 138 137  K 4.4 4.6  CL 107 106  CO2 24 24  GLUCOSE 106* 112*  BUN 17 18  CREATININE 1.10* 1.19*  CALCIUM 9.3 8.9  GFRNONAA 51* 46*      No results found.  Iron deficiency #Iron deficient anemia-hemoglobin ~9/ferritin-7 [FEB 2022; PCP]; patient symptomatic.  Poor tolerance to p.o. iron.  # Today- Hb- 9.3 - proceed with IV iron infusion today.   # Etiology of iron deficiency anemia -unclear. EGD/colonosopy-  May, 2022 [Dr.Locklear]- No evidence of any GI blood loss. Discussed re:  Capsule study.  Previously, discussed with GI-capsule study to look for bleeding in the small bowel. June 2024- Hemoccult- positive; awaiting re-eval pending.   # B12 low- pills once a day [PCP].    # DISPOSITION: # venofer today # venofer weekly x 2 more # Follow up in 4 months-MD; labs- cbc/bmp;b12 levels iron studies;ferritin; -possible venofer- Dr.B      Earna Coder, MD 07/24/2022 1:52 PM

## 2022-07-31 ENCOUNTER — Ambulatory Visit: Payer: Medicare PPO

## 2022-08-01 ENCOUNTER — Inpatient Hospital Stay: Payer: Medicare PPO

## 2022-08-01 VITALS — BP 131/67 | HR 66 | Temp 98.8°F | Resp 18

## 2022-08-01 DIAGNOSIS — D509 Iron deficiency anemia, unspecified: Secondary | ICD-10-CM | POA: Diagnosis not present

## 2022-08-01 DIAGNOSIS — E611 Iron deficiency: Secondary | ICD-10-CM

## 2022-08-01 MED ORDER — SODIUM CHLORIDE 0.9 % IV SOLN
200.0000 mg | Freq: Once | INTRAVENOUS | Status: AC
  Administered 2022-08-01: 200 mg via INTRAVENOUS
  Filled 2022-08-01: qty 200

## 2022-08-01 MED ORDER — SODIUM CHLORIDE 0.9 % IV SOLN
Freq: Once | INTRAVENOUS | Status: AC
  Filled 2022-08-01: qty 250

## 2022-08-01 NOTE — Progress Notes (Signed)
Pt has been educated and understands. Pt declined to stay 30 mins after iron infusion. VSS.

## 2022-08-07 ENCOUNTER — Ambulatory Visit: Payer: Medicare PPO

## 2022-08-10 ENCOUNTER — Inpatient Hospital Stay: Payer: Medicare PPO | Attending: Internal Medicine

## 2022-08-10 VITALS — BP 119/60 | HR 56 | Temp 98.3°F

## 2022-08-10 DIAGNOSIS — D509 Iron deficiency anemia, unspecified: Secondary | ICD-10-CM | POA: Insufficient documentation

## 2022-08-10 DIAGNOSIS — E611 Iron deficiency: Secondary | ICD-10-CM

## 2022-08-10 MED ORDER — SODIUM CHLORIDE 0.9 % IV SOLN
Freq: Once | INTRAVENOUS | Status: AC
  Filled 2022-08-10: qty 250

## 2022-08-10 MED ORDER — SODIUM CHLORIDE 0.9 % IV SOLN
200.0000 mg | Freq: Once | INTRAVENOUS | Status: AC
  Administered 2022-08-10: 200 mg via INTRAVENOUS
  Filled 2022-08-10: qty 200

## 2022-08-10 NOTE — Patient Instructions (Signed)
 Iron Sucrose Injection What is this medication? IRON SUCROSE (EYE ern SOO krose) treats low levels of iron (iron deficiency anemia) in people with kidney disease. Iron is a mineral that plays an important role in making red blood cells, which carry oxygen from your lungs to the rest of your body. This medicine may be used for other purposes; ask your health care provider or pharmacist if you have questions. COMMON BRAND NAME(S): Venofer What should I tell my care team before I take this medication? They need to know if you have any of these conditions: Anemia not caused by low iron levels Heart disease High levels of iron in the blood Kidney disease Liver disease An unusual or allergic reaction to iron, other medications, foods, dyes, or preservatives Pregnant or trying to get pregnant Breastfeeding How should I use this medication? This medication is for infusion into a vein. It is given in a hospital or clinic setting. Talk to your care team about the use of this medication in children. While this medication may be prescribed for children as young as 2 years for selected conditions, precautions do apply. Overdosage: If you think you have taken too much of this medicine contact a poison control center or emergency room at once. NOTE: This medicine is only for you. Do not share this medicine with others. What if I miss a dose? Keep appointments for follow-up doses. It is important not to miss your dose. Call your care team if you are unable to keep an appointment. What may interact with this medication? Do not take this medication with any of the following: Deferoxamine Dimercaprol Other iron products This medication may also interact with the following: Chloramphenicol Deferasirox This list may not describe all possible interactions. Give your health care provider a list of all the medicines, herbs, non-prescription drugs, or dietary supplements you use. Also tell them if you smoke,  drink alcohol, or use illegal drugs. Some items may interact with your medicine. What should I watch for while using this medication? Visit your care team regularly. Tell your care team if your symptoms do not start to get better or if they get worse. You may need blood work done while you are taking this medication. You may need to follow a special diet. Talk to your care team. Foods that contain iron include: whole grains/cereals, dried fruits, beans, or peas, leafy green vegetables, and organ meats (liver, kidney). What side effects may I notice from receiving this medication? Side effects that you should report to your care team as soon as possible: Allergic reactions--skin rash, itching, hives, swelling of the face, lips, tongue, or throat Low blood pressure--dizziness, feeling faint or lightheaded, blurry vision Shortness of breath Side effects that usually do not require medical attention (report to your care team if they continue or are bothersome): Flushing Headache Joint pain Muscle pain Nausea Pain, redness, or irritation at injection site This list may not describe all possible side effects. Call your doctor for medical advice about side effects. You may report side effects to FDA at 1-800-FDA-1088. Where should I keep my medication? This medication is given in a hospital or clinic. It will not be stored at home. NOTE: This sheet is a summary. It may not cover all possible information. If you have questions about this medicine, talk to your doctor, pharmacist, or health care provider.  2024 Elsevier/Gold Standard (2022-05-25 00:00:00)

## 2022-08-10 NOTE — Progress Notes (Signed)
Declined 30 minute post observation. Aware of risks. Vitals stable at discharge.  

## 2022-11-05 DIAGNOSIS — D0472 Carcinoma in situ of skin of left lower limb, including hip: Secondary | ICD-10-CM | POA: Insufficient documentation

## 2022-11-23 ENCOUNTER — Inpatient Hospital Stay: Payer: Medicare PPO | Admitting: Nurse Practitioner

## 2022-11-23 ENCOUNTER — Inpatient Hospital Stay: Payer: Medicare PPO

## 2022-12-11 ENCOUNTER — Inpatient Hospital Stay: Payer: Medicare PPO | Admitting: Nurse Practitioner

## 2022-12-11 ENCOUNTER — Encounter: Payer: Self-pay | Admitting: Nurse Practitioner

## 2022-12-11 ENCOUNTER — Inpatient Hospital Stay: Payer: Medicare PPO | Attending: Internal Medicine

## 2022-12-11 ENCOUNTER — Inpatient Hospital Stay: Payer: Medicare PPO

## 2022-12-11 VITALS — BP 151/62

## 2022-12-11 VITALS — BP 139/62 | Temp 98.2°F | Wt 140.0 lb

## 2022-12-11 DIAGNOSIS — E611 Iron deficiency: Secondary | ICD-10-CM

## 2022-12-11 DIAGNOSIS — D509 Iron deficiency anemia, unspecified: Secondary | ICD-10-CM | POA: Diagnosis not present

## 2022-12-11 DIAGNOSIS — Z79899 Other long term (current) drug therapy: Secondary | ICD-10-CM | POA: Insufficient documentation

## 2022-12-11 DIAGNOSIS — M8580 Other specified disorders of bone density and structure, unspecified site: Secondary | ICD-10-CM | POA: Insufficient documentation

## 2022-12-11 DIAGNOSIS — G43909 Migraine, unspecified, not intractable, without status migrainosus: Secondary | ICD-10-CM | POA: Insufficient documentation

## 2022-12-11 LAB — BASIC METABOLIC PANEL - CANCER CENTER ONLY
Anion gap: 7 (ref 5–15)
BUN: 21 mg/dL (ref 8–23)
CO2: 26 mmol/L (ref 22–32)
Calcium: 9.5 mg/dL (ref 8.9–10.3)
Chloride: 108 mmol/L (ref 98–111)
Creatinine: 1.08 mg/dL — ABNORMAL HIGH (ref 0.44–1.00)
GFR, Estimated: 52 mL/min — ABNORMAL LOW (ref >60)
Glucose, Bld: 113 mg/dL — ABNORMAL HIGH (ref 70–99)
Potassium: 4.4 mmol/L (ref 3.5–5.1)
Sodium: 141 mmol/L (ref 135–145)

## 2022-12-11 LAB — CBC WITH DIFFERENTIAL (CANCER CENTER ONLY)
Abs Immature Granulocytes: 0.03 10*3/uL (ref 0.00–0.07)
Basophils Absolute: 0.1 10*3/uL (ref 0.0–0.1)
Basophils Relative: 1 %
Eosinophils Absolute: 0.1 10*3/uL (ref 0.0–0.5)
Eosinophils Relative: 2 %
HCT: 35.8 % — ABNORMAL LOW (ref 36.0–46.0)
Hemoglobin: 11.2 g/dL — ABNORMAL LOW (ref 12.0–15.0)
Immature Granulocytes: 1 %
Lymphocytes Relative: 27 %
Lymphs Abs: 1.7 10*3/uL (ref 0.7–4.0)
MCH: 27.5 pg (ref 26.0–34.0)
MCHC: 31.3 g/dL (ref 30.0–36.0)
MCV: 87.7 fL (ref 80.0–100.0)
Monocytes Absolute: 0.6 10*3/uL (ref 0.1–1.0)
Monocytes Relative: 9 %
Neutro Abs: 3.9 10*3/uL (ref 1.7–7.7)
Neutrophils Relative %: 60 %
Platelet Count: 269 10*3/uL (ref 150–400)
RBC: 4.08 MIL/uL (ref 3.87–5.11)
RDW: 13.2 % (ref 11.5–15.5)
WBC Count: 6.3 10*3/uL (ref 4.0–10.5)
nRBC: 0 % (ref 0.0–0.2)

## 2022-12-11 LAB — IRON AND TIBC
Iron: 58 ug/dL (ref 28–170)
Saturation Ratios: 12 % (ref 10.4–31.8)
TIBC: 494 ug/dL — ABNORMAL HIGH (ref 250–450)
UIBC: 436 ug/dL

## 2022-12-11 LAB — FERRITIN: Ferritin: 8 ng/mL — ABNORMAL LOW (ref 11–307)

## 2022-12-11 LAB — VITAMIN B12: Vitamin B-12: 323 pg/mL (ref 180–914)

## 2022-12-11 MED ORDER — IRON SUCROSE 20 MG/ML IV SOLN
200.0000 mg | Freq: Once | INTRAVENOUS | Status: AC
  Administered 2022-12-11: 200 mg via INTRAVENOUS

## 2022-12-11 NOTE — Patient Instructions (Signed)
Iron Sucrose Injection What is this medication? IRON SUCROSE (EYE ern SOO krose) treats low levels of iron (iron deficiency anemia) in people with kidney disease. Iron is a mineral that plays an important role in making red blood cells, which carry oxygen from your lungs to the rest of your body. This medicine may be used for other purposes; ask your health care provider or pharmacist if you have questions. COMMON BRAND NAME(S): Venofer What should I tell my care team before I take this medication? They need to know if you have any of these conditions: Anemia not caused by low iron levels Heart disease High levels of iron in the blood Kidney disease Liver disease An unusual or allergic reaction to iron, other medications, foods, dyes, or preservatives Pregnant or trying to get pregnant Breastfeeding How should I use this medication? This medication is for infusion into a vein. It is given in a hospital or clinic setting. Talk to your care team about the use of this medication in children. While this medication may be prescribed for children as young as 2 years for selected conditions, precautions do apply. Overdosage: If you think you have taken too much of this medicine contact a poison control center or emergency room at once. NOTE: This medicine is only for you. Do not share this medicine with others. What if I miss a dose? Keep appointments for follow-up doses. It is important not to miss your dose. Call your care team if you are unable to keep an appointment. What may interact with this medication? Do not take this medication with any of the following: Deferoxamine Dimercaprol Other iron products This medication may also interact with the following: Chloramphenicol Deferasirox This list may not describe all possible interactions. Give your health care provider a list of all the medicines, herbs, non-prescription drugs, or dietary supplements you use. Also tell them if you smoke,  drink alcohol, or use illegal drugs. Some items may interact with your medicine. What should I watch for while using this medication? Visit your care team regularly. Tell your care team if your symptoms do not start to get better or if they get worse. You may need blood work done while you are taking this medication. You may need to follow a special diet. Talk to your care team. Foods that contain iron include: whole grains/cereals, dried fruits, beans, or peas, leafy green vegetables, and organ meats (liver, kidney). What side effects may I notice from receiving this medication? Side effects that you should report to your care team as soon as possible: Allergic reactions--skin rash, itching, hives, swelling of the face, lips, tongue, or throat Low blood pressure--dizziness, feeling faint or lightheaded, blurry vision Shortness of breath Side effects that usually do not require medical attention (report to your care team if they continue or are bothersome): Flushing Headache Joint pain Muscle pain Nausea Pain, redness, or irritation at injection site This list may not describe all possible side effects. Call your doctor for medical advice about side effects. You may report side effects to FDA at 1-800-FDA-1088. Where should I keep my medication? This medication is given in a hospital or clinic. It will not be stored at home. NOTE: This sheet is a summary. It may not cover all possible information. If you have questions about this medicine, talk to your doctor, pharmacist, or health care provider.  2024 Elsevier/Gold Standard (2022-05-25 00:00:00)

## 2022-12-11 NOTE — Progress Notes (Addendum)
[facility] CONSULT NOTE  Patient Care Team: Gayla Doss, MD as PCP - General (Cardiology) Earna Coder, MD as Consulting Physician (Internal Medicine) Stanton Kidney, MD as Consulting Physician (Gastroenterology)  CHIEF COMPLAINTS/PURPOSE OF CONSULTATION: ANEMIA   HEMATOLOGY HISTORY:  # MICROCYTIC ANEMIA [FEB 2022- Hb 9.6; Ferritin-6] > 25 years Hx of hematuria [[place]; urology]-cystoscopy;  EGD-/ colonoscopy-2022 polyps ; capsule-Bone marrow Biopsy-NONE  HISTORY OF PRESENTING ILLNESS: Ambulating independently.  Alone.   Christine Ballard 82 y.o.  female iron deficient anemia of unclear etiology is here for follow-up. Had to reschedule colonoscopy and previous appt due to family emergency. She continues to feel tired, dizziness when changing positions. Endorses shortness of breath on exertion. Denies nausea or vomiting. Weight is stable. Felt somewhat better overall since her iron infusions but gradually wore off.    Review of Systems  Constitutional:  Positive for malaise/fatigue. Negative for chills, fever and weight loss.  HENT:  Negative for congestion, ear pain and tinnitus.   Eyes:  Negative for blurred vision and double vision.  Respiratory:  Positive for shortness of breath. Negative for cough and sputum production.   Cardiovascular:  Negative for chest pain, palpitations and leg swelling.  Gastrointestinal: Negative.  Negative for abdominal pain, blood in stool, constipation, diarrhea, melena, nausea and vomiting.  Genitourinary:  Negative for dysuria, flank pain, frequency and urgency.  Musculoskeletal:  Positive for joint pain. Negative for back pain and falls.  Skin:  Negative for rash.  Neurological:  Positive for dizziness. Negative for weakness and headaches.  Endo/Heme/Allergies: Negative.  Does not bruise/bleed easily.  Psychiatric/Behavioral: Negative.  Negative for depression. The patient is not nervous/anxious and does not have  insomnia.     MEDICAL HISTORY:  Past Medical History:  Diagnosis Date   Allergic rhinitis    Allergy-induced asthma    seasonal allergies   Anxiety    Arthritis    chronic hip pain,right   Cancer (HCC)    skin on back   Cataracts, bilateral    "small"   Chronic kidney disease    mild renal insufficiency   Constipation    COPD (chronic obstructive pulmonary disease) (HCC)    Depression    Dyspnea    GERD (gastroesophageal reflux disease)    Headache    occular   History of bronchitis    "about twice a year"   History of hematuria    Hypertension    Hypoglycemia    distant past   Osteopenia    Osteopenia    Torn meniscus    bilateral   Vertigo    2-3x/yr   Wears glasses     SURGICAL HISTORY: Past Surgical History:  Procedure Laterality Date   ABDOMINAL HYSTERECTOMY     ANTERIOR CERVICAL DECOMP/DISCECTOMY FUSION N/A 06/26/2016   Procedure: Cervical Two-Three, Cervical Three-Four, Cervical Four-Five, Cervical Five-Six Anterior Discectomy with Fusion and Plate Fixation;  Surgeon: Ditty, Loura Halt, MD;  [facility];  Service: Neurosurgery;  Laterality: N/A;  C3-6 Anterior discectomy with fusion and plate fixation   BASAL CELL CARCINOMA EXCISION     on back   BLEPHAROPLASTY     CARPAL TUNNEL RELEASE Bilateral    CATARACT EXTRACTION W/PHACO Right 02/03/2019   Procedure: CATARACT EXTRACTION PHACO AND INTRAOCULAR LENS PLACEMENT (IOC) RIGHT 5.39 00:38.1;  Surgeon: Galen Manila, MD;  Location: [facility];  Service: Ophthalmology;  Laterality: Right;   CATARACT EXTRACTION W/PHACO Left 02/24/2019   Procedure: CATARACT EXTRACTION PHACO AND  INTRAOCULAR LENS PLACEMENT (IOC)  LEFT;  Surgeon: Galen Manila, MD;  Location: [facility];  Service: Ophthalmology;  Laterality: Left;  CDE 4.86 U/S 0:26.1   COLONOSCOPY     COLONOSCOPY WITH PROPOFOL N/A 10/16/2016   Procedure: COLONOSCOPY WITH PROPOFOL;  Surgeon: Christena Deem, MD;  Location: [facility]  ENDOSCOPY;  Service: Endoscopy;  Laterality: N/A;   COLONOSCOPY WITH PROPOFOL N/A 05/27/2020   Procedure: COLONOSCOPY WITH PROPOFOL;  Surgeon: Regis Bill, MD;  Location: [facility] ENDOSCOPY;  Service: Endoscopy;  Laterality: N/A;   ESOPHAGOGASTRODUODENOSCOPY (EGD) WITH PROPOFOL N/A 05/27/2020   Procedure: ESOPHAGOGASTRODUODENOSCOPY (EGD) WITH PROPOFOL;  Surgeon: Regis Bill, MD;  Location: [facility] ENDOSCOPY;  Service: Endoscopy;  Laterality: N/A;   EYE SURGERY     NASAL SINUS SURGERY     ORIF WRIST FRACTURE Left 12/02/2019   Procedure: OPEN REDUCTION INTERNAL FIXATION (ORIF) WRIST FRACTURE;  Surgeon: Christena Flake, MD;  Location: [facility] ORS;  Service: Orthopedics;  Laterality: Left;   PARTIAL KNEE ARTHROPLASTY Right 05/26/2019   Procedure: UNICOMPARTMENTAL KNEE;  Surgeon: Christena Flake, MD;  Location: [facility] ORS;  Service: Orthopedics;  Laterality: Right;   REPAIR EXTENSOR TENDON Left 07/10/2018   Procedure: REPAIR EXTENSOR TENDON LEFT LONG FINGER REALIGNMENT;  Surgeon: Kennedy Bucker, MD;  Location: [facility] ORS;  Service: Orthopedics;  Laterality: Left;   TONSILLECTOMY      SOCIAL HISTORY: Social History   Socioeconomic History   Marital status: Married    Spouse name: kenneth   Number of children: Not on file   Years of education: Not on file   Highest education level: Not on file  Occupational History   Not on file  Tobacco Use   Smoking status: Never   Smokeless tobacco: Never  Vaping Use   Vaping status: Never Used  Substance and Sexual Activity   Alcohol use: No   Drug use: No   Sexual activity: Not on file  Other Topics Concern   Not on file  Social History Narrative   Lives in [place] with husband. Daughter in [place]. No alcohol. Never smoked. Worked at [employer].    Social Determinants of Health   Financial Resource Strain: Medium Risk (07/27/2022)   Received from [facility]   Overall Financial Resource  Strain (CARDIA)    Difficulty of Paying Living Expenses: Somewhat hard  Food Insecurity: Food Insecurity Present (07/27/2022)   Received from [facility]   Hunger Vital Sign    Worried About Running Out of Food in the Last Year: Never true    Ran Out of Food in the Last Year: Sometimes true  Transportation Needs: No Transportation Needs (07/27/2022)   Received from [facility] - Transportation    In the past 12 months, has lack of transportation kept you from medical appointments or from getting medications?: No    Lack of Transportation (Non-Medical): No  Physical Activity: Not on file  Stress: Not on file  Social Connections: Not on file  Intimate Partner Violence: Not on file    FAMILY HISTORY: Family History  Problem Relation Age of Onset   Breast cancer Maternal Aunt    Breast cancer Cousin     ALLERGIES:  is allergic to bisacodyl, ciprofloxacin, procaine, penicillins, and sulfa antibiotics.  MEDICATIONS:  Current Outpatient Medications  Medication Sig Dispense Refill   acetaminophen (TYLENOL) 650 MG CR tablet Take 650 mg by mouth See admin instructions. Take 1 tablet (  650 mg) by mouth scheduled twice daily, may take an additional dose at mid afternoon if needed for pain.     amLODipine (NORVASC) 2.5 MG tablet Take 2.5 mg by mouth daily.     Aromatic Inhalants (VICKS BABYRUB) OINT Apply 1 application topically.      buPROPion (WELLBUTRIN) 75 MG tablet Take 75 mg by mouth daily.     cetirizine (ZYRTEC) 10 MG tablet Take 10 mg by mouth daily as needed for allergies.     Cyanocobalamin (VITAMIN B 12) 100 MCG LOZG Take by mouth.     docusate sodium (COLACE) 100 MG capsule Take 100-200 mg by mouth daily as needed for mild constipation.      DULoxetine (CYMBALTA) 60 MG capsule Take 60 mg by mouth daily.      fluticasone (FLONASE) 50 MCG/ACT nasal spray Place 1 spray into the nose daily as needed for allergies.       HYDROcodone-acetaminophen (NORCO/VICODIN) 5-325 MG tablet Take 1 tablet by mouth every 6 (six) hours as needed.     meclizine (ANTIVERT) 12.5 MG tablet Take 12.5 mg by mouth 3 (three) times daily as needed for dizziness (vertigo).      Menthol-Methyl Salicylate (MUSCLE RUB) 10-15 % CREA Apply 1 application topically 3 (three) times daily as needed (knee pain/arthritis.).      montelukast (SINGULAIR) 10 MG tablet Take 10 mg by mouth at bedtime.     omeprazole (PRILOSEC) 40 MG capsule Take 40 mg by mouth every evening.      polyethylene glycol (MIRALAX / GLYCOLAX) 17 g packet Take 17 g by mouth daily.     VITAMIN D PO Take 1,000 Int'l Units/day by mouth daily.     amLODipine (NORVASC) 5 MG tablet Take 2.5 mg by mouth daily.     clindamycin (CLEOCIN) 150 MG capsule Prior to dental work (Patient not taking: Reported on 10/17/2021)     No current facility-administered medications for this visit.    PHYSICAL EXAMINATION: Vitals:   12/11/22 1357  BP: 139/62  Temp: 98.2 F (36.8 C)   Filed Weights   12/11/22 1357  Weight: 140 lb (63.5 kg)   Physical Exam Vitals reviewed.  Constitutional:      Appearance: Normal appearance. She is not ill-appearing.  HENT:     Head: Normocephalic and atraumatic.  Cardiovascular:     Rate and Rhythm: Normal rate and regular rhythm.  Pulmonary:     Effort: Pulmonary effort is normal.     Breath sounds: No wheezing.  Abdominal:     General: There is no distension.     Palpations: Abdomen is soft.     Tenderness: There is no abdominal tenderness.  Musculoskeletal:        General: Normal range of motion.     Cervical back: Normal range of motion.  Skin:    General: Skin is warm and dry.     Coloration: Skin is not pale.     Comments: Steri-strips overlying incision of left ankle. Healing by secondary intention.   Neurological:     Mental Status: She is alert and oriented to person, place, and time.     Gait: Gait is intact.  Psychiatric:         Mood and Affect: Mood and affect normal.        Behavior: Behavior normal.        Cognition and Memory: Memory normal.     LABORATORY DATA:  I have reviewed the data as listed Lab  Results  Component Value Date   WBC 6.3 12/11/2022   HGB 11.2 (L) 12/11/2022   HCT 35.8 (L) 12/11/2022   MCV 87.7 12/11/2022   PLT 269 12/11/2022   Recent Labs    04/18/22 1319 07/24/22 1249 12/11/22 1341  NA 138 137 141  K 4.4 4.6 4.4  CL 107 106 108  CO2 24 24 26   GLUCOSE 106* 112* 113*  BUN 17 18 21   CREATININE 1.10* 1.19* 1.08*  CALCIUM 9.3 8.9 9.5  GFRNONAA 51* 46* 52*   Iron/TIBC/Ferritin/ %Sat    Component Value Date/Time   IRON 46 07/24/2022 1249   TIBC 494 (H) 07/24/2022 1249   FERRITIN 5 (L) 07/24/2022 1249   IRONPCTSAT 9 (L) 07/24/2022 1249     No results found.  Assessment & Plan:   # Iron deficient anemia-hemoglobin ~9/ferritin-7 [FEB 2022; PCP]; patient symptomatic.  Poor tolerance to p.o. iron.She received IV iron/venofer x 3, last 8.9.24. Tolerated well.   # Today, hmg 11.2. Iron studies and ferritin pending. Persistent symptoms. Recommend venofer today. Plan for additional doses based on iron levels.    # Etiology of iron deficiency anemia -unclear. EGD/colonosopy-  May, 2022 [Dr.Locklear]- No evidence of any GI blood loss. June 2024- Hemoccult- positive. Previously discussed capsule study however per GI/insurance, need to repeat colonoscopy and egd first. She has been scheduled in late January.   # B12 low- pills once a day [PCP].    # SCC - left lower extremity. s/p excision with Dr Cheree Ditto with clear margins.    # DISPOSITION: # venofer today then 3 additional doses (total of 4) over next 4-6 weeks. 3 mo- labs (cbc, bmp, ferritin, iron studies, b12), Dr Donneta Romberg, +/- venofer- la  No problem-specific Assessment & Plan notes found for this encounter.  Alinda Dooms, NP 12/11/2022

## 2022-12-12 ENCOUNTER — Encounter: Payer: Self-pay | Admitting: Dermatology

## 2022-12-19 ENCOUNTER — Inpatient Hospital Stay: Payer: Medicare PPO

## 2022-12-20 ENCOUNTER — Ambulatory Visit: Payer: Medicare PPO

## 2022-12-24 ENCOUNTER — Inpatient Hospital Stay: Payer: Medicare PPO

## 2022-12-24 VITALS — BP 130/54 | HR 72 | Resp 16

## 2022-12-24 DIAGNOSIS — E611 Iron deficiency: Secondary | ICD-10-CM

## 2022-12-24 DIAGNOSIS — D509 Iron deficiency anemia, unspecified: Secondary | ICD-10-CM | POA: Diagnosis not present

## 2022-12-24 MED ORDER — IRON SUCROSE 20 MG/ML IV SOLN
200.0000 mg | Freq: Once | INTRAVENOUS | Status: AC
Start: 2022-12-24 — End: 2022-12-24
  Administered 2022-12-24: 200 mg via INTRAVENOUS
  Filled 2022-12-24: qty 10

## 2022-12-24 NOTE — Progress Notes (Signed)
Pt tolerated treatment without complaints.  VSS.  Pt refused 30 minute post observation.  Pt understands risks.   

## 2022-12-28 ENCOUNTER — Inpatient Hospital Stay: Payer: Medicare PPO

## 2022-12-28 VITALS — BP 132/58 | HR 67 | Temp 99.3°F | Resp 19

## 2022-12-28 DIAGNOSIS — E611 Iron deficiency: Secondary | ICD-10-CM

## 2022-12-28 DIAGNOSIS — D509 Iron deficiency anemia, unspecified: Secondary | ICD-10-CM | POA: Diagnosis not present

## 2022-12-28 MED ORDER — IRON SUCROSE 20 MG/ML IV SOLN
200.0000 mg | Freq: Once | INTRAVENOUS | Status: AC
  Administered 2022-12-28: 200 mg via INTRAVENOUS

## 2022-12-28 MED ORDER — SODIUM CHLORIDE 0.9% FLUSH
10.0000 mL | Freq: Once | INTRAVENOUS | Status: AC | PRN
  Administered 2022-12-28: 10 mL
  Filled 2022-12-28: qty 10

## 2023-01-04 ENCOUNTER — Inpatient Hospital Stay: Payer: Medicare PPO | Attending: Internal Medicine

## 2023-01-04 VITALS — BP 118/62 | HR 72 | Temp 96.7°F | Resp 18

## 2023-01-04 DIAGNOSIS — D509 Iron deficiency anemia, unspecified: Secondary | ICD-10-CM | POA: Insufficient documentation

## 2023-01-04 DIAGNOSIS — E611 Iron deficiency: Secondary | ICD-10-CM

## 2023-01-04 MED ORDER — SODIUM CHLORIDE 0.9% FLUSH
10.0000 mL | Freq: Once | INTRAVENOUS | Status: AC | PRN
  Administered 2023-01-04: 10 mL
  Filled 2023-01-04: qty 10

## 2023-01-04 MED ORDER — IRON SUCROSE 20 MG/ML IV SOLN
200.0000 mg | Freq: Once | INTRAVENOUS | Status: AC
  Administered 2023-01-04: 200 mg via INTRAVENOUS
  Filled 2023-01-04: qty 10

## 2023-01-17 ENCOUNTER — Other Ambulatory Visit: Payer: Self-pay | Admitting: Infectious Diseases

## 2023-01-17 DIAGNOSIS — Z1231 Encounter for screening mammogram for malignant neoplasm of breast: Secondary | ICD-10-CM

## 2023-01-21 ENCOUNTER — Encounter
Admission: RE | Disposition: A | Discharge status: Home / Self Care | Payer: Self-pay | Source: Home / Self Care | Attending: Gastroenterology

## 2023-01-21 ENCOUNTER — Ambulatory Visit: Payer: Medicare PPO | Admitting: Certified Registered Nurse Anesthetist

## 2023-01-21 ENCOUNTER — Encounter: Payer: Self-pay | Admitting: *Deleted

## 2023-01-21 ENCOUNTER — Ambulatory Visit
Admission: RE | Admit: 2023-01-21 | Discharge: 2023-01-21 | Disposition: A | Discharge status: Home / Self Care | Payer: Medicare PPO | Attending: Gastroenterology | Admitting: Gastroenterology

## 2023-01-21 ENCOUNTER — Other Ambulatory Visit: Payer: Self-pay | Admitting: Gastroenterology

## 2023-01-21 DIAGNOSIS — K219 Gastro-esophageal reflux disease without esophagitis: Secondary | ICD-10-CM | POA: Insufficient documentation

## 2023-01-21 DIAGNOSIS — K921 Melena: Secondary | ICD-10-CM | POA: Insufficient documentation

## 2023-01-21 DIAGNOSIS — J449 Chronic obstructive pulmonary disease, unspecified: Secondary | ICD-10-CM | POA: Insufficient documentation

## 2023-01-21 DIAGNOSIS — D509 Iron deficiency anemia, unspecified: Secondary | ICD-10-CM | POA: Diagnosis present

## 2023-01-21 DIAGNOSIS — D122 Benign neoplasm of ascending colon: Secondary | ICD-10-CM | POA: Insufficient documentation

## 2023-01-21 DIAGNOSIS — I129 Hypertensive chronic kidney disease with stage 1 through stage 4 chronic kidney disease, or unspecified chronic kidney disease: Secondary | ICD-10-CM | POA: Insufficient documentation

## 2023-01-21 DIAGNOSIS — Q438 Other specified congenital malformations of intestine: Secondary | ICD-10-CM | POA: Diagnosis not present

## 2023-01-21 DIAGNOSIS — N189 Chronic kidney disease, unspecified: Secondary | ICD-10-CM | POA: Insufficient documentation

## 2023-01-21 DIAGNOSIS — F419 Anxiety disorder, unspecified: Secondary | ICD-10-CM | POA: Insufficient documentation

## 2023-01-21 DIAGNOSIS — K31819 Angiodysplasia of stomach and duodenum without bleeding: Secondary | ICD-10-CM | POA: Insufficient documentation

## 2023-01-21 DIAGNOSIS — Z90710 Acquired absence of both cervix and uterus: Secondary | ICD-10-CM | POA: Diagnosis not present

## 2023-01-21 HISTORY — PX: COLONOSCOPY WITH PROPOFOL: SHX5780

## 2023-01-21 HISTORY — PX: ESOPHAGOGASTRODUODENOSCOPY (EGD) WITH PROPOFOL: SHX5813

## 2023-01-21 SURGERY — COLONOSCOPY WITH PROPOFOL
Anesthesia: General

## 2023-01-21 MED ORDER — LIDOCAINE HCL (CARDIAC) PF 100 MG/5ML IV SOSY
PREFILLED_SYRINGE | INTRAVENOUS | Status: DC | PRN
  Administered 2023-01-21: 100 mg via INTRAVENOUS

## 2023-01-21 MED ORDER — SODIUM CHLORIDE 0.9 % IV SOLN: INTRAVENOUS | Status: DC

## 2023-01-21 MED ORDER — GLYCOPYRROLATE 0.2 MG/ML IJ SOLN
INTRAMUSCULAR | Status: DC | PRN
  Administered 2023-01-21: .2 mg via INTRAVENOUS

## 2023-01-21 MED ORDER — PROPOFOL 500 MG/50ML IV EMUL
INTRAVENOUS | Status: DC | PRN
  Administered 2023-01-21: 150 ug/kg/min via INTRAVENOUS

## 2023-01-21 MED ORDER — PROPOFOL 10 MG/ML IV BOLUS
INTRAVENOUS | Status: DC | PRN
  Administered 2023-01-21: 70 mg via INTRAVENOUS
  Administered 2023-01-21: 30 mg via INTRAVENOUS

## 2023-01-21 NOTE — Anesthesia Postprocedure Evaluation (Signed)
Anesthesia Post Note  Patient: Christine Ballard  Procedure(s) Performed: COLONOSCOPY WITH PROPOFOL ESOPHAGOGASTRODUODENOSCOPY (EGD) WITH PROPOFOL  Patient location during evaluation: PACU Anesthesia Type: General Level of consciousness: awake and awake and alert Pain management: satisfactory to patient Vital Signs Assessment: post-procedure vital signs reviewed and stable Respiratory status: spontaneous breathing and nonlabored ventilation Cardiovascular status: stable Anesthetic complications: no   No notable events documented.   Last Vitals:  Vitals:   01/21/23 1417 01/21/23 1427  BP: 97/64 (!) 161/68  Pulse: 70 66  Resp: 13 14  Temp:    SpO2: 98% 100%    Last Pain:  Vitals:   01/21/23 1427  TempSrc:   PainSc: 0-No pain                 VAN STAVEREN,Amya Hlad

## 2023-01-21 NOTE — Op Note (Signed)
[name] Procedure Date: 01/21/2023 1:26 PM MRN: [id number] Account #: [id number] Date of Birth: 05-09-1940 Admit Type: Outpatient Age: 83 Room: [facility] ENDO ROOM 3 Gender: Female Note Status: Supervisor Override Instrument Name: Patton Salles Endoscope [id number] Procedure:             Upper GI endoscopy Indications:           Iron deficiency anemia, Melena Providers:             Eather Colas MD, MD Referring MD:          Clydie Braun (Referring MD) Medicines:             Monitored Anesthesia Care Complications:         No immediate complications. Estimated blood loss:                         Minimal. Procedure:             Pre-Anesthesia Assessment:                        - Prior to the procedure, a History and Physical was                         performed, and patient medications and allergies were                         reviewed. The patient is competent. The risks and                         benefits of the procedure and the sedation options and                         risks were discussed with the patient. All questions                         were answered and informed consent was obtained.                         Patient identification and proposed procedure were                         verified by the physician, the nurse, the                         anesthesiologist, the anesthetist and the technician                         in the endoscopy suite. Mental Status Examination:                         alert and oriented. Airway Examination: normal                         oropharyngeal airway and neck mobility. Respiratory                         Examination: clear to auscultation. CV Examination:  normal. Prophylactic Antibiotics: The patient does not                         require prophylactic antibiotics. Prior                         Anticoagulants: The patient has taken no anticoagulant                          or antiplatelet agents. ASA Grade Assessment: III - A                         patient with severe systemic disease. After reviewing                         the risks and benefits, the patient was deemed in                         satisfactory condition to undergo the procedure. The                         anesthesia plan was to use monitored anesthesia care                         (MAC). Immediately prior to administration of                         medications, the patient was re-assessed for adequacy                         to receive sedatives. The heart rate, respiratory                         rate, oxygen saturations, blood pressure, adequacy of                         pulmonary ventilation, and response to care were                         monitored throughout the procedure. The physical                         status of the patient was re-assessed after the                         procedure.                        After obtaining informed consent, the endoscope was                         passed under direct vision. Throughout the procedure,                         the patient's blood pressure, pulse, and oxygen                         saturations were monitored continuously. The Endoscope  was introduced through the mouth, and advanced to the                         second part of duodenum. The upper GI endoscopy was                         accomplished without difficulty. The patient tolerated                         the procedure well. Findings:      The examined esophagus was normal.      A few small sessile fundic gland polyps with no bleeding and no stigmata       of recent bleeding were found in the gastric body.      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 Biopsies were taken with a cold forceps for histology.       Estimated blood loss was minimal.      The exam of the  stomach was otherwise normal.      The examined duodenum was normal. Impression:            - Normal esophagus.                        - A few fundic gland polyps.                        - Gastric antral vascular ectasia without bleeding.                         Biopsied.                        - Normal examined duodenum. Recommendation:        - Discharge patient to home.                        - Resume previous diet.                        - Continue present medications.                        - Await pathology results.                        - Return to referring physician as previously                         scheduled. Procedure Code(s):     --- Professional ---                        [phone], Esophagogastroduodenoscopy, flexible,                         transoral; with biopsy, single or multiple Diagnosis Code(s):     --- Professional ---                        K31.7, Polyp of stomach and duodenum  K31.819, Angiodysplasia of stomach and duodenum                         without bleeding                        D50.9, Iron deficiency anemia, unspecified CPT copyright 2022 American Medical Association. All rights reserved. The codes documented in this report are preliminary and upon coder review may  be revised to meet current compliance requirements. Eather Colas MD, MD 01/21/2023 2:06:33 PM Number of Addenda: 0 Note Initiated On: 01/21/2023 1:26 PM Estimated Blood Loss:  Estimated blood loss was minimal.      [facility]

## 2023-01-21 NOTE — Anesthesia Preprocedure Evaluation (Signed)
Anesthesia Evaluation  Patient identified by MRN, date of birth, ID band Patient awake    Reviewed: Allergy & Precautions, NPO status , Patient's Chart, lab work & pertinent test results  Airway Mallampati: III  TM Distance: >3 FB Neck ROM: full    Dental  (+) Teeth Intact   Pulmonary neg pulmonary ROS, shortness of breath and with exertion, asthma    Pulmonary exam normal breath sounds clear to auscultation       Cardiovascular Exercise Tolerance: Good hypertension, Pt. on medications negative cardio ROS Normal cardiovascular exam Rhythm:Regular Rate:Normal     Neuro/Psych  Headaches  Anxiety     negative neurological ROS  negative psych ROS   GI/Hepatic negative GI ROS, Neg liver ROS,GERD  Medicated,,  Endo/Other  negative endocrine ROS    Renal/GU negative Renal ROS  negative genitourinary   Musculoskeletal   Abdominal   Peds negative pediatric ROS (+)  Hematology negative hematology ROS (+) Blood dyscrasia, anemia   Anesthesia Other Findings Past Medical History: No date: Allergic rhinitis No date: Allergy-induced asthma     Comment:  seasonal allergies No date: Anxiety No date: Arthritis     Comment:  chronic hip pain,right No date: Cancer (HCC)     Comment:  skin on back No date: Cataracts, bilateral     Comment:  "small" No date: Chronic kidney disease     Comment:  mild renal insufficiency No date: Constipation No date: COPD (chronic obstructive pulmonary disease) (HCC) No date: Depression No date: Dyspnea No date: GERD (gastroesophageal reflux disease) No date: Headache     Comment:  occular No date: History of bronchitis     Comment:  "about twice a year" No date: History of hematuria No date: Hypertension No date: Hypoglycemia     Comment:  distant past No date: Osteopenia No date: Osteopenia No date: Torn meniscus     Comment:  bilateral No date: Vertigo     Comment:  2-3x/yr No  date: Wears glasses  Past Surgical History: No date: ABDOMINAL HYSTERECTOMY 06/26/2016: ANTERIOR CERVICAL DECOMP/DISCECTOMY FUSION; N/A     Comment:  Procedure: Cervical Two-Three, Cervical Three-Four,               Cervical Four-Five, Cervical Five-Six Anterior Discectomy              with Fusion and Plate Fixation;  Surgeon: Ditty, Loura Halt, MD;  Location: [facility] OR;  Service: Neurosurgery;                Laterality: N/A;  C3-6 Anterior discectomy with fusion               and plate fixation No date: BASAL CELL CARCINOMA EXCISION     Comment:  on back No date: BLEPHAROPLASTY No date: CARPAL TUNNEL RELEASE; Bilateral 02/03/2019: CATARACT EXTRACTION W/PHACO; Right     Comment:  Procedure: CATARACT EXTRACTION PHACO AND INTRAOCULAR               LENS PLACEMENT (IOC) RIGHT 5.39 00:38.1;  Surgeon:               Galen Manila, MD;  Location: [facility];                Service: Ophthalmology;  Laterality: Right; 02/24/2019: CATARACT EXTRACTION W/PHACO; Left     Comment:  Procedure: CATARACT EXTRACTION PHACO AND INTRAOCULAR  LENS PLACEMENT (IOC)  LEFT;  Surgeon: Galen Manila,               MD;  Location: [facility];  Service:               Ophthalmology;  Laterality: Left;  CDE 4.86 U/S 0:26.1 No date: COLONOSCOPY 10/16/2016: COLONOSCOPY WITH PROPOFOL; N/A     Comment:  Procedure: COLONOSCOPY WITH PROPOFOL;  Surgeon:               Christena Deem, MD;  Location: [facility] ENDOSCOPY;                Service: Endoscopy;  Laterality: N/A; 05/27/2020: COLONOSCOPY WITH PROPOFOL; N/A     Comment:  Procedure: COLONOSCOPY WITH PROPOFOL;  Surgeon:               Regis Bill, MD;  Location: [facility] ENDOSCOPY;                Service: Endoscopy;  Laterality: N/A; 05/27/2020: ESOPHAGOGASTRODUODENOSCOPY (EGD) WITH PROPOFOL; N/A     Comment:  Procedure: ESOPHAGOGASTRODUODENOSCOPY (EGD) WITH               PROPOFOL;  Surgeon: Regis Bill,  MD;  Location:               [facility] ENDOSCOPY;  Service: Endoscopy;  Laterality: N/A; No date: EYE SURGERY No date: iron infusions No date: NASAL SINUS SURGERY 12/02/2019: ORIF WRIST FRACTURE; Left     Comment:  Procedure: OPEN REDUCTION INTERNAL FIXATION (ORIF) WRIST              FRACTURE;  Surgeon: Christena Flake, MD;  Location: [facility]               ORS;  Service: Orthopedics;  Laterality: Left; 05/26/2019: PARTIAL KNEE ARTHROPLASTY; Right     Comment:  Procedure: UNICOMPARTMENTAL KNEE;  Surgeon: Christena Flake, MD;  Location: [facility] ORS;  Service: Orthopedics;                Laterality: Right; 07/10/2018: REPAIR EXTENSOR TENDON; Left     Comment:  Procedure: REPAIR EXTENSOR TENDON LEFT LONG FINGER               REALIGNMENT;  Surgeon: Kennedy Bucker, MD;  Location: [facility]              ORS;  Service: Orthopedics;  Laterality: Left; No date: TONSILLECTOMY  BMI    Body Mass Index: 25.24 kg/m      Reproductive/Obstetrics negative OB ROS                             Anesthesia Physical Anesthesia Plan  ASA: 3  Anesthesia Plan: General   Post-op Pain Management:    Induction: Intravenous  PONV Risk Score and Plan: Propofol infusion and TIVA  Airway Management Planned: Natural Airway and Nasal Cannula  Additional Equipment:   Intra-op Plan:   Post-operative Plan:   Informed Consent: I have reviewed the patients History and Physical, chart, labs and discussed the procedure including the risks, benefits and alternatives for the proposed anesthesia with the patient or authorized representative who has indicated his/her understanding and acceptance.     Dental Advisory Given  Plan Discussed with: CRNA  Anesthesia Plan Comments:        Anesthesia Quick Evaluation

## 2023-01-21 NOTE — H&P (Signed)
Outpatient short stay form Pre-procedure 01/21/2023  Regis Bill, MD  Primary Physician: Mick Sell, MD  Reason for visit:  IDA  History of present illness:    83 y/o lady with history of hypertension, COPD, and CKD here for EGD/Colonoscopy for IDA. Last EGD/Colonoscopy in 2022 was unremarkable. No blood thinners. No first degree relatives with GI malignancies. History of hysterectomy.    Current Facility-Administered Medications:    0.9 %  sodium chloride infusion, , Intravenous, Continuous, Tais Koestner, Rossie Muskrat, MD, Last Rate: 20 mL/hr at 01/21/23 1257, New Bag at 01/21/23 1257  Medications Prior to Admission  Medication Sig Dispense Refill Last Dose/Taking   acetaminophen (TYLENOL) 650 MG CR tablet Take 650 mg by mouth See admin instructions. Take 1 tablet (650 mg) by mouth scheduled twice daily, may take an additional dose at mid afternoon if needed for pain.   01/21/2023   amLODipine (NORVASC) 2.5 MG tablet Take 2.5 mg by mouth daily.   01/21/2023   Aromatic Inhalants (VICKS BABYRUB) OINT Apply 1 application topically.       buPROPion (WELLBUTRIN) 75 MG tablet Take 75 mg by mouth daily.      busPIRone (BUSPAR) 5 MG tablet Take 5 mg by mouth 2 (two) times daily.      cetirizine (ZYRTEC) 10 MG tablet Take 10 mg by mouth daily as needed for allergies.      clindamycin (CLEOCIN) 150 MG capsule Prior to dental work (Patient not taking: Reported on 10/17/2021)      Cyanocobalamin (VITAMIN B 12) 100 MCG LOZG Take by mouth.      docusate sodium (COLACE) 100 MG capsule Take 100-200 mg by mouth daily as needed for mild constipation.       DULoxetine (CYMBALTA) 60 MG capsule Take 60 mg by mouth daily.       fluticasone (FLONASE) 50 MCG/ACT nasal spray Place 1 spray into the nose daily as needed for allergies.       HYDROcodone-acetaminophen (NORCO/VICODIN) 5-325 MG tablet Take 1 tablet by mouth every 6 (six) hours as needed.      meclizine (ANTIVERT) 12.5 MG tablet Take 12.5 mg  by mouth 3 (three) times daily as needed for dizziness (vertigo).       Menthol-Methyl Salicylate (MUSCLE RUB) 10-15 % CREA Apply 1 application topically 3 (three) times daily as needed (knee pain/arthritis.).       montelukast (SINGULAIR) 10 MG tablet Take 10 mg by mouth at bedtime.      Na Sulfate-K Sulfate-Mg Sulf 17.5-3.13-1.6 GM/177ML SOLN Take by mouth.      omeprazole (PRILOSEC) 40 MG capsule Take 40 mg by mouth every evening.       polyethylene glycol (MIRALAX / GLYCOLAX) 17 g packet Take 17 g by mouth daily.      VITAMIN D PO Take 1,000 Int'l Units/day by mouth daily.        Allergies  Allergen Reactions   Bisacodyl Other (See Comments)    Extreme cramping.   Ciprofloxacin Other (See Comments)    Muscle pain and stiffness   Procaine Other (See Comments)    syncope   Penicillins Rash    Did it involve swelling of the face/tongue/throat, SOB, or low BP? No Did it involve sudden or severe rash/hives, skin peeling, or any reaction on the inside of your mouth or nose? Yes Did you need to seek medical attention at a hospital or doctor's office? Yes When did it last happen?      childhood  allergy If all above answers are "NO", may proceed with cephalosporin use.    Sulfa Antibiotics Rash     Past Medical History:  Diagnosis Date   Allergic rhinitis    Allergy-induced asthma    seasonal allergies   Anxiety    Arthritis    chronic hip pain,right   Cancer (HCC)    skin on back   Cataracts, bilateral    "small"   Chronic kidney disease    mild renal insufficiency   Constipation    COPD (chronic obstructive pulmonary disease) (HCC)    Depression    Dyspnea    GERD (gastroesophageal reflux disease)    Headache    occular   History of bronchitis    "about twice a year"   History of hematuria    Hypertension    Hypoglycemia    distant past   Osteopenia    Osteopenia    Torn meniscus    bilateral   Vertigo    2-3x/yr   Wears glasses     Review of systems:   Otherwise negative.    Physical Exam  Gen: Alert, oriented. Appears stated age.  HEENT: PERRLA. Lungs: No respiratory distress CV: RRR Abd: soft, benign, no masses Ext: No edema    Planned procedures: Proceed with EGD/colonoscopy. The patient understands the nature of the planned procedure, indications, risks, alternatives and potential complications including but not limited to bleeding, infection, perforation, damage to internal organs and possible oversedation/side effects from anesthesia. The patient agrees and gives consent to proceed.  Please refer to procedure notes for findings, recommendations and patient disposition/instructions.     Regis Bill, MD [facility]

## 2023-01-21 NOTE — Interval H&P Note (Signed)
History and Physical Interval Note:  01/21/2023 1:31 PM  Christine Ballard  has presented today for surgery, with the diagnosis of D50.9 (ICD-10-CM) - Iron deficiency anemia, unspecified iron deficiency anemia type K92.1 (ICD-10-CM) - Melena.  The various methods of treatment have been discussed with the patient and family. After consideration of risks, benefits and other options for treatment, the patient has consented to  Procedure(s): COLONOSCOPY WITH PROPOFOL (N/A) ESOPHAGOGASTRODUODENOSCOPY (EGD) WITH PROPOFOL (N/A) as a surgical intervention.  The patient's history has been reviewed, patient examined, no change in status, stable for surgery.  I have reviewed the patient's chart and labs.  Questions were answered to the patient's satisfaction.     Regis Bill  Ok to proceed with EGD/Colonoscopy

## 2023-01-21 NOTE — Transfer of Care (Signed)
Immediate Anesthesia Transfer of Care Note  Patient: Christine Ballard  Procedure(s) Performed: COLONOSCOPY WITH PROPOFOL ESOPHAGOGASTRODUODENOSCOPY (EGD) WITH PROPOFOL  Patient Location: Endoscopy Unit  Anesthesia Type:General  Level of Consciousness: drowsy  Airway & Oxygen Therapy: Patient Spontanous Breathing  Post-op Assessment: Report given to RN and Post -op Vital signs reviewed and stable  Post vital signs: Reviewed and stable  Last Vitals:  Vitals Value Taken Time  BP 92/42 01/21/23 1406  Temp    Pulse 65 01/21/23 1406  Resp 18 01/21/23 1406  SpO2 96 % 01/21/23 1406  Vitals shown include unfiled device data.  Last Pain:  Vitals:   01/21/23 1249  TempSrc: Temporal  PainSc: 0-No pain         Complications: No notable events documented.

## 2023-01-21 NOTE — Op Note (Signed)
[facility] Patient Name: Christine Ballard Procedure Date: 01/21/2023 1:25 PM MRN: [id number] Account #: [id number] Date of Birth: [birth year] Admit Type: Outpatient Age: 83 Room: [facility] ENDO ROOM 3 Gender: Female Note Status: Finalized Instrument Name: Nelda Marseille [id number] Procedure:             Colonoscopy Indications:           Iron deficiency anemia Providers:             Eather Colas MD, MD Referring MD:          Clydie Braun (Referring MD) Medicines:             Monitored Anesthesia Care Complications:         No immediate complications. Estimated blood loss:                         Minimal. Procedure:             Pre-Anesthesia Assessment:                        - Prior to the procedure, a History and Physical was                         performed, and patient medications and allergies were                         reviewed. The patient is competent. The risks and                         benefits of the procedure and the sedation options and                         risks were discussed with the patient. All questions                         were answered and informed consent was obtained.                         Patient identification and proposed procedure were                         verified by the physician, the nurse, the                         anesthesiologist, the anesthetist and the technician                         in the endoscopy suite. Mental Status Examination:                         alert and oriented. Airway Examination: normal                         oropharyngeal airway and neck mobility. Respiratory                         Examination: clear to auscultation. CV Examination:  normal. Prophylactic Antibiotics: The patient does not                         require prophylactic antibiotics. Prior                         Anticoagulants: The patient has taken no anticoagulant                         or  antiplatelet agents. ASA Grade Assessment: III - A                         patient with severe systemic disease. After reviewing                         the risks and benefits, the patient was deemed in                         satisfactory condition to undergo the procedure. The                         anesthesia plan was to use monitored anesthesia care                         (MAC). Immediately prior to administration of                         medications, the patient was re-assessed for adequacy                         to receive sedatives. The heart rate, respiratory                         rate, oxygen saturations, blood pressure, adequacy of                         pulmonary ventilation, and response to care were                         monitored throughout the procedure. The physical                         status of the patient was re-assessed after the                         procedure.                        After obtaining informed consent, the colonoscope was                         passed under direct vision. Throughout the procedure,                         the patient's blood pressure, pulse, and oxygen                         saturations were monitored continuously. The  Colonoscope was introduced through the anus and                         advanced to the the terminal ileum, with                         identification of the appendiceal orifice and IC                         valve. The colonoscopy was somewhat difficult due to                         significant looping. Successful completion of the                         procedure was aided by applying abdominal pressure.                         The patient tolerated the procedure well. The quality                         of the bowel preparation was good. The terminal ileum,                         ileocecal valve, appendiceal orifice, and rectum were                          photographed. Findings:      The perianal and digital rectal examinations were normal.      The terminal ileum appeared normal.      A 2 mm polyp was found in the ascending colon. The polyp was sessile.       The polyp was removed with a jumbo cold forceps. Resection and retrieval       were complete. Estimated blood loss was minimal.      The exam was otherwise without abnormality. Impression:            - The examined portion of the ileum was normal.                        - One 2 mm polyp in the ascending colon, removed with                         a jumbo cold forceps. Resected and retrieved.                        - The examination was otherwise normal. Recommendation:        - Discharge patient to home.                        - Resume previous diet.                        - Continue present medications.                        - Await pathology results.                        -  Repeat colonoscopy is not recommended due to current                         age (37 years or older) for surveillance.                        - Return to referring physician as previously                         scheduled. Procedure Code(s):     --- Professional ---                        [phone], Colonoscopy, flexible; with biopsy, single or                         multiple Diagnosis Code(s):     --- Professional ---                        D12.2, Benign neoplasm of ascending colon                        D50.9, Iron deficiency anemia, unspecified CPT copyright 2022 American Medical Association. All rights reserved. The codes documented in this report are preliminary and upon coder review may  be revised to meet current compliance requirements. Eather Colas MD, MD 01/21/2023 2:09:58 PM Number of Addenda: 0 Note Initiated On: 01/21/2023 1:25 PM Scope Withdrawal Time: 0 hours 9 minutes 13 seconds  Total Procedure Duration: 0 hours 14 minutes 14 seconds  Estimated Blood Loss:  Estimated blood loss was  minimal.      [facility]

## 2023-01-22 ENCOUNTER — Encounter: Payer: Self-pay | Admitting: Gastroenterology

## 2023-01-22 LAB — SURGICAL PATHOLOGY

## 2023-01-29 ENCOUNTER — Ambulatory Visit
Admission: RE | Admit: 2023-01-29 | Discharge: 2023-01-29 | Disposition: A | Discharge status: Home / Self Care | Payer: Medicare PPO | Source: Ambulatory Visit | Attending: Infectious Diseases | Admitting: Infectious Diseases

## 2023-01-29 DIAGNOSIS — Z1231 Encounter for screening mammogram for malignant neoplasm of breast: Secondary | ICD-10-CM | POA: Diagnosis present

## 2023-03-11 ENCOUNTER — Other Ambulatory Visit: Payer: Self-pay | Admitting: *Deleted

## 2023-03-11 DIAGNOSIS — D509 Iron deficiency anemia, unspecified: Secondary | ICD-10-CM

## 2023-03-12 ENCOUNTER — Inpatient Hospital Stay: Payer: Medicare PPO

## 2023-03-12 ENCOUNTER — Inpatient Hospital Stay: Payer: Medicare PPO | Attending: Internal Medicine

## 2023-03-12 ENCOUNTER — Inpatient Hospital Stay: Payer: Medicare PPO | Admitting: Internal Medicine

## 2023-03-12 VITALS — BP 136/76 | HR 65 | Temp 97.8°F | Resp 19 | Wt 136.6 lb

## 2023-03-12 VITALS — BP 145/79 | HR 57

## 2023-03-12 DIAGNOSIS — E611 Iron deficiency: Secondary | ICD-10-CM | POA: Insufficient documentation

## 2023-03-12 DIAGNOSIS — D509 Iron deficiency anemia, unspecified: Secondary | ICD-10-CM

## 2023-03-12 LAB — CBC WITH DIFFERENTIAL (CANCER CENTER ONLY)
Abs Immature Granulocytes: 0.02 10*3/uL (ref 0.00–0.07)
Basophils Absolute: 0 10*3/uL (ref 0.0–0.1)
Basophils Relative: 1 %
Eosinophils Absolute: 0.1 10*3/uL (ref 0.0–0.5)
Eosinophils Relative: 2 %
HCT: 37.6 % (ref 36.0–46.0)
Hemoglobin: 12.2 g/dL (ref 12.0–15.0)
Immature Granulocytes: 0 %
Lymphocytes Relative: 28 %
Lymphs Abs: 1.7 10*3/uL (ref 0.7–4.0)
MCH: 29.9 pg (ref 26.0–34.0)
MCHC: 32.4 g/dL (ref 30.0–36.0)
MCV: 92.2 fL (ref 80.0–100.0)
Monocytes Absolute: 0.6 10*3/uL (ref 0.1–1.0)
Monocytes Relative: 9 %
Neutro Abs: 3.6 10*3/uL (ref 1.7–7.7)
Neutrophils Relative %: 60 %
Platelet Count: 239 10*3/uL (ref 150–400)
RBC: 4.08 MIL/uL (ref 3.87–5.11)
RDW: 14.3 % (ref 11.5–15.5)
WBC Count: 6 10*3/uL (ref 4.0–10.5)
nRBC: 0 % (ref 0.0–0.2)

## 2023-03-12 LAB — BASIC METABOLIC PANEL - CANCER CENTER ONLY
Anion gap: 6 (ref 5–15)
BUN: 18 mg/dL (ref 8–23)
CO2: 26 mmol/L (ref 22–32)
Calcium: 9.4 mg/dL (ref 8.9–10.3)
Chloride: 106 mmol/L (ref 98–111)
Creatinine: 1.15 mg/dL — ABNORMAL HIGH (ref 0.44–1.00)
GFR, Estimated: 48 mL/min — ABNORMAL LOW (ref >60)
Glucose, Bld: 106 mg/dL — ABNORMAL HIGH (ref 70–99)
Potassium: 4.6 mmol/L (ref 3.5–5.1)
Sodium: 138 mmol/L (ref 135–145)

## 2023-03-12 LAB — VITAMIN B12: Vitamin B-12: 298 pg/mL (ref 180–914)

## 2023-03-12 LAB — IRON AND TIBC
Iron: 92 ug/dL (ref 28–170)
Saturation Ratios: 25 % (ref 10.4–31.8)
TIBC: 371 ug/dL (ref 250–450)
UIBC: 279 ug/dL

## 2023-03-12 LAB — FERRITIN: Ferritin: 69 ng/mL (ref 11–307)

## 2023-03-12 MED ORDER — IRON SUCROSE 20 MG/ML IV SOLN
200.0000 mg | Freq: Once | INTRAVENOUS | Status: AC
Start: 2023-03-12 — End: 2023-03-12
  Administered 2023-03-12: 200 mg via INTRAVENOUS

## 2023-03-12 NOTE — Progress Notes (Signed)
 [facility] CONSULT NOTE  Patient Care Team: Mick Sell, MD as PCP - General (Internal Medicine) Earna Coder, MD as Consulting Physician (Internal Medicine) Stanton Kidney, MD as Consulting Physician (Gastroenterology)  CHIEF COMPLAINTS/PURPOSE OF CONSULTATION: ANEMIA   HEMATOLOGY HISTORY:  # MICROCYTIC ANEMIA [FEB 2022- Hb 9.6; Ferritin-6] > 25 years Hx of hematuria [[place]; urology]-cystoscopy;  EGD-/ colonoscopy-2022 polyps ; capsule-Bone marrow Biopsy-NONE; JAN 2025- EGD- [facility]  HISTORY OF PRESENTING ILLNESS: Ambulating independently.  Alone.   Christine Ballard 83 y.o.  female iron deficient anemia of from chronic GI bleed/[facility] is here for follow-up.  Patient doesn't feel as bad as she did the last time she saw Dr B. Pt does have fatigue.   Pt is taking care of grandchildren and her husband. Does have dizziness at times. Dyspnea, mild with exertion.     Review of Systems  Constitutional:  Positive for malaise/fatigue. Negative for chills, fever and weight loss.  HENT:  Negative for congestion, ear pain and tinnitus.   Eyes: Negative.  Negative for blurred vision and double vision.  Respiratory:  Positive for shortness of breath. Negative for cough and sputum production.   Cardiovascular: Negative.  Negative for chest pain, palpitations and leg swelling.  Gastrointestinal: Negative.  Negative for abdominal pain, constipation, diarrhea, nausea and vomiting.  Genitourinary:  Positive for flank pain. Negative for dysuria, frequency and urgency.  Musculoskeletal:  Negative for back pain and falls.  Skin: Negative.  Negative for rash.  Neurological: Negative.  Negative for weakness and headaches.  Endo/Heme/Allergies: Negative.  Does not bruise/bleed easily.  Psychiatric/Behavioral: Negative.  Negative for depression. The patient is not nervous/anxious and does not have insomnia.     MEDICAL HISTORY:  Past Medical History:  Diagnosis Date    Allergic rhinitis    Allergy-induced asthma    seasonal allergies   Anxiety    Arthritis    chronic hip pain,right   Cancer (HCC)    skin on back   Cataracts, bilateral    "small"   Chronic kidney disease    mild renal insufficiency   Constipation    COPD (chronic obstructive pulmonary disease) (HCC)    Depression    Dyspnea    GERD (gastroesophageal reflux disease)    Headache    occular   History of bronchitis    "about twice a year"   History of hematuria    Hypertension    Hypoglycemia    distant past   Osteopenia    Osteopenia    Torn meniscus    bilateral   Vertigo    2-3x/yr   Wears glasses     SURGICAL HISTORY: Past Surgical History:  Procedure Laterality Date   ABDOMINAL HYSTERECTOMY     ANTERIOR CERVICAL DECOMP/DISCECTOMY FUSION N/A 06/26/2016   Procedure: Cervical Two-Three, Cervical Three-Four, Cervical Four-Five, Cervical Five-Six Anterior Discectomy with Fusion and Plate Fixation;  Surgeon: Ditty, Loura Halt, MD;  Location: [facility] OR;  Service: Neurosurgery;  Laterality: N/A;  C3-6 Anterior discectomy with fusion and plate fixation   BASAL CELL CARCINOMA EXCISION     on back   BLEPHAROPLASTY     CARPAL TUNNEL RELEASE Bilateral    CATARACT EXTRACTION W/PHACO Right 02/03/2019   Procedure: CATARACT EXTRACTION PHACO AND INTRAOCULAR LENS PLACEMENT (IOC) RIGHT 5.39 00:38.1;  Surgeon: Galen Manila, MD;  Location: [facility];  Service: Ophthalmology;  Laterality: Right;   CATARACT EXTRACTION W/PHACO Left 02/24/2019   Procedure: CATARACT EXTRACTION PHACO AND INTRAOCULAR LENS  PLACEMENT (IOC)  LEFT;  Surgeon: Galen Manila, MD;  Location: [facility];  Service: Ophthalmology;  Laterality: Left;  CDE 4.86 U/S 0:26.1   COLONOSCOPY     COLONOSCOPY WITH PROPOFOL N/A 10/16/2016   Procedure: COLONOSCOPY WITH PROPOFOL;  Surgeon: Christena Deem, MD;  Location: [facility] ENDOSCOPY;  Service: Endoscopy;  Laterality: N/A;   COLONOSCOPY WITH  PROPOFOL N/A 05/27/2020   Procedure: COLONOSCOPY WITH PROPOFOL;  Surgeon: Regis Bill, MD;  Location: [facility] ENDOSCOPY;  Service: Endoscopy;  Laterality: N/A;   COLONOSCOPY WITH PROPOFOL N/A 01/21/2023   Procedure: COLONOSCOPY WITH PROPOFOL;  Surgeon: Regis Bill, MD;  Location: [facility] ENDOSCOPY;  Service: Endoscopy;  Laterality: N/A;   ESOPHAGOGASTRODUODENOSCOPY (EGD) WITH PROPOFOL N/A 05/27/2020   Procedure: ESOPHAGOGASTRODUODENOSCOPY (EGD) WITH PROPOFOL;  Surgeon: Regis Bill, MD;  Location: [facility] ENDOSCOPY;  Service: Endoscopy;  Laterality: N/A;   ESOPHAGOGASTRODUODENOSCOPY (EGD) WITH PROPOFOL N/A 01/21/2023   Procedure: ESOPHAGOGASTRODUODENOSCOPY (EGD) WITH PROPOFOL;  Surgeon: Regis Bill, MD;  Location: [facility] ENDOSCOPY;  Service: Endoscopy;  Laterality: N/A;   EYE SURGERY     iron infusions     NASAL SINUS SURGERY     ORIF WRIST FRACTURE Left 12/02/2019   Procedure: OPEN REDUCTION INTERNAL FIXATION (ORIF) WRIST FRACTURE;  Surgeon: Christena Flake, MD;  Location: [facility] ORS;  Service: Orthopedics;  Laterality: Left;   PARTIAL KNEE ARTHROPLASTY Right 05/26/2019   Procedure: UNICOMPARTMENTAL KNEE;  Surgeon: Christena Flake, MD;  Location: [facility] ORS;  Service: Orthopedics;  Laterality: Right;   REPAIR EXTENSOR TENDON Left 07/10/2018   Procedure: REPAIR EXTENSOR TENDON LEFT LONG FINGER REALIGNMENT;  Surgeon: Kennedy Bucker, MD;  Location: [facility] ORS;  Service: Orthopedics;  Laterality: Left;   TONSILLECTOMY      SOCIAL HISTORY: Social History   Socioeconomic History   Marital status: Married    Spouse name: kenneth   Number of children: Not on file   Years of education: Not on file   Highest education level: Not on file  Occupational History   Not on file  Tobacco Use   Smoking status: Never   Smokeless tobacco: Never  Vaping Use   Vaping status: Never Used  Substance and Sexual Activity   Alcohol use: No   Drug use: No   Sexual activity: Not on file  Other  Topics Concern   Not on file  Social History Narrative   Lives in [place] with husband. Daughter in [place]. No alcohol. Never smoked. Worked at [employer].    Social Drivers of Corporate investment banker Strain: Low Risk  (01/28/2023)   Received from [facility]   Overall Financial Resource Strain (CARDIA)    Difficulty of Paying Living Expenses: Not hard at all  Food Insecurity: No Food Insecurity (01/28/2023)   Received from [facility]   Hunger Vital Sign    Worried About Running Out of Food in the Last Year: Never true    Ran Out of Food in the Last Year: Never true  Transportation Needs: No Transportation Needs (01/28/2023)   Received from [facility] - Transportation    In the past 12 months, has lack of transportation kept you from medical appointments or from getting medications?: No    Lack of Transportation (Non-Medical): No  Physical Activity: Not on file  Stress: Not on file  Social Connections: Not on file  Intimate Partner Violence: Not on file    FAMILY  HISTORY: Family History  Problem Relation Age of Onset   Breast cancer Maternal Aunt    Breast cancer Cousin     ALLERGIES:  is allergic to bisacodyl, ciprofloxacin, procaine, penicillins, and sulfa antibiotics.  MEDICATIONS:  Current Outpatient Medications  Medication Sig Dispense Refill   acetaminophen (TYLENOL) 650 MG CR tablet Take 650 mg by mouth See admin instructions. Take 1 tablet (650 mg) by mouth scheduled twice daily, may take an additional dose at mid afternoon if needed for pain.     albuterol (VENTOLIN HFA) 108 (90 Base) MCG/ACT inhaler 2 puffs every 4 (four) hours as needed.     amLODipine (NORVASC) 2.5 MG tablet Take 2.5 mg by mouth daily.     Aromatic Inhalants (VICKS BABYRUB) OINT Apply 1 application topically.      busPIRone (BUSPAR) 5 MG tablet Take 5 mg by mouth 2 (two) times daily.      cetirizine (ZYRTEC) 10 MG tablet Take 10 mg by mouth daily as needed for allergies.     Cyanocobalamin (VITAMIN B 12) 100 MCG LOZG Take by mouth.     docusate sodium (COLACE) 100 MG capsule Take 100-200 mg by mouth daily as needed for mild constipation.      DULoxetine (CYMBALTA) 60 MG capsule Take 60 mg by mouth daily.      fluticasone (FLONASE) 50 MCG/ACT nasal spray Place 1 spray into the nose daily as needed for allergies.      meclizine (ANTIVERT) 12.5 MG tablet Take 12.5 mg by mouth 3 (three) times daily as needed for dizziness (vertigo).      Menthol-Methyl Salicylate (MUSCLE RUB) 10-15 % CREA Apply 1 application topically 3 (three) times daily as needed (knee pain/arthritis.).      montelukast (SINGULAIR) 10 MG tablet Take 10 mg by mouth at bedtime.     omeprazole (PRILOSEC) 40 MG capsule Take 40 mg by mouth every evening.      polyethylene glycol (MIRALAX / GLYCOLAX) 17 g packet Take 17 g by mouth daily.     VITAMIN D PO Take 1,000 Int'l Units/day by mouth daily.     clindamycin (CLEOCIN) 150 MG capsule Prior to dental work (Patient not taking: Reported on 03/12/2023)     No current facility-administered medications for this visit.      PHYSICAL EXAMINATION:   Vitals:   03/12/23 1312  BP: 136/76  Pulse: 65  Resp: 19  Temp: 97.8 F (36.6 C)  SpO2: 100%    Filed Weights   03/12/23 1312  Weight: 136 lb 9.6 oz (62 kg)     Physical Exam Constitutional:      Appearance: Normal appearance.  HENT:     Head: Normocephalic and atraumatic.  Eyes:     Pupils: Pupils are equal, round, and reactive to light.  Cardiovascular:     Rate and Rhythm: Normal rate and regular rhythm.     Heart sounds: Normal heart sounds. No murmur heard. Pulmonary:     Effort: Pulmonary effort is normal.     Breath sounds: Normal breath sounds. No wheezing.  Abdominal:     General: Bowel sounds are normal. There is no distension.     Palpations: Abdomen is soft.     Tenderness: There is no  abdominal tenderness.  Musculoskeletal:        General: Normal range of motion.     Cervical back: Normal range of motion.  Skin:    General: Skin is warm and dry.     Findings:  No rash.  Neurological:     Mental Status: She is alert and oriented to person, place, and time.     Gait: Gait is intact.  Psychiatric:        Mood and Affect: Mood and affect normal.        Cognition and Memory: Memory normal.        Judgment: Judgment normal.     LABORATORY DATA:  I have reviewed the data as listed Lab Results  Component Value Date   WBC 6.0 03/12/2023   HGB 12.2 03/12/2023   HCT 37.6 03/12/2023   MCV 92.2 03/12/2023   PLT 239 03/12/2023   Recent Labs    07/24/22 1249 12/11/22 1341 03/12/23 1252  NA 137 141 138  K 4.6 4.4 4.6  CL 106 108 106  CO2 24 26 26   GLUCOSE 112* 113* 106*  BUN 18 21 18   CREATININE 1.19* 1.08* 1.15*  CALCIUM 8.9 9.5 9.4  GFRNONAA 46* 52* 48*      No results found.  Iron deficiency #Iron deficient anemia-hemoglobin ~9/ferritin-7 [FEB 2022; PCP]; patient symptomatic.  Poor tolerance to p.o. iron.  # Today- Hb- 12.2 - proceed with IV iron infusion today.   # Etiology of iron deficiency anemia -unclear. EGD- JAN 2025- [facility] [Dr.Locklear]-   CT JAN 2024- NED. NO  Capsule study.   # B12 low- pills once a day [PCP].    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   # DISPOSITION: # venofer today # Follow up in 3  months-MD; labs- cbc/bmp;b12 levels iron studies;ferritin; -possible venofer- Dr.B       Earna Coder, MD 03/12/2023 2:16 PM

## 2023-03-12 NOTE — Assessment & Plan Note (Addendum)
#  Iron deficient anemia-hemoglobin ~9/ferritin-7 [FEB 2022; PCP]; patient symptomatic.  Poor tolerance to p.o. iron.  # Today- Hb- 12.2 - proceed with IV iron infusion today.   # Etiology of iron deficiency anemia -unclear. EGD- JAN 2025- [facility] [Dr.Locklear]-   CT JAN 2024- NED. NO  Capsule study.   # B12 low- pills once a day [PCP].    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   # DISPOSITION: # venofer today # Follow up in 3  months-MD; labs- cbc/bmp;b12 levels iron studies;ferritin; -possible venofer- Dr.B

## 2023-03-12 NOTE — Patient Instructions (Signed)
 [facility] - A DEPT OF [facility]  Discharge Instructions: Thank you for choosing [facility] to provide your oncology and hematology care.  If you have a lab appointment with the Cancer Center, please go directly to the Cancer Center and check in at the registration area.  Wear comfortable clothing and clothing appropriate for easy access to any Portacath or PICC line.   We strive to give you quality time with your provider. You may need to reschedule your appointment if you arrive late (15 or more minutes).  Arriving late affects you and other patients whose appointments are after yours.  Also, if you miss three or more appointments without notifying the office, you may be dismissed from the clinic at the provider's discretion.      For prescription refill requests, have your pharmacy contact our office and allow 72 hours for refills to be completed.    Today you received the following chemotherapy and/or immunotherapy agents venofer      To help prevent nausea and vomiting after your treatment, we encourage you to take your nausea medication as directed.  BELOW ARE SYMPTOMS THAT SHOULD BE REPORTED IMMEDIATELY: *FEVER GREATER THAN 100.4 F (38 C) OR HIGHER *CHILLS OR SWEATING *NAUSEA AND VOMITING THAT IS NOT CONTROLLED WITH YOUR NAUSEA MEDICATION *UNUSUAL SHORTNESS OF BREATH *UNUSUAL BRUISING OR BLEEDING *URINARY PROBLEMS (pain or burning when urinating, or frequent urination) *BOWEL PROBLEMS (unusual diarrhea, constipation, pain near the anus) TENDERNESS IN MOUTH AND THROAT WITH OR WITHOUT PRESENCE OF ULCERS (sore throat, sores in mouth, or a toothache) UNUSUAL RASH, SWELLING OR PAIN  UNUSUAL VAGINAL DISCHARGE OR ITCHING   Items with * indicate a potential emergency and should be followed up as soon as possible or go to the Emergency Department if any problems should occur.  Please show the CHEMOTHERAPY ALERT CARD or IMMUNOTHERAPY  ALERT CARD at check-in to the Emergency Department and triage nurse.  Should you have questions after your visit or need to cancel or reschedule your appointment, please contact [facility] - A DEPT OF [facility]  [phone] and follow the prompts.  Office hours are 8:00 a.m. to 4:30 p.m. Monday - Friday. Please note that voicemails left after 4:00 p.m. may not be returned until the following business day.  We are closed weekends and major holidays. You have access to a nurse at all times for urgent questions. Please call the main number to the clinic [phone] and follow the prompts.  For any non-urgent questions, you may also contact your provider using MyChart. We now offer e-Visits for anyone 3 and older to request care online for non-urgent symptoms. For details visit [email or web address].   Also download the MyChart app! Go to the app store, search "MyChart", open the app, select [facility], and log in with your MyChart username and password.

## 2023-03-12 NOTE — Progress Notes (Signed)
 Pt doesn't feel as bad as she did the last time she saw Dr B. Pt does have fatigue. Pt is taking care of grandchildren and her husband. Does have dizziness at times. Dyspnea, mild with exertion.

## 2023-06-12 ENCOUNTER — Inpatient Hospital Stay: Admitting: Internal Medicine

## 2023-06-12 ENCOUNTER — Inpatient Hospital Stay

## 2023-06-12 ENCOUNTER — Encounter: Payer: Self-pay | Admitting: Internal Medicine

## 2023-06-12 ENCOUNTER — Inpatient Hospital Stay: Attending: Internal Medicine

## 2023-06-12 VITALS — BP 126/62 | HR 62 | Temp 96.7°F | Resp 17

## 2023-06-12 DIAGNOSIS — E611 Iron deficiency: Secondary | ICD-10-CM

## 2023-06-12 DIAGNOSIS — D509 Iron deficiency anemia, unspecified: Secondary | ICD-10-CM | POA: Diagnosis present

## 2023-06-12 DIAGNOSIS — N183 Chronic kidney disease, stage 3 unspecified: Secondary | ICD-10-CM | POA: Diagnosis not present

## 2023-06-12 DIAGNOSIS — Z79899 Other long term (current) drug therapy: Secondary | ICD-10-CM | POA: Insufficient documentation

## 2023-06-12 LAB — IRON AND TIBC
Iron: 75 ug/dL (ref 28–170)
Saturation Ratios: 18 % (ref 10.4–31.8)
TIBC: 409 ug/dL (ref 250–450)
UIBC: 334 ug/dL

## 2023-06-12 LAB — BASIC METABOLIC PANEL - CANCER CENTER ONLY
Anion gap: 8 (ref 5–15)
BUN: 15 mg/dL (ref 8–23)
CO2: 22 mmol/L (ref 22–32)
Calcium: 8.3 mg/dL — ABNORMAL LOW (ref 8.9–10.3)
Chloride: 109 mmol/L (ref 98–111)
Creatinine: 0.91 mg/dL (ref 0.44–1.00)
GFR, Estimated: >60 mL/min (ref >60)
Glucose, Bld: 124 mg/dL — ABNORMAL HIGH (ref 70–99)
Potassium: 4 mmol/L (ref 3.5–5.1)
Sodium: 139 mmol/L (ref 135–145)

## 2023-06-12 LAB — VITAMIN B12: Vitamin B-12: 417 pg/mL (ref 180–914)

## 2023-06-12 LAB — CBC WITH DIFFERENTIAL (CANCER CENTER ONLY)
Abs Immature Granulocytes: 0.02 10*3/uL (ref 0.00–0.07)
Basophils Absolute: 0 10*3/uL (ref 0.0–0.1)
Basophils Relative: 1 %
Eosinophils Absolute: 0.1 10*3/uL (ref 0.0–0.5)
Eosinophils Relative: 1 %
HCT: 39.2 % (ref 36.0–46.0)
Hemoglobin: 12.6 g/dL (ref 12.0–15.0)
Immature Granulocytes: 0 %
Lymphocytes Relative: 28 %
Lymphs Abs: 1.6 10*3/uL (ref 0.7–4.0)
MCH: 29.6 pg (ref 26.0–34.0)
MCHC: 32.1 g/dL (ref 30.0–36.0)
MCV: 92.2 fL (ref 80.0–100.0)
Monocytes Absolute: 0.5 10*3/uL (ref 0.1–1.0)
Monocytes Relative: 8 %
Neutro Abs: 3.6 10*3/uL (ref 1.7–7.7)
Neutrophils Relative %: 62 %
Platelet Count: 231 10*3/uL (ref 150–400)
RBC: 4.25 MIL/uL (ref 3.87–5.11)
RDW: 13.2 % (ref 11.5–15.5)
WBC Count: 5.8 10*3/uL (ref 4.0–10.5)
nRBC: 0 % (ref 0.0–0.2)

## 2023-06-12 LAB — FERRITIN: Ferritin: 21 ng/mL (ref 11–307)

## 2023-06-12 MED ORDER — SODIUM CHLORIDE 0.9% FLUSH
10.0000 mL | Freq: Once | INTRAVENOUS | Status: AC | PRN
  Administered 2023-06-12: 10 mL
  Filled 2023-06-12: qty 10

## 2023-06-12 MED ORDER — IRON SUCROSE 20 MG/ML IV SOLN
200.0000 mg | Freq: Once | INTRAVENOUS | Status: AC
  Administered 2023-06-12: 200 mg via INTRAVENOUS

## 2023-06-12 NOTE — Assessment & Plan Note (Addendum)
#  Iron  deficient anemia-hemoglobin ~9/ferritin-7 [FEB 2022; PCP]; patient symptomatic.  Poor tolerance to p.o. iron .  # Today- Hb- 12.6 - proceed with IV iron  infusion today. stable.   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 NO  Capsule study.   # B12 low- pills once a day [PCP].    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   # DISPOSITION: # venofer  today # Follow up in 4  months-MD; labs- cbc/bmp; b12 levels iron  studies;ferritin; -possible venofer - Dr.B

## 2023-06-12 NOTE — Progress Notes (Signed)
 I connected with Christine Ballard on 06/12/23 at  1:30 PM EDT by video enabled telemedicine visit and verified that I am speaking with the correct person using two identifiers.  I discussed the limitations, risks, security and privacy concerns of performing an evaluation and management service by telemedicine and the availability of in-person appointments. I also discussed with the patient that there may be a patient responsible charge related to this service. The patient expressed understanding and agreed to proceed.    Other persons participating in the visit and their role in the encounter: RN/medical reconciliation Patient's location: office Provider's location: home  Oncology History   No history exists.   Chief Complaint: iron  deficiency     History of present illness:Christine Ballard 83 y.o.  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   Patient overall doing well.  Denies any worsening shortness of breath or cough.  Patient not on oral iron  because of gastric distress.  Patient intermittently noticed to have black-colored stool.  Denies any blood in stools.  Observation/objective: Alert & oriented x 3. In No acute distress.   Assessment and plan: Iron  deficiency #Iron  deficient anemia-hemoglobin ~9/ferritin-7 [FEB 2022; PCP]; patient symptomatic.  Poor tolerance to p.o. iron .  # Today- Hb- 12.6 - proceed with IV iron  infusion today. stable.   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 NO  Capsule study.   # B12 low- pills once a day [PCP].    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   # DISPOSITION: # venofer  today # Follow up in 4  months-MD; labs- cbc/bmp; b12 levels iron  studies;ferritin; -possible venofer - Dr.B  Follow-up follow-up instructions:  I discussed the assessment and treatment plan with the patient.  The patient was provided an opportunity to ask questions and  all were answered.  The patient agreed with the plan and demonstrated understanding of instructions.  The patient was advised to call back or seek an in person evaluation if the symptoms worsen or if the condition fails to improve as anticipated.    Dr. Burnie Therien [facility] at [facility] 06/12/2023 1:18 PM

## 2023-10-16 ENCOUNTER — Other Ambulatory Visit

## 2023-10-16 ENCOUNTER — Ambulatory Visit: Admitting: Internal Medicine

## 2023-10-16 ENCOUNTER — Ambulatory Visit

## 2023-10-18 ENCOUNTER — Ambulatory Visit: Admitting: Nurse Practitioner

## 2023-10-18 ENCOUNTER — Ambulatory Visit

## 2023-10-18 ENCOUNTER — Other Ambulatory Visit

## 2023-10-27 IMAGING — MG MM DIGITAL SCREENING BILAT W/ TOMO AND CAD
8 series · 8 of 24 positions shown · non-contrast
Comparison: Previous exam(s).

CLINICAL DATA: Screening.

EXAM:
DIGITAL SCREENING BILATERAL MAMMOGRAM WITH TOMOSYNTHESIS AND CAD
TECHNIQUE: Bilateral screening digital craniocaudal and mediolateral oblique
mammograms were obtained. Bilateral screening digital breast
tomosynthesis was performed. The images were evaluated with
computer-aided detection.

[L CC synth-2D]
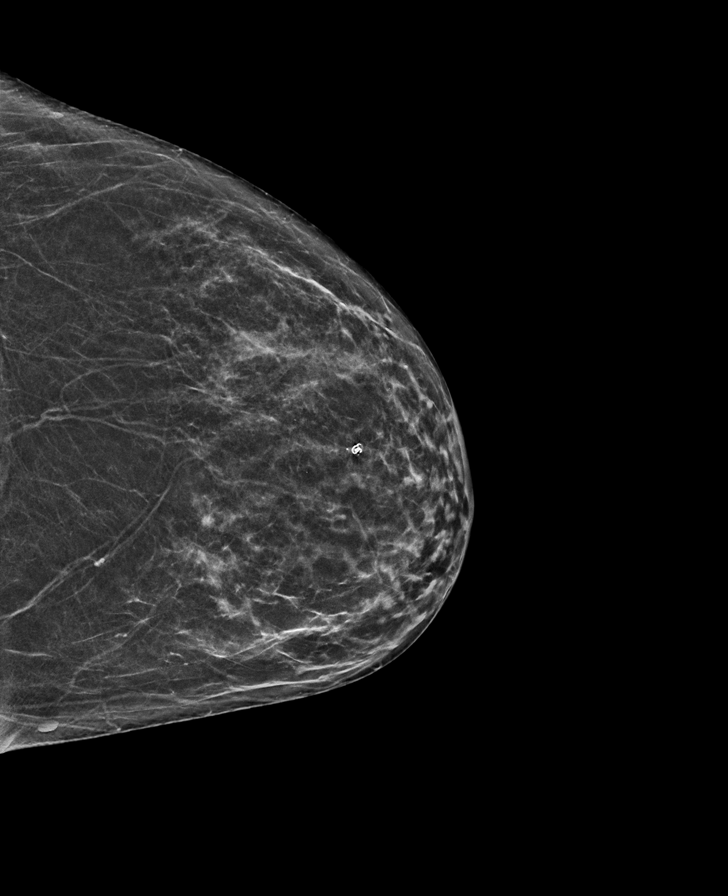

[R MLO synth-2D]
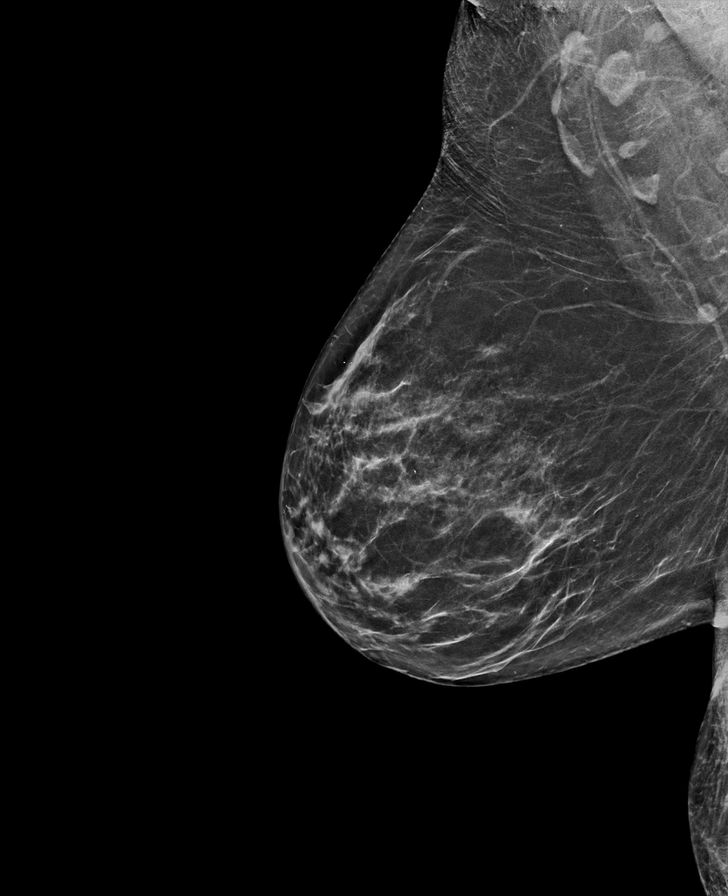

[R CC synth-2D]
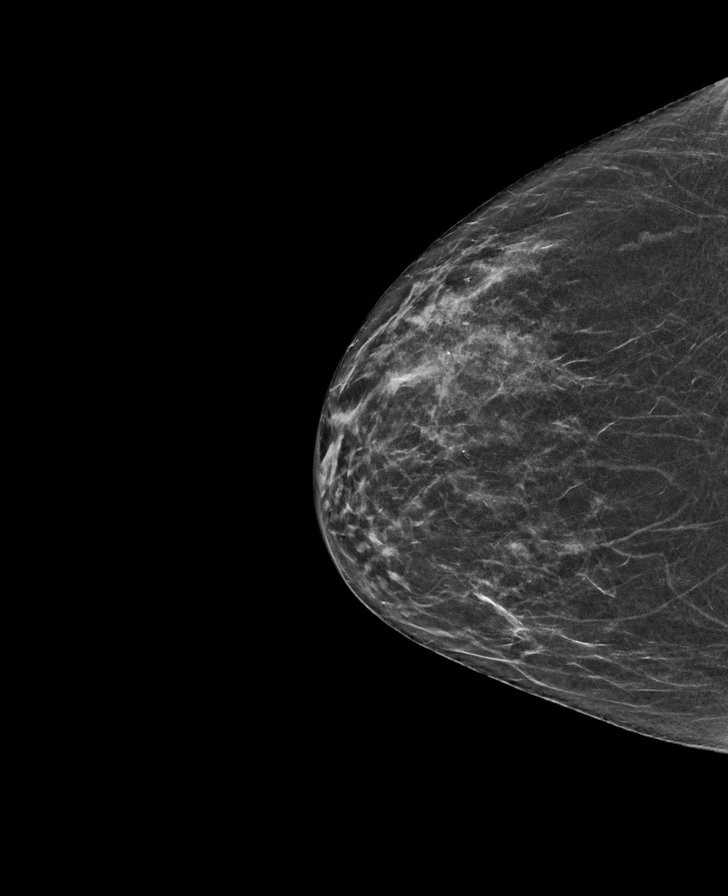

[L MLO synth-2D]
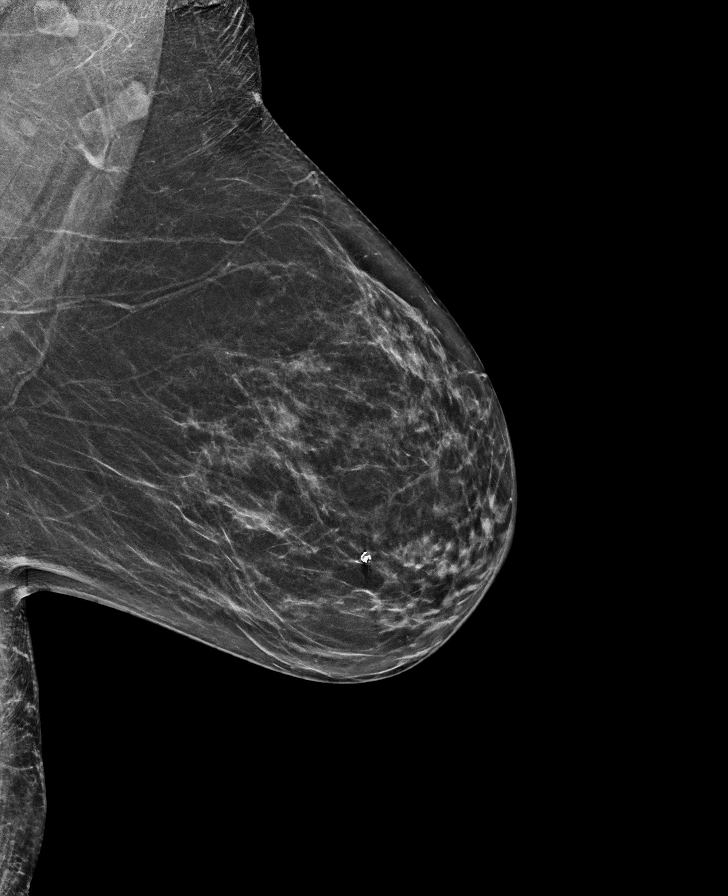

[R CC tomo · tomo slice 29/56.0]
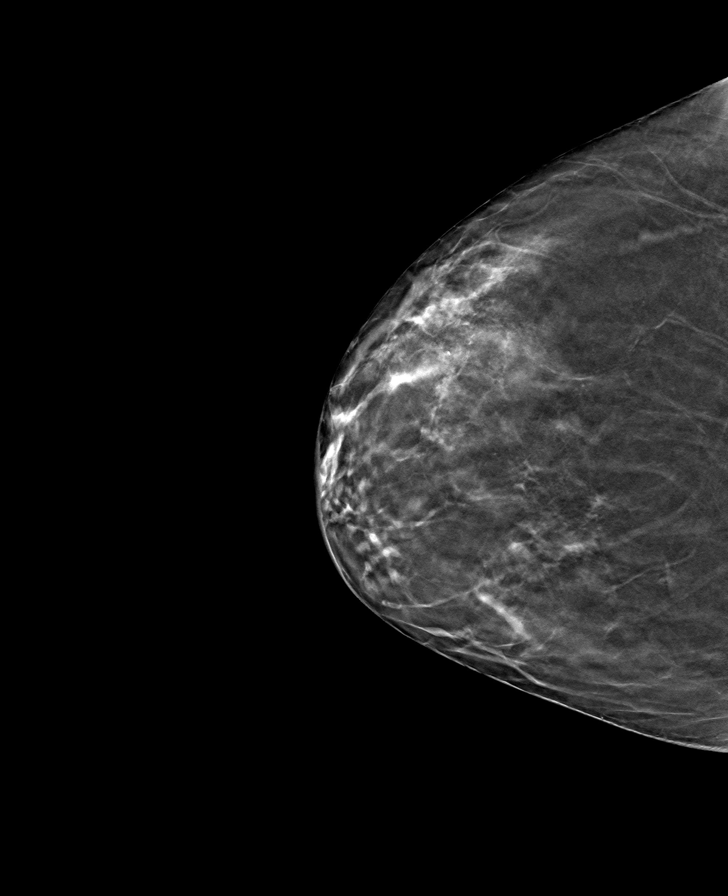

[L MLO tomo · tomo slice 30/59.0]
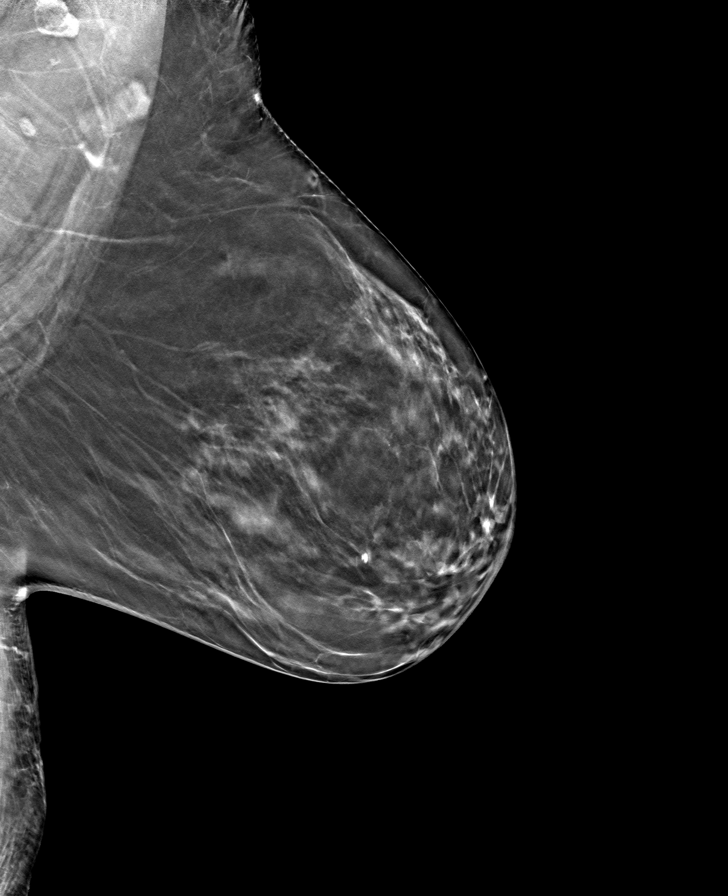

[R MLO tomo · tomo slice 31/61.0]
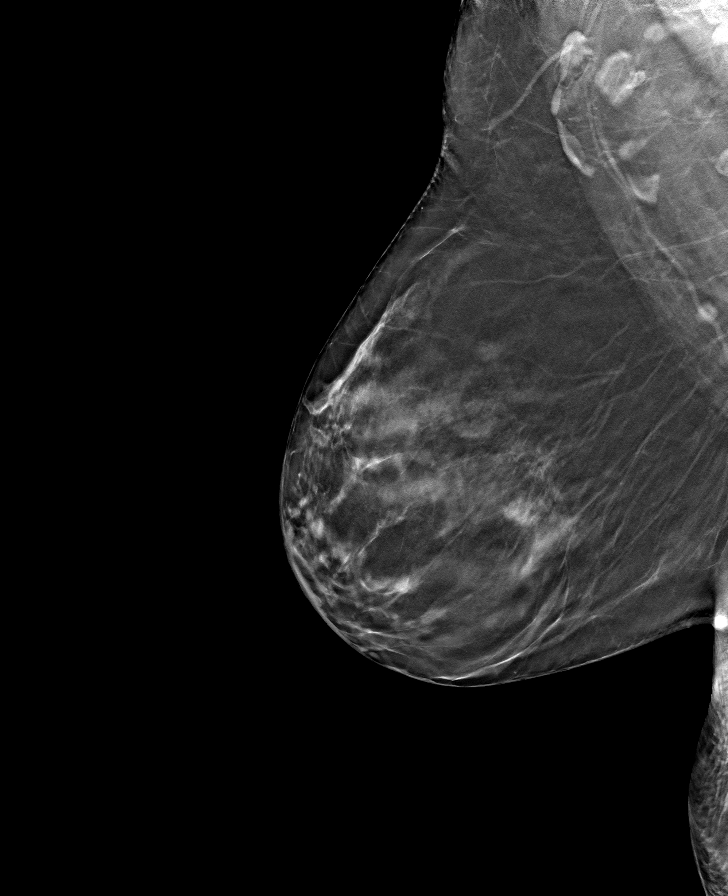

[L CC tomo · tomo slice 27/54.0]
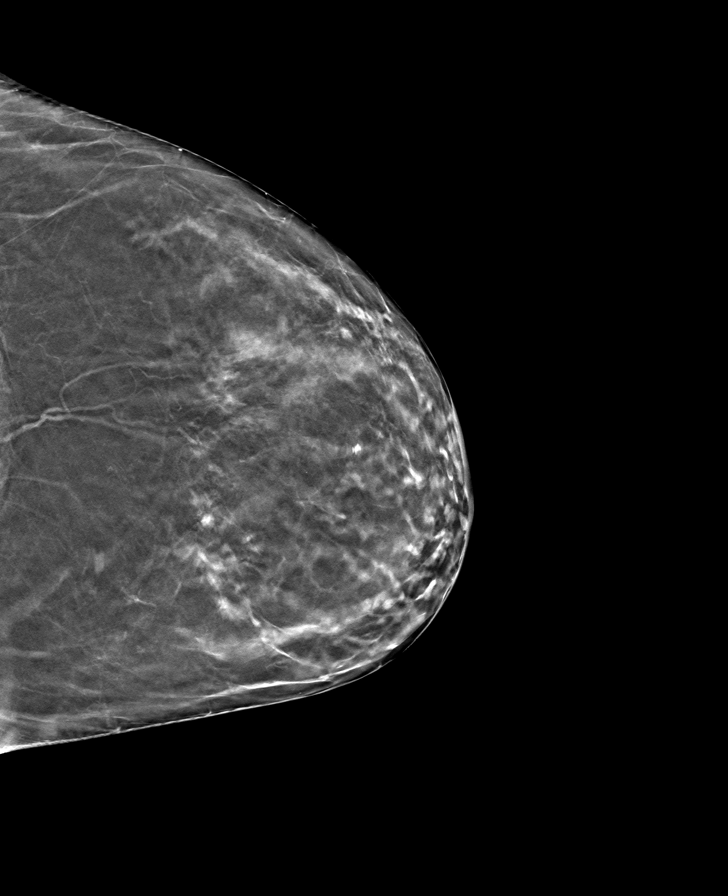

[8 of 24 positions shown; findings below may reference images not displayed]

ACR Breast Density Category b: There are scattered areas of
fibroglandular density.
FINDINGS: In the left breast, a possible asymmetry warrants further
evaluation. In the right breast, no findings suspicious for
malignancy.
IMPRESSION: Further evaluation is suggested for possible asymmetry in the left
breast.

RECOMMENDATION:
Diagnostic mammogram and possibly ultrasound of the left breast.
(Code:[id number])

The patient will be contacted regarding the findings, and additional
imaging will be scheduled.

BI-RADS CATEGORY  0: Incomplete. Need additional imaging evaluation
and/or prior mammograms for comparison.

## 2023-11-01 ENCOUNTER — Encounter: Payer: Self-pay | Admitting: Nurse Practitioner

## 2023-11-01 ENCOUNTER — Inpatient Hospital Stay

## 2023-11-01 ENCOUNTER — Inpatient Hospital Stay: Attending: Internal Medicine

## 2023-11-01 ENCOUNTER — Inpatient Hospital Stay (HOSPITAL_BASED_OUTPATIENT_CLINIC_OR_DEPARTMENT_OTHER): Admitting: Nurse Practitioner

## 2023-11-01 VITALS — BP 120/88 | HR 64 | Temp 97.9°F | Resp 18 | Ht 62.0 in | Wt 139.5 lb

## 2023-11-01 VITALS — BP 144/65 | HR 65 | Temp 97.4°F | Resp 19

## 2023-11-01 DIAGNOSIS — E611 Iron deficiency: Secondary | ICD-10-CM

## 2023-11-01 DIAGNOSIS — D509 Iron deficiency anemia, unspecified: Secondary | ICD-10-CM | POA: Diagnosis present

## 2023-11-01 LAB — CBC WITH DIFFERENTIAL (CANCER CENTER ONLY)
Abs Immature Granulocytes: 0.02 K/uL (ref 0.00–0.07)
Basophils Absolute: 0 K/uL (ref 0.0–0.1)
Basophils Relative: 1 %
Eosinophils Absolute: 0.1 K/uL (ref 0.0–0.5)
Eosinophils Relative: 1 %
HCT: 34.4 % — ABNORMAL LOW (ref 36.0–46.0)
Hemoglobin: 11.1 g/dL — ABNORMAL LOW (ref 12.0–15.0)
Immature Granulocytes: 0 %
Lymphocytes Relative: 34 %
Lymphs Abs: 2.2 K/uL (ref 0.7–4.0)
MCH: 29.4 pg (ref 26.0–34.0)
MCHC: 32.3 g/dL (ref 30.0–36.0)
MCV: 91 fL (ref 80.0–100.0)
Monocytes Absolute: 0.6 K/uL (ref 0.1–1.0)
Monocytes Relative: 10 %
Neutro Abs: 3.4 K/uL (ref 1.7–7.7)
Neutrophils Relative %: 54 %
Platelet Count: 234 K/uL (ref 150–400)
RBC: 3.78 MIL/uL — ABNORMAL LOW (ref 3.87–5.11)
RDW: 13 % (ref 11.5–15.5)
WBC Count: 6.4 K/uL (ref 4.0–10.5)
nRBC: 0 % (ref 0.0–0.2)

## 2023-11-01 LAB — BASIC METABOLIC PANEL - CANCER CENTER ONLY
Anion gap: 9 (ref 5–15)
BUN: 19 mg/dL (ref 8–23)
CO2: 24 mmol/L (ref 22–32)
Calcium: 8.6 mg/dL — ABNORMAL LOW (ref 8.9–10.3)
Chloride: 102 mmol/L (ref 98–111)
Creatinine: 1.16 mg/dL — ABNORMAL HIGH (ref 0.44–1.00)
GFR, Estimated: 47 mL/min — ABNORMAL LOW (ref >60)
Glucose, Bld: 96 mg/dL (ref 70–99)
Potassium: 4.9 mmol/L (ref 3.5–5.1)
Sodium: 135 mmol/L (ref 135–145)

## 2023-11-01 LAB — IRON AND TIBC
Iron: 45 ug/dL (ref 28–170)
Saturation Ratios: 11 % (ref 10.4–31.8)
TIBC: 420 ug/dL (ref 250–450)
UIBC: 375 ug/dL

## 2023-11-01 LAB — FERRITIN: Ferritin: 11 ng/mL (ref 11–307)

## 2023-11-01 LAB — VITAMIN B12: Vitamin B-12: 415 pg/mL (ref 180–914)

## 2023-11-01 MED ORDER — SODIUM CHLORIDE 0.9% FLUSH
10.0000 mL | Freq: Once | INTRAVENOUS | Status: AC | PRN
  Administered 2023-11-01: 10 mL
  Filled 2023-11-01: qty 10

## 2023-11-01 MED ORDER — IRON SUCROSE 20 MG/ML IV SOLN
200.0000 mg | Freq: Once | INTRAVENOUS | Status: AC
  Administered 2023-11-01: 200 mg via INTRAVENOUS
  Filled 2023-11-01: qty 10

## 2023-11-01 NOTE — Progress Notes (Addendum)
 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  Alone.   Christine Ballard 83 y.o. 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  Not on oral iron  due to GI side effects.  She last received IV iron  in June 2025.  She continues to have dizziness at times.  Primarily when eating.  Denies black or bloody stools.  Denies vaginal bleeding.  Denies hematuria denies shortness of breath or chest pain.      Review of Systems  Constitutional:  Positive for malaise/fatigue. Negative for chills, fever and weight loss.  HENT:  Negative for congestion, ear pain and tinnitus.   Eyes:  Negative for blurred vision and double vision.  Respiratory:  Negative for cough, sputum production and shortness of breath.   Cardiovascular:  Negative for chest pain, palpitations and leg swelling.  Gastrointestinal: Negative.  Negative for abdominal pain, blood in stool, constipation, diarrhea, melena, nausea and vomiting.  Genitourinary:  Negative for dysuria, flank pain, frequency and urgency.  Musculoskeletal:  Positive for joint pain. Negative for back pain and falls.  Skin:  Negative for rash.  Neurological:  Positive for dizziness. Negative for weakness and headaches.  Endo/Heme/Allergies: Negative.  Does not bruise/bleed easily.  Psychiatric/Behavioral: Negative.  Negative for depression. The patient is not nervous/anxious and does not have insomnia.      MEDICAL HISTORY:  Past Medical History:  Diagnosis Date   Allergic rhinitis    Allergy-induced asthma    seasonal allergies   Anxiety    Arthritis    chronic hip pain,right   Cancer (HCC)    skin on back   Cataracts, bilateral    small   Chronic kidney disease    mild renal insufficiency   Constipation    COPD (chronic obstructive pulmonary disease) (HCC)    Depression    Dyspnea    GERD (gastroesophageal reflux disease)    Headache    occular   History of bronchitis    about twice a year   History of hematuria    Hypertension    Hypoglycemia    distant past   Osteopenia    Osteopenia    Torn meniscus    bilateral   Vertigo    2-3x/yr   Wears glasses     SURGICAL HISTORY: Past Surgical History:  Procedure Laterality Date   ABDOMINAL HYSTERECTOMY     ANTERIOR CERVICAL DECOMP/DISCECTOMY FUSION Christine Ballard/A 06/26/2016   Procedure: Cervical Two-Three, Cervical Three-Four, Cervical Four-Five, Cervical Five-Six Anterior Discectomy with Fusion and Plate Fixation;  Surgeon: Ditty, Morene Hicks, MD;  Location: [facility] OR;  Service: Neurosurgery;  Laterality: Christine Ballard/A;  C3-6 Anterior discectomy with fusion and plate fixation   BASAL CELL CARCINOMA EXCISION     on back   BLEPHAROPLASTY     CARPAL TUNNEL RELEASE Bilateral    CATARACT EXTRACTION W/PHACO Right 02/03/2019   Procedure: CATARACT EXTRACTION PHACO AND INTRAOCULAR LENS PLACEMENT (IOC) RIGHT 5.39 00:38.1;  Surgeon: Jaye Fallow, MD;  Location: [facility];  Service: Ophthalmology;  Laterality: Right;   CATARACT EXTRACTION  W/PHACO Left 02/24/2019   Procedure: CATARACT EXTRACTION PHACO AND INTRAOCULAR LENS PLACEMENT (IOC)  LEFT;  Surgeon: Jaye Fallow, MD;  Location: [facility];  Service: Ophthalmology;  Laterality: Left;  CDE 4.86 U/S 0:26.1   COLONOSCOPY     COLONOSCOPY WITH PROPOFOL  Christine Ballard/A 10/16/2016   Procedure: COLONOSCOPY WITH PROPOFOL ;  Surgeon: Gaylyn Gladis PENNER, MD;  Location: [facility];   Service: Endoscopy;  Laterality: Christine Ballard/A;   COLONOSCOPY WITH PROPOFOL  Christine Ballard/A 05/27/2020   Procedure: COLONOSCOPY WITH PROPOFOL ;  Surgeon: Maryruth Ole DASEN, MD;  Location: [facility];  Service: Endoscopy;  Laterality: Christine Ballard/A;   COLONOSCOPY WITH PROPOFOL  Christine Ballard/A 01/21/2023   Procedure: COLONOSCOPY WITH PROPOFOL ;  Surgeon: Maryruth Ole DASEN, MD;  Location: [facility];  Service: Endoscopy;  Laterality: Christine Ballard/A;   ESOPHAGOGASTRODUODENOSCOPY (EGD) WITH PROPOFOL  Christine Ballard/A 05/27/2020   Procedure: ESOPHAGOGASTRODUODENOSCOPY (EGD) WITH PROPOFOL ;  Surgeon: Maryruth Ole DASEN, MD;  Location: [facility];  Service: Endoscopy;  Laterality: Christine Ballard/A;   ESOPHAGOGASTRODUODENOSCOPY (EGD) WITH PROPOFOL  Christine Ballard/A 01/21/2023   Procedure: ESOPHAGOGASTRODUODENOSCOPY (EGD) WITH PROPOFOL ;  Surgeon: Maryruth Ole DASEN, MD;  Location: [facility];  Service: Endoscopy;  Laterality: Christine Ballard/A;   EYE SURGERY     iron  infusions     NASAL SINUS SURGERY     ORIF WRIST FRACTURE Left 12/02/2019   Procedure: OPEN REDUCTION INTERNAL FIXATION (ORIF) WRIST FRACTURE;  Surgeon: Edie Norleen PARAS, MD;  Location: [facility];  Service: Orthopedics;  Laterality: Left;   PARTIAL KNEE ARTHROPLASTY Right 05/26/2019   Procedure: UNICOMPARTMENTAL KNEE;  Surgeon: Edie Norleen PARAS, MD;  Location: [facility];  Service: Orthopedics;  Laterality: Right;   REPAIR EXTENSOR TENDON Left 07/10/2018   Procedure: REPAIR EXTENSOR TENDON LEFT LONG FINGER REALIGNMENT;  Surgeon: Kathlynn Sharper, MD;  Location: [facility];  Service: Orthopedics;  Laterality: Left;   TONSILLECTOMY      SOCIAL HISTORY: Social History   Socioeconomic History   Marital status: Married    Spouse name: Christine Ballard   Number of children: Not on file   Years of education: Not on file   Highest education level: Not on file  Occupational History   Not on file  Tobacco Use   Smoking status: Never   Smokeless tobacco: Never  Vaping Use   Vaping status: Never Used  Substance and Sexual Activity   Alcohol  use: No    Drug use: No   Sexual activity: Not on file  Other Topics Concern   Not on file  Social History Narrative   Lives in [place] with husband. Daughter in [place]. No alcohol . Never smoked. Worked at [employer].    Social Drivers of Corporate Investment Banker Strain: Low Risk  (09/04/2023)   Received from [facility]   Overall Financial Resource Strain (CARDIA)    Difficulty of Paying Living Expenses: Not hard at all  Food Insecurity: No Food Insecurity (09/04/2023)   Received from [facility]   Hunger Vital Sign    Within the past 12 months, you worried that your food would run out before you got the money to buy more.: Never true    Within the past 12 months, the food you bought just didn't last and you didn't have money to get more.: Never true  Transportation Needs: No Transportation Needs (09/04/2023)   Received from [facility] - Transportation    In the past 12 months, has lack of transportation kept you from medical appointments or from getting medications?: No  Lack of Transportation (Non-Medical): No  Physical Activity: Not on file  Stress: Not on file  Social Connections: Not on file  Intimate Partner Violence: Not on file    FAMILY HISTORY: Family History  Problem Relation Age of Onset   Breast cancer Maternal Aunt    Breast cancer Cousin     ALLERGIES:  is allergic to bisacodyl , ciprofloxacin, procaine, penicillins, and sulfa antibiotics.  MEDICATIONS:  Current Outpatient Medications  Medication Sig Dispense Refill   acetaminophen  (TYLENOL ) 650 MG CR tablet Take 650 mg by mouth See admin instructions. Take 1 tablet (650 mg) by mouth scheduled twice daily, may take an additional dose at mid afternoon if needed for pain.     albuterol  (VENTOLIN  HFA) 108 (90 Base) MCG/ACT inhaler 2 puffs every 4 (four) hours as needed.     amLODipine  (NORVASC ) 2.5 MG tablet Take 2.5  mg by mouth daily.     Aromatic Inhalants (VICKS BABYRUB) OINT Apply 1 application topically.      busPIRone (BUSPAR) 5 MG tablet Take 5 mg by mouth 2 (two) times daily.     cetirizine (ZYRTEC) 10 MG tablet Take 10 mg by mouth daily as needed for allergies.     Cyanocobalamin  (VITAMIN B 12) 100 MCG LOZG Take by mouth.     docusate sodium  (COLACE) 100 MG capsule Take 100-200 mg by mouth daily as needed for mild constipation.      DULoxetine  (CYMBALTA ) 60 MG capsule Take 60 mg by mouth daily.      fluticasone  (FLONASE ) 50 MCG/ACT nasal spray Place 1 spray into the nose daily as needed for allergies.      Menthol -Methyl Salicylate (MUSCLE RUB) 10-15 % CREA Apply 1 application topically 3 (three) times daily as needed (knee pain/arthritis.).      montelukast (SINGULAIR) 10 MG tablet Take 10 mg by mouth at bedtime.     omeprazole (PRILOSEC) 40 MG capsule Take 40 mg by mouth every evening.      polyethylene glycol (MIRALAX / GLYCOLAX) 17 g packet Take 17 g by mouth daily.     VITAMIN D  PO Take 1,000 Int'l Units/day by mouth daily.     meclizine  (ANTIVERT ) 12.5 MG tablet Take 12.5 mg by mouth 3 (three) times daily as needed for dizziness (vertigo).      No current facility-administered medications for this visit.    PHYSICAL EXAMINATION: Vitals:   11/01/23 1505  BP: 120/88  Pulse: 64  Resp: 18  Temp: 97.9 F (36.6 C)  SpO2: 98%   Filed Weights   11/01/23 1505  Weight: 139 lb 8 oz (63.3 kg)   Physical Exam Vitals reviewed.  Constitutional:      Appearance: She is not ill-appearing.  Eyes:     General: No scleral icterus.    Conjunctiva/sclera: Conjunctivae normal.  Cardiovascular:     Rate and Rhythm: Normal rate and regular rhythm.  Abdominal:     General: There is no distension.     Palpations: Abdomen is soft.     Tenderness: There is no abdominal tenderness. There is no guarding.  Musculoskeletal:        General: No deformity.     Right lower leg: No edema.     Left lower  leg: No edema.  Skin:    General: Skin is warm and dry.  Neurological:     Mental Status: She is alert and oriented to person, place, and time.  Psychiatric:        Mood and  Affect: Mood normal.        Behavior: Behavior normal.    LABORATORY DATA:  I have reviewed the data as listed Lab Results  Component Value Date   WBC 6.4 11/01/2023   HGB 11.1 (L) 11/01/2023   HCT 34.4 (L) 11/01/2023   MCV 91.0 11/01/2023   PLT 234 11/01/2023   Recent Labs    03/12/23 1252 06/12/23 1248 11/01/23 1438  NA 138 139 135  K 4.6 4.0 4.9  CL 106 109 102  CO2 26 22 24   GLUCOSE 106* 124* 96  BUN 18 15 19   CREATININE 1.15* 0.91 1.16*  CALCIUM 9.4 8.3* 8.6*  GFRNONAA 48* >60 47*   Iron /TIBC/Ferritin/ %Sat    Component Value Date/Time   IRON  75 06/12/2023 1247   TIBC 409 06/12/2023 1247   FERRITIN 21 06/12/2023 1247   IRONPCTSAT 18 06/12/2023 1247   No results found.  Assessment & Plan:   # Iron  deficient anemia-hemoglobin ~9/ferritin-7 [FEB 2022; PCP]; patient symptomatic.  Poor tolerance to p.o. iron .She received IV iron /venofer  x 3, last 8.9.24. Tolerated well.   # Today, hemoglobin 11.1.  Ferritin and iron  studies are pending at time of visit.  Symptomatic.  Proceeding with Venofer  today.  Plan for additional doses based on her iron  levels.  # Etiology of iron  deficiency anemia -unclear. EGD/colonosopy-  May, 2022 [Dr.Locklear]- No evidence of any GI blood loss. June 2024- Hemoccult- positive. Previously discussed capsule study however per GI/insurance, need to repeat colonoscopy and egd first.  She underwent colonoscopy and EGD in January 2025.  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   # B12 low- pills once a day [PCP].    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    # DISPOSITION: # venofer  today 3 mo- labs (cbc, bmp, ferritin, iron  studies, b12), Dr Rennie, +/- venofer - la  No problem-specific Assessment & Plan notes found for this  encounter.  Christine KANDICE Dawn, NP 11/01/2023

## 2023-11-01 NOTE — Progress Notes (Signed)
 Patient states that just about every time she eats she starts to feel dizzy. Meclizine  she is out of the medication, it helps her. She has trouble with swallowing.

## 2023-11-01 NOTE — Addendum Note (Signed)
 Addended by: LAEL BROWNING A on: 11/01/2023 03:53 PM   Modules accepted: Orders

## 2024-01-31 ENCOUNTER — Inpatient Hospital Stay: Attending: Internal Medicine

## 2024-01-31 ENCOUNTER — Inpatient Hospital Stay: Admitting: Internal Medicine

## 2024-01-31 ENCOUNTER — Inpatient Hospital Stay

## 2024-01-31 ENCOUNTER — Encounter: Payer: Self-pay | Admitting: Internal Medicine

## 2024-01-31 VITALS — BP 157/70 | HR 63

## 2024-01-31 DIAGNOSIS — E611 Iron deficiency: Secondary | ICD-10-CM

## 2024-01-31 LAB — IRON AND TIBC
Iron: 46 ug/dL (ref 28–170)
Saturation Ratios: 11 % (ref 10.4–31.8)
TIBC: 433 ug/dL (ref 250–450)
UIBC: 387 ug/dL

## 2024-01-31 LAB — CBC WITH DIFFERENTIAL (CANCER CENTER ONLY)
Abs Immature Granulocytes: 0.02 10*3/uL (ref 0.00–0.07)
Basophils Absolute: 0 10*3/uL (ref 0.0–0.1)
Basophils Relative: 1 %
Eosinophils Absolute: 0.1 10*3/uL (ref 0.0–0.5)
Eosinophils Relative: 1 %
HCT: 34.1 % — ABNORMAL LOW (ref 36.0–46.0)
Hemoglobin: 10.9 g/dL — ABNORMAL LOW (ref 12.0–15.0)
Immature Granulocytes: 0 %
Lymphocytes Relative: 31 %
Lymphs Abs: 1.9 10*3/uL (ref 0.7–4.0)
MCH: 28.8 pg (ref 26.0–34.0)
MCHC: 32 g/dL (ref 30.0–36.0)
MCV: 90 fL (ref 80.0–100.0)
Monocytes Absolute: 0.4 10*3/uL (ref 0.1–1.0)
Monocytes Relative: 7 %
Neutro Abs: 3.8 10*3/uL (ref 1.7–7.7)
Neutrophils Relative %: 60 %
Platelet Count: 214 10*3/uL (ref 150–400)
RBC: 3.79 MIL/uL — ABNORMAL LOW (ref 3.87–5.11)
RDW: 13.5 % (ref 11.5–15.5)
WBC Count: 6.2 10*3/uL (ref 4.0–10.5)
nRBC: 0 % (ref 0.0–0.2)

## 2024-01-31 LAB — BASIC METABOLIC PANEL - CANCER CENTER ONLY
Anion gap: 12 (ref 5–15)
BUN: 16 mg/dL (ref 8–23)
CO2: 24 mmol/L (ref 22–32)
Calcium: 9.2 mg/dL (ref 8.9–10.3)
Chloride: 103 mmol/L (ref 98–111)
Creatinine: 0.9 mg/dL (ref 0.44–1.00)
GFR, Estimated: >60 mL/min
Glucose, Bld: 131 mg/dL — ABNORMAL HIGH (ref 70–99)
Potassium: 4.3 mmol/L (ref 3.5–5.1)
Sodium: 138 mmol/L (ref 135–145)

## 2024-01-31 LAB — VITAMIN B12: Vitamin B-12: 687 pg/mL (ref 180–914)

## 2024-01-31 LAB — FERRITIN: Ferritin: 23 ng/mL (ref 11–307)

## 2024-01-31 MED ORDER — IRON SUCROSE 20 MG/ML IV SOLN
200.0000 mg | Freq: Once | INTRAVENOUS | Status: AC
  Administered 2024-01-31: 200 mg via INTRAVENOUS
  Filled 2024-01-31: qty 10

## 2024-01-31 NOTE — Progress Notes (Signed)
 Declined post-observation. Aware of risks. Vitals stable at discharge.

## 2024-01-31 NOTE — Progress Notes (Signed)
 [facility] CONSULT NOTE  Patient Care Team: Epifanio Alm SQUIBB, MD as PCP - General (Internal Medicine) Rennie Cindy SAUNDERS, MD as Consulting Physician (Oncology) Aundria Ladell POUR, MD as Consulting Physician (Gastroenterology)  CHIEF COMPLAINTS/PURPOSE OF CONSULTATION: ANEMIA   HEMATOLOGY HISTORY:  # MICROCYTIC ANEMIA [FEB 2022- Hb 9.6; Ferritin-6] > 25 years Hx of hematuria [[place]; urology]-cystoscopy;  EGD-/ colonoscopy-2022 polyps ; capsule-Bone marrow Biopsy-NONE; JAN 2025- EGD- [facility]  HISTORY OF PRESENTING ILLNESS: Ambulating independently.  Alone.   Christine Ballard 84 y.o.  female iron  deficient anemia of from chronic GI bleed/[facility] is here for follow-up.  Discussed the use of AI scribe software for clinical note transcription with the patient, who gave verbal consent to proceed.  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  Symptoms include ongoing melena, described as dark, stringy, and occasionally black stools, present for approximately one year. The patient reports frequent leakage of black stool with defecation. She denies new or worsening abdominal pain but notes occasional right-sided discomfort, which she attributes to a prior muscular bulge related to scoliosis.  She reports increased fatigue, frequent dizziness, and episodes of staggering, with a pronounced need to nap throughout the day. She does not tolerate oral iron  supplementation and has been receiving intravenous iron  infusions, with the most recent administered in October 2025. She feels her symptoms persist and requests additional infusions due to ongoing low energy. Her most recent hemoglobin was 10.9 g/dL.  She has undergone prior endoscopic evaluation, with the most recent upper endoscopy performed in January 2025, after  which no further endoscopy was recommended. She is currently taking omeprazole for gastric protection.       Review of Systems  Constitutional:  Positive for malaise/fatigue. Negative for chills, fever and weight loss.  HENT:  Negative for congestion, ear pain and tinnitus.   Eyes: Negative.  Negative for blurred vision and double vision.  Respiratory:  Positive for shortness of breath. Negative for cough and sputum production.   Cardiovascular: Negative.  Negative for chest pain, palpitations and leg swelling.  Gastrointestinal: Negative.  Negative for abdominal pain, constipation, diarrhea, nausea and vomiting.  Genitourinary:  Negative for dysuria, frequency and urgency.  Musculoskeletal:  Negative for back pain and falls.  Skin: Negative.  Negative for rash.  Neurological: Negative.  Negative for weakness and headaches.  Endo/Heme/Allergies: Negative.  Does not bruise/bleed easily.  Psychiatric/Behavioral: Negative.  Negative for depression. The patient is not nervous/anxious and does not have insomnia.     MEDICAL HISTORY:  Past Medical History:  Diagnosis Date   Allergic rhinitis    Allergy-induced asthma    seasonal allergies   Anxiety    Arthritis    chronic hip pain,right   Cancer (HCC)    skin on back   Cataracts, bilateral    small   Chronic kidney disease    mild renal insufficiency   Constipation    COPD (chronic obstructive pulmonary disease) (HCC)    Depression    Dyspnea    GERD (gastroesophageal reflux disease)    Headache    occular   History of bronchitis    about twice a year   History of hematuria    Hypertension    Hypoglycemia    distant past   Osteopenia    Osteopenia    Torn meniscus    bilateral  Vertigo    2-3x/yr   Wears glasses     SURGICAL HISTORY: Past Surgical History:  Procedure Laterality Date   ABDOMINAL HYSTERECTOMY     ANTERIOR CERVICAL DECOMP/DISCECTOMY FUSION N/A 06/26/2016   Procedure: Cervical Two-Three,  Cervical Three-Four, Cervical Four-Five, Cervical Five-Six Anterior Discectomy with Fusion and Plate Fixation;  Surgeon: Ditty, Morene Hicks, MD;  Location: [facility] OR;  Service: Neurosurgery;  Laterality: N/A;  C3-6 Anterior discectomy with fusion and plate fixation   BASAL CELL CARCINOMA EXCISION     on back   BLEPHAROPLASTY     CARPAL TUNNEL RELEASE Bilateral    CATARACT EXTRACTION W/PHACO Right 02/03/2019   Procedure: CATARACT EXTRACTION PHACO AND INTRAOCULAR LENS PLACEMENT (IOC) RIGHT 5.39 00:38.1;  Surgeon: Jaye Fallow, MD;  Location: [facility];  Service: Ophthalmology;  Laterality: Right;   CATARACT EXTRACTION W/PHACO Left 02/24/2019   Procedure: CATARACT EXTRACTION PHACO AND INTRAOCULAR LENS PLACEMENT (IOC)  LEFT;  Surgeon: Jaye Fallow, MD;  Location: [facility];  Service: Ophthalmology;  Laterality: Left;  CDE 4.86 U/S 0:26.1   COLONOSCOPY     COLONOSCOPY WITH PROPOFOL  N/A 10/16/2016   Procedure: COLONOSCOPY WITH PROPOFOL ;  Surgeon: Gaylyn Gladis PENNER, MD;  Location: [facility] ENDOSCOPY;  Service: Endoscopy;  Laterality: N/A;   COLONOSCOPY WITH PROPOFOL  N/A 05/27/2020   Procedure: COLONOSCOPY WITH PROPOFOL ;  Surgeon: Maryruth Ole DASEN, MD;  Location: [facility] ENDOSCOPY;  Service: Endoscopy;  Laterality: N/A;   COLONOSCOPY WITH PROPOFOL  N/A 01/21/2023   Procedure: COLONOSCOPY WITH PROPOFOL ;  Surgeon: Maryruth Ole DASEN, MD;  Location: [facility] ENDOSCOPY;  Service: Endoscopy;  Laterality: N/A;   ESOPHAGOGASTRODUODENOSCOPY (EGD) WITH PROPOFOL  N/A 05/27/2020   Procedure: ESOPHAGOGASTRODUODENOSCOPY (EGD) WITH PROPOFOL ;  Surgeon: Maryruth Ole DASEN, MD;  Location: [facility] ENDOSCOPY;  Service: Endoscopy;  Laterality: N/A;   ESOPHAGOGASTRODUODENOSCOPY (EGD) WITH PROPOFOL  N/A 01/21/2023   Procedure: ESOPHAGOGASTRODUODENOSCOPY (EGD) WITH PROPOFOL ;  Surgeon: Maryruth Ole DASEN, MD;  Location: [facility] ENDOSCOPY;  Service: Endoscopy;  Laterality: N/A;   EYE SURGERY     iron  infusions      NASAL SINUS SURGERY     ORIF WRIST FRACTURE Left 12/02/2019   Procedure: OPEN REDUCTION INTERNAL FIXATION (ORIF) WRIST FRACTURE;  Surgeon: Edie Norleen PARAS, MD;  Location: [facility] ORS;  Service: Orthopedics;  Laterality: Left;   PARTIAL KNEE ARTHROPLASTY Right 05/26/2019   Procedure: UNICOMPARTMENTAL KNEE;  Surgeon: Edie Norleen PARAS, MD;  Location: [facility] ORS;  Service: Orthopedics;  Laterality: Right;   REPAIR EXTENSOR TENDON Left 07/10/2018   Procedure: REPAIR EXTENSOR TENDON LEFT LONG FINGER REALIGNMENT;  Surgeon: Kathlynn Sharper, MD;  Location: [facility] ORS;  Service: Orthopedics;  Laterality: Left;   TONSILLECTOMY      SOCIAL HISTORY: Social History   Socioeconomic History   Marital status: Married    Spouse name: kenneth   Number of children: Not on file   Years of education: Not on file   Highest education level: Not on file  Occupational History   Not on file  Tobacco Use   Smoking status: Never   Smokeless tobacco: Never  Vaping Use   Vaping status: Never Used  Substance and Sexual Activity   Alcohol  use: No   Drug use: No   Sexual activity: Not on file  Other Topics Concern   Not on file  Social History Narrative   Lives in [place] with husband. Daughter in [place]. No alcohol . Never smoked. Worked at [employer].    Social Drivers of Health   Tobacco Use: Low Risk (  01/31/2024)   Patient History    Smoking Tobacco Use: Never    Smokeless Tobacco Use: Never    Passive Exposure: Not on file  Financial Resource Strain: Low Risk  (09/04/2023)   Received from [facility]   Overall Financial Resource Strain (CARDIA)    Difficulty of Paying Living Expenses: Not hard at all  Food Insecurity: No Food Insecurity (09/04/2023)   Received from [facility]   Epic    Within the past 12 months, you worried that your food would run out before you got the money to buy more.: Never true    Within the past 12 months, the  food you bought just didn't last and you didn't have money to get more.: Never true  Transportation Needs: No Transportation Needs (09/04/2023)   Received from [facility] - Transportation    In the past 12 months, has lack of transportation kept you from medical appointments or from getting medications?: No    Lack of Transportation (Non-Medical): No  Physical Activity: Not on file  Stress: Not on file  Social Connections: Not on file  Intimate Partner Violence: Not on file  Depression (PHQ2-9): Low Risk (01/31/2024)   Depression (PHQ2-9)    PHQ-2 Score: 0  Alcohol  Screen: Not on file  Housing: Low Risk  (09/04/2023)   Received from [facility]   Epic    In the last 12 months, was there a time when you were not able to pay the mortgage or rent on time?: No    In the past 12 months, how many times have you moved where you were living?: 0    At any time in the past 12 months, were you homeless or living in a shelter (including now)?: No  Utilities: Not At Risk (09/04/2023)   Received from [facility]   Epic    In the past 12 months has the electric, gas, oil, or water company threatened to shut off services in your home?: No  Health Literacy: Not on file    FAMILY HISTORY: Family History  Problem Relation Age of Onset   Breast cancer Maternal Aunt    Breast cancer Cousin     ALLERGIES:  is allergic to bisacodyl , ciprofloxacin, procaine, penicillins, and sulfa antibiotics.  MEDICATIONS:  Current Outpatient Medications  Medication Sig Dispense Refill   acetaminophen  (TYLENOL ) 650 MG CR tablet Take 650 mg by mouth See admin instructions. Take 1 tablet (650 mg) by mouth scheduled twice daily, may take an additional dose at mid afternoon if needed for pain.     albuterol  (VENTOLIN  HFA) 108 (90 Base) MCG/ACT inhaler 2 puffs every 4 (four) hours as needed.     Aromatic Inhalants (VICKS BABYRUB) OINT Apply 1 application  topically.      busPIRone (BUSPAR) 5 MG tablet Take 5 mg by mouth 2 (two) times daily.     Cyanocobalamin  (VITAMIN B 12) 100 MCG LOZG Take by mouth.     docusate sodium  (COLACE) 100 MG capsule Take 100-200 mg by mouth daily as needed for mild constipation.      DULoxetine  (CYMBALTA ) 60 MG capsule Take 60 mg by mouth daily.      fluticasone  (FLONASE ) 50 MCG/ACT nasal spray Place 1 spray into the nose daily as needed for allergies.      Menthol -Methyl Salicylate (MUSCLE RUB) 10-15 % CREA Apply 1 application topically 3 (three) times daily as needed (knee pain/arthritis.).  methocarbamol  (ROBAXIN ) 500 MG tablet Take 500 mg by mouth 3 (three) times daily.     montelukast (SINGULAIR) 10 MG tablet Take 10 mg by mouth at bedtime.     omeprazole (PRILOSEC) 40 MG capsule Take 40 mg by mouth every evening.      polyethylene glycol (MIRALAX / GLYCOLAX) 17 g packet Take 17 g by mouth daily as needed.     VITAMIN D  PO Take 1,000 Int'l Units/day by mouth daily.     HYDROcodone -acetaminophen  (NORCO/VICODIN) 5-325 MG tablet Take 1 tablet by mouth every 6 (six) hours as needed. for pain     No current facility-administered medications for this visit.      PHYSICAL EXAMINATION:   Vitals:   01/31/24 1440 01/31/24 1515  BP: (!) 154/74 (!) 152/69  Pulse: 70   Resp: 16   Temp: 97.7 F (36.5 C)   SpO2: 100%     Filed Weights   01/31/24 1440  Weight: 138 lb 9.6 oz (62.9 kg)     Physical Exam Constitutional:      Appearance: Normal appearance.  HENT:     Head: Normocephalic and atraumatic.  Eyes:     Pupils: Pupils are equal, round, and reactive to light.  Cardiovascular:     Rate and Rhythm: Normal rate and regular rhythm.     Heart sounds: Normal heart sounds. No murmur heard. Pulmonary:     Effort: Pulmonary effort is normal.     Breath sounds: Normal breath sounds. No wheezing.  Abdominal:     General: Bowel sounds are normal. There is no distension.     Palpations: Abdomen is  soft.     Tenderness: There is no abdominal tenderness.  Musculoskeletal:        General: Normal range of motion.     Cervical back: Normal range of motion.  Skin:    General: Skin is warm and dry.     Findings: No rash.  Neurological:     Mental Status: She is alert and oriented to person, place, and time.     Gait: Gait is intact.  Psychiatric:        Mood and Affect: Mood and affect normal.        Cognition and Memory: Memory normal.        Judgment: Judgment normal.     LABORATORY DATA:  I have reviewed the data as listed Lab Results  Component Value Date   WBC 6.2 01/31/2024   HGB 10.9 (L) 01/31/2024   HCT 34.1 (L) 01/31/2024   MCV 90.0 01/31/2024   PLT 214 01/31/2024   Recent Labs    06/12/23 1248 11/01/23 1438 01/31/24 1445  NA 139 135 138  K 4.0 4.9 4.3  CL 109 102 103  CO2 22 24 24   GLUCOSE 124* 96 131*  BUN 15 19 16   CREATININE 0.91 1.16* 0.90  CALCIUM 8.3* 8.6* 9.2  GFRNONAA >60 47* >60      No results found.  Iron  deficiency #Iron  deficient anemia-hemoglobin ~9/ferritin-7 [FEB 2022; PCP]; patient symptomatic.  Poor tolerance to p.o. iron ; Today- Hb- 10.9 - proceed with IV iron  infusion today-recommend 3 more infusions.  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 NO  Capsule study.  Given the black-colored stool-if worsening or seeing any bright red blood recommend follow-up with PCP/GI.  # B12 low- pills once a day [PCP].    # DISPOSITION: # venofer  today #  venofer  weekly x 3 more # Follow up in 3  months-MD; labs- cbc/bmp; b12 levels iron  studies;ferritin; -possible venofer - Dr.B        Cindy JONELLE Joe, MD 01/31/2024 3:32 PM

## 2024-01-31 NOTE — Progress Notes (Signed)
 Fatigue/weakness: YES Dyspena: NO Light headedness: YES Blood in stool: YES FOR THE PAST WEEK-DARK  Pt states she has been itchy all over for the last year and a half.

## 2024-01-31 NOTE — Patient Instructions (Signed)
 Iron  Sucrose Injection What is this medication? IRON  SUCROSE (EYE ern SOO krose) treats low levels of iron  (iron  deficiency anemia) in people with kidney disease. Iron  is a mineral that plays an important role in making red blood cells, which carry oxygen from your lungs to the rest of your body. This medicine may be used for other purposes; ask your health care provider or pharmacist if you have questions. COMMON BRAND NAME(S): Venofer  What should I tell my care team before I take this medication? They need to know if you have any of these conditions: Anemia not caused by low iron  levels Heart disease High levels of iron  in the blood Kidney disease Liver disease An unusual or allergic reaction to iron , other medications, foods, dyes, or preservatives Pregnant or trying to get pregnant Breastfeeding How should I use this medication? This medication is infused into a vein. It is given by your care team in a hospital or clinic setting. Talk to your care team about the use of this medication in children. While it may be prescribed for children as young as 2 years for selected conditions, precautions do apply. Overdosage: If you think you have taken too much of this medicine contact a poison control center or emergency room at once. NOTE: This medicine is only for you. Do not share this medicine with others. What if I miss a dose? Keep appointments for follow-up doses. It is important not to miss your dose. Call your care team if you are unable to keep an appointment. What may interact with this medication? Do not take this medication with any of the following: Deferoxamine Dimercaprol Other iron  products This medication may also interact with the following: Chloramphenicol Deferasirox This list may not describe all possible interactions. Give your health care provider a list of all the medicines, herbs, non-prescription drugs, or dietary supplements you use. Also tell them if you smoke,  drink alcohol, or use illegal drugs. Some items may interact with your medicine. What should I watch for while using this medication? Your condition will be monitored carefully while you are receiving this medication. Tell your care team if your symptoms do not start to get better or if they get worse. You may need blood work done while you are taking this medication. Sometimes, when medications are infused into veins, a little can leak out of the vein and into the tissue around it. If this medication leaks, it can cause a brown or dark stain on the skin. This is not common. It may be permanent. If you feel pain or swelling during your infusion, tell your care team right away. They can stop the infusion and treat the area. You may need to eat more foods that contain iron . Talk to your care team. Foods that contain iron  include whole grains or cereals, dried fruits, beans, peas, leafy green vegetables, and organ meats (liver, kidney). What side effects may I notice from receiving this medication? Side effects that you should report to your care team as soon as possible: Allergic reactions--skin rash, itching, hives, swelling of the face, lips, tongue, or throat Low blood pressure--dizziness, feeling faint or lightheaded, blurry vision Painful swelling, warmth, or redness of the skin, brown or dark skin color at the infusion site Shortness of breath Side effects that usually do not require medical attention (report these to your care team if they continue or are bothersome): Flushing Headache Joint pain Muscle pain Nausea This list may not describe all possible side effects. Call your  doctor for medical advice about side effects. You may report side effects to FDA at 1-800-FDA-1088. Where should I keep my medication? This medication is given in a hospital or clinic. It will not be stored at home. NOTE: This sheet is a summary. It may not cover all possible information. If you have questions about  this medicine, talk to your doctor, pharmacist, or health care provider.  2025 Elsevier/Gold Standard (2023-11-06 00:00:00)

## 2024-01-31 NOTE — Assessment & Plan Note (Addendum)
#  Iron  deficient anemia-hemoglobin ~9/ferritin-7 [FEB 2022; PCP]; patient symptomatic.  Poor tolerance to p.o. iron ; Today- Hb- 10.9 - proceed with IV iron  infusion today-recommend 3 more infusions.  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 NO  Capsule study.  Given the black-colored stool-if worsening or seeing any bright red blood recommend follow-up with PCP/GI.  # B12 low- pills once a day [PCP].    # DISPOSITION: # venofer  today # venofer  weekly x 3 more # Follow up in 3  months-MD; labs- cbc/bmp; b12 levels iron  studies;ferritin; -possible venofer - Dr.B

## 2024-02-07 ENCOUNTER — Inpatient Hospital Stay: Attending: Internal Medicine

## 2024-02-07 VITALS — BP 137/63 | HR 64 | Temp 96.5°F | Resp 18

## 2024-02-07 DIAGNOSIS — E611 Iron deficiency: Secondary | ICD-10-CM

## 2024-02-07 MED ORDER — IRON SUCROSE 20 MG/ML IV SOLN
200.0000 mg | Freq: Once | INTRAVENOUS | Status: AC
  Administered 2024-02-07: 200 mg via INTRAVENOUS
  Filled 2024-02-07: qty 10

## 2024-02-07 NOTE — Patient Instructions (Signed)
 Iron  Sucrose Injection What is this medication? IRON  SUCROSE (EYE ern SOO krose) treats low levels of iron  (iron  deficiency anemia) in people with kidney disease. Iron  is a mineral that plays an important role in making red blood cells, which carry oxygen from your lungs to the rest of your body. This medicine may be used for other purposes; ask your health care provider or pharmacist if you have questions. COMMON BRAND NAME(S): Venofer  What should I tell my care team before I take this medication? They need to know if you have any of these conditions: Anemia not caused by low iron  levels Heart disease High levels of iron  in the blood Kidney disease Liver disease An unusual or allergic reaction to iron , other medications, foods, dyes, or preservatives Pregnant or trying to get pregnant Breastfeeding How should I use this medication? This medication is infused into a vein. It is given by your care team in a hospital or clinic setting. Talk to your care team about the use of this medication in children. While it may be prescribed for children as young as 2 years for selected conditions, precautions do apply. Overdosage: If you think you have taken too much of this medicine contact a poison control center or emergency room at once. NOTE: This medicine is only for you. Do not share this medicine with others. What if I miss a dose? Keep appointments for follow-up doses. It is important not to miss your dose. Call your care team if you are unable to keep an appointment. What may interact with this medication? Do not take this medication with any of the following: Deferoxamine Dimercaprol Other iron  products This medication may also interact with the following: Chloramphenicol Deferasirox This list may not describe all possible interactions. Give your health care provider a list of all the medicines, herbs, non-prescription drugs, or dietary supplements you use. Also tell them if you smoke,  drink alcohol, or use illegal drugs. Some items may interact with your medicine. What should I watch for while using this medication? Your condition will be monitored carefully while you are receiving this medication. Tell your care team if your symptoms do not start to get better or if they get worse. You may need blood work done while you are taking this medication. Sometimes, when medications are infused into veins, a little can leak out of the vein and into the tissue around it. If this medication leaks, it can cause a brown or dark stain on the skin. This is not common. It may be permanent. If you feel pain or swelling during your infusion, tell your care team right away. They can stop the infusion and treat the area. You may need to eat more foods that contain iron . Talk to your care team. Foods that contain iron  include whole grains or cereals, dried fruits, beans, peas, leafy green vegetables, and organ meats (liver, kidney). What side effects may I notice from receiving this medication? Side effects that you should report to your care team as soon as possible: Allergic reactions--skin rash, itching, hives, swelling of the face, lips, tongue, or throat Low blood pressure--dizziness, feeling faint or lightheaded, blurry vision Painful swelling, warmth, or redness of the skin, brown or dark skin color at the infusion site Shortness of breath Side effects that usually do not require medical attention (report these to your care team if they continue or are bothersome): Flushing Headache Joint pain Muscle pain Nausea This list may not describe all possible side effects. Call your  doctor for medical advice about side effects. You may report side effects to FDA at 1-800-FDA-1088. Where should I keep my medication? This medication is given in a hospital or clinic. It will not be stored at home. NOTE: This sheet is a summary. It may not cover all possible information. If you have questions about  this medicine, talk to your doctor, pharmacist, or health care provider.  2025 Elsevier/Gold Standard (2023-11-06 00:00:00)

## 2024-02-14 ENCOUNTER — Inpatient Hospital Stay

## 2024-02-21 ENCOUNTER — Inpatient Hospital Stay

## 2024-05-01 ENCOUNTER — Inpatient Hospital Stay: Admitting: Internal Medicine

## 2024-05-01 ENCOUNTER — Inpatient Hospital Stay
